# Patient Record
Sex: Female | Born: 1995 | Race: White | Hispanic: No | Marital: Married | Smoking: Never smoker
Health system: Southern US, Community
[De-identification: ages and names within clinical notes are randomized; demographics above are authoritative.]

## PROBLEM LIST (undated history)

## (undated) ENCOUNTER — Inpatient Hospital Stay (HOSPITAL_COMMUNITY): Payer: Self-pay

## (undated) DIAGNOSIS — Z8489 Family history of other specified conditions: Secondary | ICD-10-CM

## (undated) DIAGNOSIS — T8859XA Other complications of anesthesia, initial encounter: Secondary | ICD-10-CM

## (undated) DIAGNOSIS — E782 Mixed hyperlipidemia: Secondary | ICD-10-CM

## (undated) DIAGNOSIS — E669 Obesity, unspecified: Secondary | ICD-10-CM

## (undated) DIAGNOSIS — O24419 Gestational diabetes mellitus in pregnancy, unspecified control: Secondary | ICD-10-CM

## (undated) DIAGNOSIS — R51 Headache: Secondary | ICD-10-CM

## (undated) DIAGNOSIS — Z98891 History of uterine scar from previous surgery: Secondary | ICD-10-CM

## (undated) DIAGNOSIS — N946 Dysmenorrhea, unspecified: Secondary | ICD-10-CM

## (undated) DIAGNOSIS — Z2839 Other underimmunization status: Secondary | ICD-10-CM

## (undated) DIAGNOSIS — G43909 Migraine, unspecified, not intractable, without status migrainosus: Secondary | ICD-10-CM

## (undated) DIAGNOSIS — B350 Tinea barbae and tinea capitis: Secondary | ICD-10-CM

## (undated) DIAGNOSIS — K219 Gastro-esophageal reflux disease without esophagitis: Secondary | ICD-10-CM

## (undated) DIAGNOSIS — J189 Pneumonia, unspecified organism: Secondary | ICD-10-CM

## (undated) DIAGNOSIS — T4145XA Adverse effect of unspecified anesthetic, initial encounter: Secondary | ICD-10-CM

## (undated) DIAGNOSIS — R011 Cardiac murmur, unspecified: Secondary | ICD-10-CM

## (undated) DIAGNOSIS — B019 Varicella without complication: Secondary | ICD-10-CM

## (undated) DIAGNOSIS — K8050 Calculus of bile duct without cholangitis or cholecystitis without obstruction: Secondary | ICD-10-CM

## (undated) DIAGNOSIS — O09899 Supervision of other high risk pregnancies, unspecified trimester: Secondary | ICD-10-CM

## (undated) HISTORY — DX: Gastro-esophageal reflux disease without esophagitis: K21.9

## (undated) HISTORY — PX: ADENOIDECTOMY: SUR15

## (undated) HISTORY — DX: Cardiac murmur, unspecified: R01.1

## (undated) HISTORY — PX: WISDOM TOOTH EXTRACTION: SHX21

## (undated) HISTORY — PX: OTHER SURGICAL HISTORY: SHX169

---
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
## 2015-03-13 ENCOUNTER — Encounter (HOSPITAL_COMMUNITY): Payer: Self-pay | Admitting: *Deleted

## 2015-03-13 ENCOUNTER — Emergency Department (HOSPITAL_COMMUNITY)
Admission: EM | Admit: 2015-03-13 | Discharge: 2015-03-14 | Disposition: A | Discharge status: Home / Self Care | Payer: Medicaid Other | Attending: Emergency Medicine | Admitting: Emergency Medicine

## 2015-03-13 DIAGNOSIS — Z8701 Personal history of pneumonia (recurrent): Secondary | ICD-10-CM | POA: Insufficient documentation

## 2015-03-13 DIAGNOSIS — Z880 Allergy status to penicillin: Secondary | ICD-10-CM | POA: Insufficient documentation

## 2015-03-13 DIAGNOSIS — W228XXA Striking against or struck by other objects, initial encounter: Secondary | ICD-10-CM | POA: Insufficient documentation

## 2015-03-13 DIAGNOSIS — Y9389 Activity, other specified: Secondary | ICD-10-CM | POA: Diagnosis not present

## 2015-03-13 DIAGNOSIS — Z8619 Personal history of other infectious and parasitic diseases: Secondary | ICD-10-CM | POA: Diagnosis not present

## 2015-03-13 DIAGNOSIS — Y9289 Other specified places as the place of occurrence of the external cause: Secondary | ICD-10-CM | POA: Insufficient documentation

## 2015-03-13 DIAGNOSIS — Z9622 Myringotomy tube(s) status: Secondary | ICD-10-CM | POA: Insufficient documentation

## 2015-03-13 DIAGNOSIS — Z79899 Other long term (current) drug therapy: Secondary | ICD-10-CM | POA: Diagnosis not present

## 2015-03-13 DIAGNOSIS — K219 Gastro-esophageal reflux disease without esophagitis: Secondary | ICD-10-CM | POA: Diagnosis not present

## 2015-03-13 DIAGNOSIS — H7291 Unspecified perforation of tympanic membrane, right ear: Secondary | ICD-10-CM | POA: Insufficient documentation

## 2015-03-13 DIAGNOSIS — Y998 Other external cause status: Secondary | ICD-10-CM | POA: Insufficient documentation

## 2015-03-13 DIAGNOSIS — S09301A Unspecified injury of right middle and inner ear, initial encounter: Secondary | ICD-10-CM | POA: Diagnosis present

## 2015-03-13 NOTE — ED Provider Notes (Signed)
CSN: [id number]     Arrival date & time 03/13/15  2253 History  By signing my name below, I, Doreatha Martin, attest that this documentation has been prepared under the direction and in the presence of Azalia Bilis, MD. Electronically Signed: Doreatha Martin, ED Scribe. 03/13/2015. 11:48 PM.    Chief Complaint  Patient presents with  . Ear Injury   The history is provided by the patient. No language interpreter was used.    HPI Comments: Martha Herrera is a 19 y.o. female who presents to the Emergency Department complaining of an injury to the right ear that occurred tonight. Pt states she was using a q-tip when she accidentally hit it with her hand, causing it to hit her ear drum. She states associated HA, right-sided otalgia, decreased hearing on the right. No other injuries.   Past Medical History  Diagnosis Date  . Gastroesophageal reflux   . Varicella   . Headache(784.0)     not as often  . Pneumonia 2013   Past Surgical History  Procedure Laterality Date  . Tubes in ears    . Wisdom tooth extraction    . Esophagogastroduodenoscopy  05/03/2012    Procedure: ESOPHAGOGASTRODUODENOSCOPY (EGD);  Surgeon: Jon Gills, MD;  Location: [facility] OR;  Service: Gastroenterology;  Laterality: N/A;   Family History  Problem Relation Age of Onset  . GER disease Father   . Asthma Brother   . Miscarriages / [place] Brother   . GER disease Maternal Aunt   . Migraines Maternal Aunt   . Anxiety disorder Maternal Aunt   . GER disease Maternal Grandmother   . Hyperlipidemia Maternal Grandmother   . ADD / ADHD Brother   . Miscarriages / [place] Mother   . Headache Mother   . Diabetes Paternal Aunt   . Cancer Maternal Grandfather   . Diabetes Maternal Grandfather   . Hyperlipidemia Maternal Grandfather   . Vision loss Maternal Grandfather   . Ataxia Neg Hx   . Chorea Neg Hx   . Dementia Neg Hx   . Mental retardation Neg Hx   . Multiple sclerosis Neg Hx   . Neurofibromatosis Neg Hx   .  Neuropathy Neg Hx   . Parkinsonism Neg Hx   . Seizures Neg Hx   . Stroke Neg Hx   . Depression Neg Hx   . Bipolar disorder Neg Hx    Social History  Substance Use Topics  . Smoking status: Never Smoker   . Smokeless tobacco: Never Used  . Alcohol Use: No   OB History    No data available     Review of Systems A complete 10 system review of systems was obtained and all systems are negative except as noted in the HPI and PMH.    Allergies  Penicillins  Home Medications   Prior to Admission medications   Medication Sig Start Date End Date Taking? Authorizing Provider  acetaminophen (TYLENOL) 500 MG tablet Take 1,000 mg by mouth every 6 (six) hours as needed.    Historical Provider, MD  Cholecalciferol 5000 UNITS capsule Take 5,000 Units by mouth daily.    Historical Provider, MD  diclofenac (VOLTAREN) 75 MG EC tablet Take 1 tablet (75 mg total) by mouth 2 (two) times daily. Patient not taking: Reported on 06/22/2014 02/02/14   Burgess Amor, PA-C  esomeprazole (NEXIUM) 20 MG capsule Take 20 mg by mouth daily before breakfast.    Historical Provider, MD  ibuprofen (ADVIL,MOTRIN) 200 MG tablet Take  200 mg by mouth every 6 (six) hours as needed for pain.    Historical Provider, MD  Magnesium Oxide 500 MG TABS Take by mouth.    Historical Provider, MD  norelgestromin-ethinyl estradiol (ORTHO EVRA) 150-35 MCG/24HR transdermal patch Place 1 patch onto the skin once a week. Then leave off for 1 week to have period 09/22/14   Cheral MarkerKimberly R Booker, CNM  pantoprazole (PROTONIX) 40 MG tablet Take 40 mg by mouth daily.    Historical Provider, MD  propranolol (INDERAL) 20 MG tablet Take 1 tablet (20 mg total) by mouth 2 (two) times daily. Patient not taking: Reported on 06/22/2014 07/16/13   Keturah Shaverseza Nabizadeh, MD  riboflavin (VITAMIN B-2) 100 MG TABS tablet Take 100 mg by mouth daily.    Historical Provider, MD  solifenacin (VESICARE) 10 MG tablet Take by mouth daily.    Historical Provider, MD  Vitamin D,  Ergocalciferol, (DRISDOL) 50000 UNITS CAPS capsule TAKE ONE CAPSULE BY MOUTH ONCE A WEEK 11/16/14   Historical Provider, MD   BP 134/80 mmHg  Pulse 88  Temp(Src) 98 F (36.7 C) (Oral)  Resp 18  Ht 4\' 10"  (1.473 m)  Wt 150 lb (68.04 kg)  BMI 31.36 kg/m2  SpO2 95%  LMP 03/06/2015 Physical Exam  Constitutional: She is oriented to person, place, and time. She appears well-developed and well-nourished.  HENT:  Head: Normocephalic.  Right Ear: Tympanic membrane is perforated.  Left Ear: Hearing, tympanic membrane, external ear and ear canal normal.  No active bleeding in the right ear. Punctate and perforation in the right TM.    Eyes: EOM are normal.  Neck: Normal range of motion.  Pulmonary/Chest: Effort normal.  Abdominal: She exhibits no distension.  Musculoskeletal: Normal range of motion.  Neurological: She is alert and oriented to person, place, and time.  Psychiatric: She has a normal mood and affect.  Nursing note and vitals reviewed.  ED Course  Procedures (including critical care time) DIAGNOSTIC STUDIES: Oxygen Saturation is 95% on RA, adequate by my interpretation.    COORDINATION OF CARE: 11:46 PM Discussed treatment plan with pt at bedside and pt agreed to plan.   MDM   Final diagnoses:  Tympanic membrane perforation, right    Ruptured right TM. Audiology and ENT follow up. Told to not submerge head and told to wear a waterproof ear plug when showering  I personally performed the services described in this documentation, which was scribed in my presence. The recorded information has been reviewed and is accurate.       Azalia BilisKevin Nikol Lemar, MD 03/14/15 [phone]

## 2015-03-13 NOTE — Discharge Instructions (Signed)
Eardrum Perforation  An eardrum perforation is a puncture or tear in the eardrum. This is also called a ruptured eardrum. The eardrum is a thin, round tissue inside of your ear that separates your ear canal from your middle ear. This is the tissue that detects sound and enables you to hear. An eardrum perforation can cause discomfort and hearing loss. In most cases, the eardrum will heal without treatment and with little or no permanent hearing loss.  CAUSES  An eardrum perforation can result from different causes, including:  · Sudden pressure changes that happen in situations such as scuba diving or flying in an airplane.  · Foreign objects in the ear.  · Inserting a cotton-tipped swab or any blunt object into the ear.  · Loud noise.  · Trauma to the ear.  · Attempting to remove an object from the ear.  SIGNS AND SYMPTOMS  · Hearing loss.  · Ear pain.  · Ringing in the ear.  · Discharge or bleeding from the ear.  · Dizziness.  · Vomiting.  · Facial paralysis.  DIAGNOSIS   Your health care provider will examine your ear using a tool called an otoscope. This tool allows the health care provider to see into your ear to examine your eardrum. Various types of hearing tests may also be done.  TREATMENT   Typically, the eardrum will heal on its own within a few weeks. If your eardrum does not heal, your health care provider may recommend one of the following treatments:  · A procedure to place a patch over your eardrum.  · Surgery to repair your eardrum.  HOME CARE INSTRUCTIONS   · Keep your ear dry. This will improve healing. Do not submerge your head under water until healing is complete. Do not swim or dive until your health care provider approves. While bathing or showering, protect your ear using one of these methods:    Using a waterproof earplug.    Covering a piece of cotton with petroleum jelly and placing it in your outer ear canal.  · Take medicines only as directed by your health care provider.  · Avoid  blowing your nose if possible. If you blow your nose, do it gently. Forceful blowing increases the pressure in your middle ear. This may cause further injury or may delay your healing.  · Resume your normal activities after the perforation has healed. Your health care provider can tell you when this has occurred.  · Talk to your health care provider before you fly on an airplane. Air travel is generally allowed with a perforated eardrum.  · Keep all follow-up visits as directed by your health care provider. This is important.  SEEK MEDICAL CARE IF:  · You have a fever.  SEEK IMMEDIATE MEDICAL CARE IF:  · You have blood or pus coming from your ear.  · You have dizziness or problems with balance.  · You have nausea or vomiting.  · You have increased pain.     This information is not intended to replace advice given to you by your health care provider. Make sure you discuss any questions you have with your health care provider.     Document Released: 04/14/2000 Document Revised: 05/08/2014 Document Reviewed: 11/24/2013  Elsevier Interactive Patient Education ©2016 [employer]

## 2015-03-13 NOTE — ED Notes (Signed)
Pt was cleaning her ear out and poked the q-tip in her ear and now her ear is bleeding.

## 2015-05-13 ENCOUNTER — Ambulatory Visit (INDEPENDENT_AMBULATORY_CARE_PROVIDER_SITE_OTHER): Payer: Medicaid Other | Admitting: Otolaryngology

## 2015-05-13 DIAGNOSIS — H93293 Other abnormal auditory perceptions, bilateral: Secondary | ICD-10-CM | POA: Diagnosis not present

## 2015-08-21 ENCOUNTER — Encounter (HOSPITAL_COMMUNITY): Payer: Self-pay

## 2015-08-21 ENCOUNTER — Emergency Department (HOSPITAL_COMMUNITY)
Admission: EM | Admit: 2015-08-21 | Discharge: 2015-08-21 | Disposition: A | Discharge status: Home / Self Care | Payer: Medicaid Other | Attending: Emergency Medicine | Admitting: Emergency Medicine

## 2015-08-21 DIAGNOSIS — L259 Unspecified contact dermatitis, unspecified cause: Secondary | ICD-10-CM | POA: Diagnosis not present

## 2015-08-21 DIAGNOSIS — R21 Rash and other nonspecific skin eruption: Secondary | ICD-10-CM | POA: Diagnosis present

## 2015-08-21 MED ORDER — DIPHENHYDRAMINE HCL 25 MG PO TABS: 25.0000 mg | ORAL_TABLET | Freq: Four times a day (QID) | ORAL | Status: DC

## 2015-08-21 MED ORDER — PREDNISONE 10 MG PO TABS: ORAL_TABLET | ORAL | Status: DC

## 2015-08-21 MED ORDER — DIPHENHYDRAMINE HCL 25 MG PO CAPS
25.0000 mg | ORAL_CAPSULE | Freq: Once | ORAL | Status: AC
  Administered 2015-08-21: 25 mg via ORAL
  Filled 2015-08-21: qty 1

## 2015-08-21 MED ORDER — TRIAMCINOLONE ACETONIDE 0.1 % EX CREA
1.0000 | TOPICAL_CREAM | Freq: Two times a day (BID) | CUTANEOUS | Status: DC
Start: 2015-08-21 — End: 2016-10-24

## 2015-08-21 MED ORDER — PREDNISONE 50 MG PO TABS
60.0000 mg | ORAL_TABLET | Freq: Once | ORAL | Status: AC
  Administered 2015-08-21: 60 mg via ORAL
  Filled 2015-08-21: qty 1

## 2015-08-21 NOTE — ED Provider Notes (Signed)
CSN: [id number]     Arrival date & time 08/21/15  2300 History   First MD Initiated Contact with Patient 08/21/15 2316     Chief Complaint  Patient presents with  . Rash     (Consider location/radiation/quality/duration/timing/severity/associated sxs/prior Treatment) Patient is a 20 y.o. female presenting with rash. The history is provided by the patient.  Rash Location:  Shoulder/arm and torso Shoulder/arm rash location:  R arm Torso rash location:  R chest Quality: blistering, itchiness and redness   Severity:  Mild Onset quality:  Gradual Duration:  4 days Timing:  Intermittent Progression:  Unchanged Context: plant contact   Relieved by:  Nothing Worsened by:  Nothing tried Associated symptoms: no hoarse voice, no nausea, no shortness of breath, no throat swelling, no tongue swelling and not wheezing     Past Medical History  Diagnosis Date  . Gastroesophageal reflux   . Varicella   . Headache(784.0)     not as often  . Pneumonia 2013   Past Surgical History  Procedure Laterality Date  . Tubes in ears    . Wisdom tooth extraction    . Esophagogastroduodenoscopy  05/03/2012    Procedure: ESOPHAGOGASTRODUODENOSCOPY (EGD);  Surgeon: Jon Gills, MD;  Location: [facility] OR;  Service: Gastroenterology;  Laterality: N/A;   Family History  Problem Relation Age of Onset  . GER disease Father   . Asthma Brother   . Miscarriages / [place] Brother   . GER disease Maternal Aunt   . Migraines Maternal Aunt   . Anxiety disorder Maternal Aunt   . GER disease Maternal Grandmother   . Hyperlipidemia Maternal Grandmother   . ADD / ADHD Brother   . Miscarriages / [place] Mother   . Headache Mother   . Diabetes Paternal Aunt   . Cancer Maternal Grandfather   . Diabetes Maternal Grandfather   . Hyperlipidemia Maternal Grandfather   . Vision loss Maternal Grandfather   . Ataxia Neg Hx   . Chorea Neg Hx   . Dementia Neg Hx   . Mental retardation Neg Hx   . Multiple  sclerosis Neg Hx   . Neurofibromatosis Neg Hx   . Neuropathy Neg Hx   . Parkinsonism Neg Hx   . Seizures Neg Hx   . Stroke Neg Hx   . Depression Neg Hx   . Bipolar disorder Neg Hx    Social History  Substance Use Topics  . Smoking status: Never Smoker   . Smokeless tobacco: Never Used  . Alcohol Use: No   OB History    No data available     Review of Systems  HENT: Negative for hoarse voice.   Respiratory: Negative for shortness of breath and wheezing.   Gastrointestinal: Negative for nausea.  Skin: Positive for rash.  All other systems reviewed and are negative.     Allergies  Penicillins and Amoxil  Home Medications   Prior to Admission medications   Medication Sig Start Date End Date Taking? Authorizing Provider  acetaminophen (TYLENOL) 500 MG tablet Take 1,000 mg by mouth every 6 (six) hours as needed.    Historical Provider, MD  Cholecalciferol 5000 UNITS capsule Take 5,000 Units by mouth daily.    Historical Provider, MD  diclofenac (VOLTAREN) 75 MG EC tablet Take 1 tablet (75 mg total) by mouth 2 (two) times daily. Patient not taking: Reported on 06/22/2014 02/02/14   Burgess Amor, PA-C  diphenhydrAMINE (BENADRYL) 25 MG tablet Take 1 tablet (25 mg total) by mouth  every 6 (six) hours. 08/21/15   Ivery QualeHobson Taiana Temkin, PA-C  esomeprazole (NEXIUM) 20 MG capsule Take 20 mg by mouth daily before breakfast.    Historical Provider, MD  ibuprofen (ADVIL,MOTRIN) 200 MG tablet Take 200 mg by mouth every 6 (six) hours as needed for pain.    Historical Provider, MD  Magnesium Oxide 500 MG TABS Take by mouth.    Historical Provider, MD  norelgestromin-ethinyl estradiol (ORTHO EVRA) 150-35 MCG/24HR transdermal patch Place 1 patch onto the skin once a week. Then leave off for 1 week to have period 09/22/14   Cheral MarkerKimberly R Booker, CNM  pantoprazole (PROTONIX) 40 MG tablet Take 40 mg by mouth daily.    Historical Provider, MD  predniSONE (DELTASONE) 10 MG tablet 5,4,3,2,1 - Take with food. 08/21/15    Ivery QualeHobson Clemmie Marxen, PA-C  propranolol (INDERAL) 20 MG tablet Take 1 tablet (20 mg total) by mouth 2 (two) times daily. Patient not taking: Reported on 06/22/2014 07/16/13   Keturah Shaverseza Nabizadeh, MD  riboflavin (VITAMIN B-2) 100 MG TABS tablet Take 100 mg by mouth daily.    Historical Provider, MD  solifenacin (VESICARE) 10 MG tablet Take by mouth daily.    Historical Provider, MD  triamcinolone cream (KENALOG) 0.1 % Apply 1 application topically 2 (two) times daily. 08/21/15   Ivery QualeHobson Jamarkis Branam, PA-C  Vitamin D, Ergocalciferol, (DRISDOL) 50000 UNITS CAPS capsule TAKE ONE CAPSULE BY MOUTH ONCE A WEEK 11/16/14   Historical Provider, MD   BP 118/80 mmHg  Pulse 91  Temp(Src) 98.1 F (36.7 C) (Oral)  Resp 16  Ht 4\' 10"  (1.473 m)  Wt 68.947 kg  BMI 31.78 kg/m2  SpO2 100%  LMP 08/17/2015 (Exact Date) Physical Exam  Constitutional: She is oriented to person, place, and time. She appears well-developed and well-nourished.  Non-toxic appearance.  HENT:  Head: Normocephalic.  Right Ear: Tympanic membrane and external ear normal.  Left Ear: Tympanic membrane and external ear normal.  Eyes: EOM and lids are normal. Pupils are equal, round, and reactive to light.  Neck: Normal range of motion. Neck supple. Carotid bruit is not present.  Cardiovascular: Normal rate, regular rhythm, normal heart sounds, intact distal pulses and normal pulses.   Pulmonary/Chest: Breath sounds normal. No respiratory distress.    Red rash of the right breast. No cluster of blisters. No red streaks. No drainage. No drainage from the right nipple are. Chaperone present during the exam.  Abdominal: Soft. Bowel sounds are normal. There is no tenderness. There is no guarding.  Musculoskeletal: Normal range of motion.  Lymphadenopathy:       Head (right side): No submandibular adenopathy present.       Head (left side): No submandibular adenopathy present.    She has no cervical adenopathy.  Neurological: She is alert and oriented to  person, place, and time. She has normal strength. No cranial nerve deficit or sensory deficit.  Skin: Skin is warm and dry.  Psychiatric: She has a normal mood and affect. Her speech is normal.  Nursing note and vitals reviewed.   ED Course  Procedures (including critical care time) Labs Review Labs Reviewed - No data to display  Imaging Review No results found. I have personally reviewed and evaluated these images and lab results as part of my medical decision-making.   EKG Interpretation None      MDM  Vital signs stable. Exam favors contact dermatitis. No evidence for anaphylaxis.  Rx for Kenalog cream, prednisone and benadryl given to the patient. Pt to  see PCP or return to the ED if any changes or problem.   Final diagnoses:  Contact dermatitis    *I have reviewed nursing notes, vital signs, and all appropriate lab and imaging results for this patient.[address] PA-C 08/23/15 1126  Shon Baton, MD 08/24/15 2259

## 2015-08-21 NOTE — ED Notes (Signed)
Started breaking out in a rash on Tuesday.  Breaking out on arms and on right breast.  Blisters noted to right arm.  Itches per pt.  I have been using neosporin, hydrocortisone cream, calamine lotion, and taking oral benadryl.

## 2015-08-21 NOTE — Discharge Instructions (Signed)

## 2015-10-27 ENCOUNTER — Other Ambulatory Visit (HOSPITAL_COMMUNITY): Payer: Self-pay | Admitting: Physician Assistant

## 2015-10-27 DIAGNOSIS — R102 Pelvic and perineal pain: Secondary | ICD-10-CM

## 2015-10-29 ENCOUNTER — Other Ambulatory Visit (HOSPITAL_COMMUNITY): Payer: Self-pay | Admitting: Physician Assistant

## 2015-10-29 DIAGNOSIS — R102 Pelvic and perineal pain: Secondary | ICD-10-CM

## 2015-11-01 ENCOUNTER — Other Ambulatory Visit (HOSPITAL_COMMUNITY): Payer: Self-pay | Admitting: Physician Assistant

## 2015-11-01 DIAGNOSIS — R102 Pelvic and perineal pain: Secondary | ICD-10-CM

## 2015-11-09 ENCOUNTER — Ambulatory Visit (HOSPITAL_COMMUNITY): Admission: RE | Admit: 2015-11-09 | Payer: Medicaid Other | Source: Ambulatory Visit

## 2016-06-22 ENCOUNTER — Emergency Department (HOSPITAL_COMMUNITY): Payer: 59

## 2016-06-22 ENCOUNTER — Encounter (HOSPITAL_COMMUNITY): Payer: Self-pay

## 2016-06-22 ENCOUNTER — Emergency Department (HOSPITAL_COMMUNITY)
Admission: EM | Admit: 2016-06-22 | Discharge: 2016-06-22 | Disposition: A | Discharge status: Home / Self Care | Payer: 59 | Attending: Emergency Medicine | Admitting: Emergency Medicine

## 2016-06-22 DIAGNOSIS — Z5181 Encounter for therapeutic drug level monitoring: Secondary | ICD-10-CM | POA: Insufficient documentation

## 2016-06-22 DIAGNOSIS — R519 Headache, unspecified: Secondary | ICD-10-CM

## 2016-06-22 DIAGNOSIS — G43909 Migraine, unspecified, not intractable, without status migrainosus: Secondary | ICD-10-CM | POA: Diagnosis present

## 2016-06-22 DIAGNOSIS — R200 Anesthesia of skin: Secondary | ICD-10-CM

## 2016-06-22 DIAGNOSIS — R51 Headache: Secondary | ICD-10-CM

## 2016-06-22 LAB — COMPREHENSIVE METABOLIC PANEL
ALK PHOS: 95 U/L (ref 38–126)
ALT: 19 U/L (ref 14–54)
ANION GAP: 9 (ref 5–15)
AST: 22 U/L (ref 15–41)
Albumin: 4.8 g/dL (ref 3.5–5.0)
BUN: 17 mg/dL (ref 6–20)
CALCIUM: 9.6 mg/dL (ref 8.9–10.3)
CO2: 27 mmol/L (ref 22–32)
Chloride: 101 mmol/L (ref 101–111)
Creatinine, Ser: 0.65 mg/dL (ref 0.44–1.00)
GFR calc Af Amer: >60 mL/min (ref >60)
GFR calc non Af Amer: >60 mL/min (ref >60)
Glucose, Bld: 114 mg/dL — ABNORMAL HIGH (ref 65–99)
Potassium: 3.5 mmol/L (ref 3.5–5.1)
SODIUM: 137 mmol/L (ref 135–145)
TOTAL PROTEIN: 8 g/dL (ref 6.5–8.1)
Total Bilirubin: 0.5 mg/dL (ref 0.3–1.2)

## 2016-06-22 LAB — CBC WITH DIFFERENTIAL/PLATELET
Basophils Absolute: 0 10*3/uL (ref 0.0–0.1)
Basophils Relative: 0 %
EOS ABS: 0 10*3/uL (ref 0.0–0.7)
EOS PCT: 0 %
HCT: 44.6 % (ref 36.0–46.0)
HEMOGLOBIN: 15.4 g/dL — AB (ref 12.0–15.0)
LYMPHS ABS: 2.3 10*3/uL (ref 0.7–4.0)
Lymphocytes Relative: 15 %
MCH: 28.9 pg (ref 26.0–34.0)
MCHC: 34.5 g/dL (ref 30.0–36.0)
MCV: 83.7 fL (ref 78.0–100.0)
MONO ABS: 0.5 10*3/uL (ref 0.1–1.0)
MONOS PCT: 3 %
NEUTROS PCT: 82 %
Neutro Abs: 13 10*3/uL — ABNORMAL HIGH (ref 1.7–7.7)
Platelets: 319 10*3/uL (ref 150–400)
RBC: 5.33 MIL/uL — ABNORMAL HIGH (ref 3.87–5.11)
RDW: 12.4 % (ref 11.5–15.5)
WBC: 15.8 10*3/uL — ABNORMAL HIGH (ref 4.0–10.5)

## 2016-06-22 LAB — PROTIME-INR
INR: 0.98
Prothrombin Time: 13 seconds (ref 11.4–15.2)

## 2016-06-22 LAB — APTT: aPTT: 29 seconds (ref 24–36)

## 2016-06-22 MED ORDER — DIPHENHYDRAMINE HCL 50 MG/ML IJ SOLN
25.0000 mg | Freq: Once | INTRAMUSCULAR | Status: AC
  Administered 2016-06-22: 25 mg via INTRAVENOUS
  Filled 2016-06-22: qty 1

## 2016-06-22 MED ORDER — SODIUM CHLORIDE 0.9 % IV BOLUS (SEPSIS)
1000.0000 mL | Freq: Once | INTRAVENOUS | Status: AC
  Administered 2016-06-22: 1000 mL via INTRAVENOUS

## 2016-06-22 MED ORDER — METOCLOPRAMIDE HCL 5 MG/ML IJ SOLN
10.0000 mg | Freq: Once | INTRAMUSCULAR | Status: AC
  Administered 2016-06-22: 10 mg via INTRAVENOUS
  Filled 2016-06-22: qty 2

## 2016-06-22 NOTE — ED Notes (Signed)
Pt c/o itching to right eye and states that she sees " a black line" on right outside field of vision, denies any injury, pt and family updated on plan of care,

## 2016-06-22 NOTE — ED Notes (Signed)
Pt and family updated on plan of care,  

## 2016-06-22 NOTE — Discharge Instructions (Signed)
Your numbness tonight was from a complicated migraine episode. Your MR tests were all normal, no strokes, blood clots, abnormal blood vessels or aneurysms.  You should see a neurologist to get on medications to control your migraine headaches. Look at the migraine information sheet to see if they are things in your diet or your environment that you can avoid to help less the migraine attacks.

## 2016-06-22 NOTE — ED Notes (Signed)
SOC notified 

## 2016-06-22 NOTE — ED Notes (Signed)
ED Provider at bedside. 

## 2016-06-22 NOTE — ED Triage Notes (Signed)
Pt states she got off of work approx 2230 and while driving her vision was blurry and has periods of 'darkness" also with pain in right eye.

## 2016-06-22 NOTE — ED Provider Notes (Signed)
[facility]-EMERGENCY DEPT Provider Note   CSN: [id number] Arrival date & time: 06/22/16  0003  Time seen 04:30 AM   History   Chief Complaint Chief Complaint  Patient presents with  . Blurred Vision    HPI Martha Herrera is a 21 y.o. female.  HPI  patient states she worked at [employer] last night and got off work about 10:30 PM. She states she was wearing her glasses. As she was leaving she looked at her phone and she states the letter saw on her phone were blurred. She states she has blurred vision that comes and goes however this seems worse. She also states there is a small black dot that she sees in her right eye that's been there for a month. She states she tried to drive home but she couldn't see to drive her boyfriend drove her home. She states her vision got worse and she can only see colors. She also reports a right-sided headache behind her right eye that started about 11 PM. She states she gets daily headaches. This is like headache she's had before however this time she also reports some numbness in her left face, left arm, left leg, her left tongue that lasted about 10-20 minutes. She denies any difficulty speaking or walking during this episode. She denies been on birth control pills. She reports the numbness is totally gone now. She still has a headache.  She reports family history and a maternal aunt and a sister of headaches, there is no family history of strokes.  PCP none  Past Medical History:  Diagnosis Date  . Gastroesophageal reflux   . Headache(784.0)    not as often  . Pneumonia 2013  . Varicella     Patient Active Problem List   Diagnosis Date Noted  . Tension headache 01/13/2013  . Migraine without aura 01/13/2013  . 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 Right sided abdominal pain 03/12/2012  . Substernal chest pain 03/12/2012  . Gastroesophageal reflux     Past Surgical History:  Procedure Laterality Date  . ESOPHAGOGASTRODUODENOSCOPY  05/03/2012   Procedure: ESOPHAGOGASTRODUODENOSCOPY (EGD);  Surgeon: Jon Gills, MD;  Location: [facility] OR;  Service: Gastroenterology;  Laterality: N/A;  . tubes in ears    . WISDOM TOOTH EXTRACTION      OB History    No data available       Home Medications    Prior to Admission medications   Medication Sig Start Date End Date Taking? Authorizing Provider  acetaminophen (TYLENOL) 500 MG tablet Take 1,000 mg by mouth every 6 (six) hours as needed.   Yes Historical Provider, MD  Cholecalciferol 5000 UNITS capsule Take 5,000 Units by mouth daily.    Historical Provider, MD  diclofenac (VOLTAREN) 75 MG EC tablet Take 1 tablet (75 mg total) by mouth 2 (two) times daily. Patient not taking: Reported on 06/22/2014 02/02/14   Burgess Amor, PA-C  diphenhydrAMINE (BENADRYL) 25 MG tablet Take 1 tablet (25 mg total) by mouth every 6 (six) hours. 08/21/15   Ivery Quale, PA-C  esomeprazole (NEXIUM) 20 MG capsule Take 20 mg by mouth daily before breakfast.    Historical Provider, MD  ibuprofen (ADVIL,MOTRIN) 200 MG tablet Take 200 mg by mouth every 6 (six) hours as needed for pain.    Historical Provider, MD  Magnesium Oxide 500 MG TABS Take by mouth.    Historical Provider, MD  norelgestromin-ethinyl estradiol (ORTHO EVRA) 150-35 MCG/24HR transdermal patch Place 1 patch onto the skin once  a week. Then leave off for 1 week to have period 09/22/14   Cheral MarkerKimberly R Booker, CNM  pantoprazole (PROTONIX) 40 MG tablet Take 40 mg by mouth daily.    Historical Provider, MD  predniSONE (DELTASONE) 10 MG tablet 5,4,3,2,1 - Take with food. 08/21/15   Ivery QualeHobson Bryant, PA-C  propranolol (INDERAL) 20 MG tablet Take 1 tablet (20 mg total) by mouth 2 (two) times daily. Patient not taking: Reported on 06/22/2014 07/16/13   Keturah Shaverseza Nabizadeh, MD  riboflavin (VITAMIN B-2) 100 MG TABS tablet Take 100 mg by mouth daily.    Historical Provider, MD  solifenacin (VESICARE) 10 MG tablet Take by mouth daily.    Historical Provider, MD  triamcinolone cream  (KENALOG) 0.1 % Apply 1 application topically 2 (two) times daily. 08/21/15   Ivery QualeHobson Bryant, PA-C  Vitamin D, Ergocalciferol, (DRISDOL) 50000 UNITS CAPS capsule TAKE ONE CAPSULE BY MOUTH ONCE A WEEK 11/16/14   Historical Provider, MD    Family History Family History  Problem Relation Age of Onset  . GER disease Father   . Asthma Brother   . Miscarriages / [place] Brother   . GER disease Maternal Grandmother   . Hyperlipidemia Maternal Grandmother   . ADD / ADHD Brother   . Miscarriages / [place] Mother   . Headache Mother   . Cancer Maternal Grandfather   . Diabetes Maternal Grandfather   . Hyperlipidemia Maternal Grandfather   . Vision loss Maternal Grandfather   . GER disease Maternal Aunt   . Migraines Maternal Aunt   . Anxiety disorder Maternal Aunt   . Diabetes Paternal Aunt   . Ataxia Neg Hx   . Chorea Neg Hx   . Dementia Neg Hx   . Mental retardation Neg Hx   . Multiple sclerosis Neg Hx   . Neurofibromatosis Neg Hx   . Neuropathy Neg Hx   . Parkinsonism Neg Hx   . Seizures Neg Hx   . Stroke Neg Hx   . Depression Neg Hx   . Bipolar disorder Neg Hx     Social History Social History  Substance Use Topics  . Smoking status: Never Smoker  . Smokeless tobacco: Never Used  . Alcohol use No  employed   Allergies   Penicillins and Amoxil [amoxicillin]   Review of Systems Review of Systems  All other systems reviewed and are negative.    Physical Exam Updated Vital Signs BP 108/76   Pulse 75   Temp 98 F (36.7 C) (Oral)   Resp 20   Ht 4\' 10"  (1.473 m)   Wt 162 lb (73.5 kg)   LMP 06/09/2016   SpO2 98%   BMI 33.86 kg/m   Vital signs normal    Physical Exam  Constitutional: She is oriented to person, place, and time. She appears well-developed and well-nourished.  Non-toxic appearance. She does not appear ill. No distress.  HENT:  Head: Normocephalic and atraumatic.  Right Ear: External ear normal.  Left Ear: External ear normal.  Nose:  Nose normal. No mucosal edema or rhinorrhea.  Mouth/Throat: Oropharynx is clear and moist and mucous membranes are normal. No dental abscesses or uvula swelling.  Eyes: Conjunctivae and EOM are normal. Pupils are equal, round, and reactive to light.  Neck: Normal range of motion and full passive range of motion without pain. Neck supple.  Cardiovascular: Normal rate, regular rhythm and normal heart sounds.  Exam reveals no gallop and no friction rub.   No murmur heard. Pulmonary/Chest: Effort normal  and breath sounds normal. No respiratory distress. She has no wheezes. She has no rhonchi. She has no rales. She exhibits no tenderness and no crepitus.  Abdominal: Soft. Normal appearance and bowel sounds are normal. She exhibits no distension. There is no tenderness. There is no rebound and no guarding.  Musculoskeletal: Normal range of motion. She exhibits no edema or tenderness.  Moves all extremities well.   Neurological: She is alert and oriented to person, place, and time. She has normal strength. No cranial nerve deficit.  Sensation is intact to light touch bilaterally, she does not have pronator drift, grips are equal, there is no lower motor weakness noted.  Skin: Skin is warm, dry and intact. No rash noted. No erythema. No pallor.  Psychiatric: She has a normal mood and affect. Her speech is normal and behavior is normal. Her mood appears not anxious.  Nursing note and vitals reviewed.    ED Treatments / Results  Labs (all labs ordered are listed, but only abnormal results are displayed) Results for orders placed or performed during the hospital encounter of 06/22/16  Protime-INR  Result Value Ref Range   Prothrombin Time 13.0 11.4 - 15.2 seconds   INR 0.98   CBC with Differential  Result Value Ref Range   WBC 15.8 (H) 4.0 - 10.5 K/uL   RBC 5.33 (H) 3.87 - 5.11 MIL/uL   Hemoglobin 15.4 (H) 12.0 - 15.0 g/dL   HCT 16.1 09.6 - 04.5 %   MCV 83.7 78.0 - 100.0 fL   MCH 28.9 26.0 -  34.0 pg   MCHC 34.5 30.0 - 36.0 g/dL   RDW 40.9 81.1 - 91.4 %   Platelets 319 150 - 400 K/uL   Neutrophils Relative % 82 %   Neutro Abs 13.0 (H) 1.7 - 7.7 K/uL   Lymphocytes Relative 15 %   Lymphs Abs 2.3 0.7 - 4.0 K/uL   Monocytes Relative 3 %   Monocytes Absolute 0.5 0.1 - 1.0 K/uL   Eosinophils Relative 0 %   Eosinophils Absolute 0.0 0.0 - 0.7 K/uL   Basophils Relative 0 %   Basophils Absolute 0.0 0.0 - 0.1 K/uL  Comprehensive metabolic panel  Result Value Ref Range   Sodium 137 135 - 145 mmol/L   Potassium 3.5 3.5 - 5.1 mmol/L   Chloride 101 101 - 111 mmol/L   CO2 27 22 - 32 mmol/L   Glucose, Bld 114 (H) 65 - 99 mg/dL   BUN 17 6 - 20 mg/dL   Creatinine, Ser 7.82 0.44 - 1.00 mg/dL   Calcium 9.6 8.9 - 95.6 mg/dL   Total Protein 8.0 6.5 - 8.1 g/dL   Albumin 4.8 3.5 - 5.0 g/dL   AST 22 15 - 41 U/L   ALT 19 14 - 54 U/L   Alkaline Phosphatase 95 38 - 126 U/L   Total Bilirubin 0.5 0.3 - 1.2 mg/dL   GFR calc non Af Amer >60 >60 mL/min   GFR calc Af Amer >60 >60 mL/min   Anion gap 9 5 - 15  APTT  Result Value Ref Range   aPTT 29 24 - 36 seconds    Laboratory interpretation all normal except Leukocytosis and mild elevation hemoglobin suspicious for dehydration   EKG  EKG Interpretation None       Radiology Ct Head Wo Contrast  Result Date: 06/22/2016 CLINICAL DATA:  Blurry vision and right-sided headache. EXAM: CT HEAD WITHOUT CONTRAST TECHNIQUE: Contiguous axial images were obtained from the base  of the skull through the vertex without intravenous contrast. COMPARISON:  None. FINDINGS: Brain: No mass lesion, intraparenchymal hemorrhage or extra-axial collection. No evidence of acute cortical infarct. Brain parenchyma and CSF-containing spaces are normal for age. Vascular: No hyperdense vessel or unexpected calcification. Skull: Normal visualized skull base, calvarium and extracranial soft tissues. Sinuses/Orbits: No sinus fluid levels or advanced mucosal thickening. No  mastoid effusion. Normal orbits. IMPRESSION: Normal head CT. Electronically Signed   By: Deatra Robinson M.D.   On: 06/22/2016 05:21   Mr Angiogram Head Wo Contrast  Result Date: 06/22/2016 CLINICAL DATA:  New onset headache and left-sided numbness. EXAM: MRA HEAD WITHOUT CONTRAST TECHNIQUE: Angiographic images of the Circle of Willis were obtained using MRA technique without intravenous contrast. COMPARISON:  MRI brain from the same day. CT head from the same day. FINDINGS: The internal carotid arteries are within normal limits from the high cervical segments through the ICA termini bilaterally. The A1 and M1 segments are normal. The anterior communicating artery is patent MCA bifurcations are intact. ACA and MCA branch vessels are unremarkable. The right vertebral artery is slightly dominant the left. The left PICA origin below the field of view. The right PICA origin is visualized and normal. The vertebrobasilar junction and basilar artery are normal the both posterior cerebral arteries originate from the basilar tip. The PCA branch vessels are within normal limits. IMPRESSION: 1. Normal MRA circle of Willis without significant proximal stenosis, aneurysm, or branch vessel occlusion. Electronically Signed   By: Marin Roberts M.D.   On: 06/22/2016 07:43   Mr Brain Wo Contrast  Result Date: 06/22/2016 CLINICAL DATA:  New onset headache and left-sided numbness. EXAM: MRI HEAD WITHOUT CONTRAST TECHNIQUE: Multiplanar, multiecho pulse sequences of the brain and surrounding structures were obtained without intravenous contrast. COMPARISON:  MRA and MR venogram of the same day. FINDINGS: Brain: No acute infarct, hemorrhage, or mass lesion is present. The ventricles are of normal size. No significant extraaxial fluid collection is present. No significant white matter disease is present. The internal auditory canals are normal. Brainstem and cerebellum are unremarkable. Vascular: Flow is present in the major  intracranial arteries. Skull and upper cervical spine: The skullbase is within normal limits. 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 There is no obstruction. The craniocervical junction is normal. Midline sagittal structures are within normal limits for age. Sinuses/Orbits: The paranasal sinuses and mastoid air cells are clear. The globes and orbits are within normal limits. IMPRESSION: Normal MRI of the brain. Electronically Signed   By: Marin Roberts M.D.   On: 06/22/2016 07:46   Mr Mrv Head Wo Cm  Result Date: 06/22/2016 CLINICAL DATA:  New onset headache and left-sided numbness. EXAM: MR VENOGRAM the HEAD WITHOUT CONTRAST TECHNIQUE: Angiographic images of the intracranial venous structures were obtained using MRV technique without intravenous contrast. COMPARISON:  MRI brain the same day. FINDINGS: The dural sinuses are patent. The left transverse sinus is dominant. The straight sinus and deep cerebral veins are intact. Cortical veins are within normal limits. IMPRESSION: The negative MR venogram. Electronically Signed   By: Marin Roberts M.D.   On: 06/22/2016 07:41    Procedures Procedures (including critical care time)  Medications Ordered in ED Medications  sodium chloride 0.9 % bolus 1,000 mL (0 mLs Intravenous Stopped 06/22/16 0612)  metoCLOPramide (REGLAN) injection 10 mg (10 mg Intravenous Given 06/22/16 0455)  diphenhydrAMINE (BENADRYL) injection 25 mg (25 mg Intravenous Given 06/22/16 0455)     Initial Impression / Assessment  and Plan / ED Course  I have reviewed the triage vital signs and the nursing notes.  Pertinent labs & imaging results that were available during my care of the patient were reviewed by me and considered in my medical decision making (see chart for details).  When I saw the patient I was suspicious that she was having a migraine or complicated migraine with the numbness. She has a long history of headaches. I asked for a tell a neurology consult since  she did have the numbness. She is not at risk for stroke and that she is not on birth control pills.  05:40  SOC, neurologist Dr Berdie Ogren, recommends MRI/MRA/Venogram of brain without contrast and if normal, discharge with dx of migraine.   At time of discharge patient's headache is gone. She was given her MRI results. We discussed follow-up with neurology for further treatment of her migraines.  Final Clinical Impressions(s) / ED Diagnoses   Final diagnoses:  Numbness on left side  Headache  Migraine without status migrainosus, not intractable, unspecified migraine type    Plan discharge  Devoria Albe, MD, Concha Pyo, MD 06/22/16 [phone]

## 2016-06-22 NOTE — ED Notes (Signed)
Pt in MRI.

## 2016-06-22 NOTE — ED Notes (Signed)
Pt C/O left sided tingling. No deficits noted. MD notified.

## 2016-06-22 NOTE — ED Notes (Signed)
Pt states that she is feeling better, requesting to go home, RN advised pt that the testing that she needs would be able to be performed this am, pt agrees to stay after discussion with family and RN, pt provided warm blankets, light dimmed for comfort,

## 2016-10-24 ENCOUNTER — Encounter: Payer: Self-pay | Admitting: Women's Health

## 2016-10-24 ENCOUNTER — Ambulatory Visit (INDEPENDENT_AMBULATORY_CARE_PROVIDER_SITE_OTHER): Payer: 59 | Admitting: Women's Health

## 2016-10-24 VITALS — BP 130/86 | HR 95 | Wt 170.0 lb

## 2016-10-24 DIAGNOSIS — R102 Pelvic and perineal pain: Secondary | ICD-10-CM | POA: Diagnosis not present

## 2016-10-24 LAB — POCT WET PREP (WET MOUNT)
Clue Cells Wet Prep Whiff POC: NEGATIVE
TRICHOMONAS WET PREP HPF POC: ABSENT

## 2016-10-24 MED ORDER — METRONIDAZOLE 500 MG PO TABS: 500.0000 mg | ORAL_TABLET | Freq: Two times a day (BID) | ORAL | 0 refills | Status: DC

## 2016-10-24 NOTE — Progress Notes (Signed)
   [facility] Visit  Patient name: Martha Herrera MRN [id number]  Date of birth: 01/28/1996  CC & HPI:  Martha Herrera is a 21 y.o. G0P0000 Caucasian female presenting today for report of intermittent pelvic pain x ~4962yr. Has been off of ortho evra patches for about 4562yr or so. Periods have been regular ever since, only bleeds 2-3d, no cramping. Denies abnormal d/c, itching/odor/irritation. Is sexually active, uses condoms for contraception.  Pain is lower pelvic, sharp/shooting/stabbing- will last for a few days then go away.  Patient's last menstrual period was 09/28/2016 (exact date). The current method of family planning is condoms Last pap- needs, hasn't gotten pap yet since turning 21yo 5/2  Pertinent History Reviewed:  Medical & Surgical Hx:   Past medical, surgical, family, and social history reviewed in electronic medical record Medications: Reviewed & Updated - see associated section Allergies: Reviewed in electronic medical record  Objective Findings:  Vitals: BP 130/86 (BP Location: Right Arm, Patient Position: Sitting, Cuff Size: Normal)   Pulse 95   Wt 170 lb (77.1 kg)   LMP 09/28/2016 (Exact Date)   BMI 35.53 kg/m  Body mass index is 35.53 kg/m.  Physical Examination: General appearance - alert, well appearing, and in no distress Pelvic - cx clear w/o lesions, scant amt nonodorous d/c, -CMT, mild tenderness to palpation of uterus/lower pelvic region and bilateral adnexa, no masses noted  Results for orders placed or performed in visit on 10/24/16 (from the past 24 hour(s))  POCT Wet Prep Mellody Drown(Wet [place])   Collection Time: 10/24/16  3:35 PM  Result Value Ref Range   Source Wet Prep POC vaginal    WBC, Wet Prep HPF POC few    Bacteria Wet Prep HPF POC None (A) Few   BACTERIA WET PREP MORPHOLOGY POC     Clue Cells Wet Prep HPF POC Few (A) None   Clue Cells Wet Prep Whiff POC Negative Whiff    Yeast Wet Prep HPF POC None    KOH Wet Prep POC     Trichomonas Wet  Prep HPF POC Absent Absent     Assessment & Plan:  A:   Pelvic pain  Few clues on wet prep, will tx for BV d/t sx  P:  GC/CT today  Rx metronidazole 500mg  BID x 7d for BV, no sex or etoh while taking   Discussed pelvic u/s vs. Getting back on CHC to see if it helps pain, will discuss more when gc/ct results return and at next visit  Return in about 2 weeks (around 11/07/2016) for Pap & physical.  Marge DuncansBooker, Dorsey Charette Randall CNM, [facility] 10/24/2016 3:36 PM

## 2016-10-25 LAB — GC/CHLAMYDIA PROBE AMP
CHLAMYDIA, DNA PROBE: NEGATIVE
NEISSERIA GONORRHOEAE BY PCR: NEGATIVE

## 2016-10-30 DIAGNOSIS — L559 Sunburn, unspecified: Secondary | ICD-10-CM | POA: Diagnosis present

## 2016-10-30 DIAGNOSIS — L550 Sunburn of first degree: Secondary | ICD-10-CM | POA: Insufficient documentation

## 2016-10-31 ENCOUNTER — Emergency Department (HOSPITAL_COMMUNITY)
Admission: EM | Admit: 2016-10-31 | Discharge: 2016-10-31 | Disposition: A | Discharge status: Home / Self Care | Payer: 59 | Attending: Emergency Medicine | Admitting: Emergency Medicine

## 2016-10-31 ENCOUNTER — Encounter (HOSPITAL_COMMUNITY): Payer: Self-pay | Admitting: Emergency Medicine

## 2016-10-31 DIAGNOSIS — L550 Sunburn of first degree: Secondary | ICD-10-CM

## 2016-10-31 MED ORDER — SILVER SULFADIAZINE 1 % EX CREA
TOPICAL_CREAM | Freq: Once | CUTANEOUS | Status: AC
  Administered 2016-10-31: 1 via TOPICAL
  Filled 2016-10-31: qty 50

## 2016-10-31 MED ORDER — IBUPROFEN 800 MG PO TABS
800.0000 mg | ORAL_TABLET | Freq: Once | ORAL | Status: AC
  Administered 2016-10-31: 800 mg via ORAL
  Filled 2016-10-31: qty 1

## 2016-10-31 MED ORDER — IBUPROFEN 600 MG PO TABS: 600.0000 mg | ORAL_TABLET | Freq: Four times a day (QID) | ORAL | 0 refills | Status: DC | PRN

## 2016-10-31 NOTE — ED Provider Notes (Signed)
[facility]EM[facility]Y DEPT Provider Note   CSN: [id number] Arrival date & time: 10/30/16  2344     History   Chief Complaint Chief Complaint  Patient presents with  . Sunburn    HPI Martha Herrera is a 21 y.o. female.  HPI  Martha Herrera is a 21 y.o. female who presents to the Emergency Department complaining of sunburn to her upper back that occurred two days ago.  She states that she applied a suntan lotion with SPF of 30, but only applied it once all day.  Complains of stinging and burning and mild itching of her upper back.  She has tried aloe vera, benadryl and tylenol without relief.  She denies fever, chills, body aches or blisters.    Past Medical History:  Diagnosis Date  . Gastroesophageal reflux   . Headache(784.0)    not as often  . Pneumonia 2013  . Varicella     Patient Active Problem List   Diagnosis Date Noted  . Female pelvic pain 10/24/2016  . Tension headache 01/13/2013  . Migraine without aura 01/13/2013  . 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 Right sided abdominal pain 03/12/2012  . Substernal chest pain 03/12/2012  . Gastroesophageal reflux     Past Surgical History:  Procedure Laterality Date  . ESOPHAGOGASTRODUODENOSCOPY  05/03/2012   Procedure: ESOPHAGOGASTRODUODENOSCOPY (EGD);  Surgeon: Jon Gills, MD;  Location: [facility];  Service: Gastroenterology;  Laterality: N/A;  . tubes in ears    . WISDOM TOOTH EXTRACTION      OB History    Gravida Para Term Preterm AB Living   0 0 0 0 0 0   SAB TAB Ectopic Multiple Live Births   0 0 0 0 0       Home Medications    Prior to Admission medications   Medication Sig Start Date End Date Taking? Authorizing Provider  acetaminophen (TYLENOL) 500 MG tablet Take 1,000 mg by mouth every 6 (six) hours as needed.   Yes [provider]  metroNIDAZOLE (FLAGYL) 500 MG tablet Take 1 tablet (500 mg total) by mouth 2 (two) times daily. X 7 days. No sex or alcohol while taking 10/24/16  Yes Cheral Marker, CNM  Cholecalciferol 5000 UNITS capsule Take 5,000 Units by mouth daily.    [provider]  ibuprofen (ADVIL,MOTRIN) 600 MG tablet Take 1 tablet (600 mg total) by mouth every 6 (six) hours as needed. Take with food 10/31/16   Burnis Halling, PA-C  norelgestromin-ethinyl estradiol (ORTHO EVRA) 150-35 MCG/24HR transdermal patch Place 1 patch onto the skin once a week. Then leave off for 1 week to have period Patient not taking: Reported on 10/24/2016 09/22/14   Cheral Marker, CNM    Family History Family History  Problem Relation Age of Onset  . GER disease Father   . Asthma Brother   . Miscarriages / [place] Brother   . GER disease Maternal Grandmother   . Hyperlipidemia Maternal Grandmother   . ADD / ADHD Brother   . Miscarriages / [place] Mother   . Headache Mother   . Cancer Maternal Grandfather   . Diabetes Maternal Grandfather   . Hyperlipidemia Maternal Grandfather   . Vision loss Maternal Grandfather   . GER disease Maternal Aunt   . Migraines Maternal Aunt   . Anxiety disorder Maternal Aunt   . Diabetes Paternal Aunt   . Ataxia Neg Hx   . Chorea Neg Hx   . Dementia Neg Hx   .  Mental retardation Neg Hx   . Multiple sclerosis Neg Hx   . Neurofibromatosis Neg Hx   . Neuropathy Neg Hx   . Parkinsonism Neg Hx   . Seizures Neg Hx   . Stroke Neg Hx   . Depression Neg Hx   . Bipolar disorder Neg Hx     Social History Social History  Substance Use Topics  . Smoking status: Never Smoker  . Smokeless tobacco: Never Used  . Alcohol use No     Allergies   Penicillins and Amoxil [amoxicillin]   Review of Systems Review of Systems  Constitutional: Negative for activity change, appetite change, chills and fever.  HENT: Negative for facial swelling.   Respiratory: Negative for chest tightness and shortness of breath.   Cardiovascular: Negative for chest pain.  Musculoskeletal: Negative for myalgias, neck pain and neck stiffness.  Skin: Negative  for rash and wound.       Sunburn upper back  Neurological: Negative for dizziness, weakness, numbness and headaches.  All other systems reviewed and are negative.    Physical Exam Updated Vital Signs BP (!) 124/99   Pulse 69   Temp 97.5 F (36.4 C) (Oral)   Resp 18   Ht 4\' 10"  (1.473 m)   Wt 77.1 kg (170 lb)   LMP 10/28/2016   SpO2 100%   BMI 35.53 kg/m   Physical Exam  Constitutional: She is oriented to person, place, and time. She appears well-developed and well-nourished. No distress.  HENT:  Head: Normocephalic and atraumatic.  Mouth/Throat: Oropharynx is clear and moist.  Neck: Normal range of motion. Neck supple.  Cardiovascular: Normal rate, regular rhythm and intact distal pulses.   No murmur heard. Pulmonary/Chest: Effort normal and breath sounds normal. No respiratory distress.  Musculoskeletal: Normal range of motion. She exhibits no edema or tenderness.  Lymphadenopathy:    She has no cervical adenopathy.  Neurological: She is alert and oriented to person, place, and time. She exhibits normal muscle tone. Coordination normal.  Skin: Skin is warm. Capillary refill takes less than 2 seconds. No rash noted. No erythema.  First degree burn of upper back.  No edema or blisters.    Nursing note and vitals reviewed.    ED Treatments / Results  Labs (all labs ordered are listed, but only abnormal results are displayed) Labs Reviewed - No data to display  EKG  EKG Interpretation None       Radiology No results found.  Procedures Procedures (including critical care time)  Medications Ordered in ED Medications  ibuprofen (ADVIL,MOTRIN) tablet 800 mg (not administered)  silver sulfADIAZINE (SILVADENE) 1 % cream (not administered)     Initial Impression / Assessment and Plan / ED Course  I have reviewed the triage vital signs and the nursing notes.  Pertinent labs & imaging results that were available during my care of the patient were reviewed by  me and considered in my medical decision making (see chart for details).     Pt well appearing.  Mild sunburn w/o blisters.  Dispensed silvadene cream, rx for ibuprofen.    Final Clinical Impressions(s) / ED Diagnoses   Final diagnoses:  Sunburn of first degree    New Prescriptions New Prescriptions   IBUPROFEN (ADVIL,MOTRIN) 600 MG TABLET    Take 1 tablet (600 mg total) by mouth every 6 (six) hours as needed. Take with food     Pauline Ausriplett, Donetta Isaza, PA-C 10/31/16 0119    Dione BoozeGlick, David, MD 10/31/16 [phone]

## 2016-10-31 NOTE — Discharge Instructions (Signed)
Wash off and re-apply the burn cream twice a day for 4-5 days.  Cool compresses may also help with discomfort.  Apply a moisturizer daily as the burn begins to heal.

## 2016-10-31 NOTE — ED Triage Notes (Signed)
Pt c/o sunburn to upper back from sun on Saturday.

## 2016-11-08 ENCOUNTER — Encounter: Payer: Self-pay | Admitting: Women's Health

## 2016-11-08 ENCOUNTER — Other Ambulatory Visit (HOSPITAL_COMMUNITY)
Admission: RE | Admit: 2016-11-08 | Discharge: 2016-11-08 | Disposition: A | Discharge status: Home / Self Care | Payer: 59 | Source: Ambulatory Visit | Attending: Obstetrics & Gynecology | Admitting: Obstetrics & Gynecology

## 2016-11-08 ENCOUNTER — Ambulatory Visit (INDEPENDENT_AMBULATORY_CARE_PROVIDER_SITE_OTHER): Payer: 59 | Admitting: Women's Health

## 2016-11-08 VITALS — BP 110/78 | HR 84 | Ht 59.25 in | Wt 169.0 lb

## 2016-11-08 DIAGNOSIS — Z01419 Encounter for gynecological examination (general) (routine) without abnormal findings: Secondary | ICD-10-CM | POA: Insufficient documentation

## 2016-11-08 DIAGNOSIS — Z113 Encounter for screening for infections with a predominantly sexual mode of transmission: Secondary | ICD-10-CM

## 2016-11-08 DIAGNOSIS — Z3009 Encounter for other general counseling and advice on contraception: Secondary | ICD-10-CM

## 2016-11-08 NOTE — Progress Notes (Signed)
Subjective:   Martha Herrera is a 21 y.o. G0P0000 Caucasian female here for a routine well-woman exam.  Patient's last menstrual period was 10/28/2016.    Current complaints: was seen 6/26 for report of intermittent pelvic pain x ~28yr. Has been off of ortho evra patches for about 24yr or so. Periods have been regular ever since, only bleeds 2-3d, no cramping. Denies abnormal d/c, itching/odor/irritation. Is sexually active, uses condoms for contraception.  Pain is lower pelvic, sharp/shooting/stabbing- will last for a few days then go away.  GC/CT neg at that visit. Discussed pelvic u/s vs. going back on CHC to see if it helps pain- pt does not want to do either at this time Reports bug bite Rt anterior forearm yesterday while outside- did not see bug, just felt sting. Took benadryl and ibuprofen w/o much relief. Hurts and itches.  PCP: Marin Olp, [facility]         Social History: Sexual: heterosexual Marital Status: dating Living situation: w/ boyfriend and his family Occupation: Education officer, environmental at [employer] Tobacco/alcohol: none Illicit drugs: no history of illicit drug use  The following portions of the patient's history were reviewed and updated as appropriate: allergies, current medications, past family history, past medical history, past social history, past surgical history and problem list.  Past Medical History Past Medical History:  Diagnosis Date  . Gastroesophageal reflux   . Headache(784.0)    not as often  . Pneumonia 2013  . Varicella     Past Surgical History Past Surgical History:  Procedure Laterality Date  . ESOPHAGOGASTRODUODENOSCOPY  05/03/2012   Procedure: ESOPHAGOGASTRODUODENOSCOPY (EGD);  Surgeon: Jon Gills, MD;  Location: [facility] OR;  Service: Gastroenterology;  Laterality: N/A;  . tubes in ears    . WISDOM TOOTH EXTRACTION      Gynecologic History G0P0000  Patient's last menstrual period was 10/28/2016. Contraception: condoms Last Pap:  never. Results were: n/a Last mammogram: never. Results were: n/a Last TCS: never  Obstetric History OB History  Gravida Para Term Preterm AB Living  0 0 0 0 0 0  SAB TAB Ectopic Multiple Live Births  0 0 0 0 0        Current Medications Current Outpatient Prescriptions on File Prior to Visit  Medication Sig Dispense Refill  . acetaminophen (TYLENOL) 500 MG tablet Take 1,000 mg by mouth every 6 (six) hours as needed.     No current facility-administered medications on file prior to visit.     Review of Systems Patient denies any headaches, blurred vision, shortness of breath, chest pain, abdominal pain, problems with bowel movements, urination, or intercourse.  Objective:  BP 110/78 (BP Location: Left Arm, Patient Position: Sitting, Cuff Size: Normal)   Pulse 84   Ht 4' 11.25" (1.505 m)   Wt 169 lb (76.7 kg)   LMP 10/28/2016   BMI 33.85 kg/m  Physical Exam  General:  Well developed, well nourished, no acute distress. She is alert and oriented x3. Skin:  Warm and dry; Bug bite Rt anterior forearm, co-exam w/ JVF: small ~26mm raised blister in center, then large erythematous/edematous area around bite that encompasses almost entire forearm. Not hot to touch. Slightly tender. Borders marked w/ pen. Per JVF, pt to let us know if extends past borders-would give antibiotics at that time Neck:  Midline trachea, no thyromegaly or nodules Cardiovascular: Regular rate and rhythm, no murmur heard Lungs:  Effort normal, all lung fields clear to auscultation bilaterally Breasts:  No dominant palpable  mass, retraction, or nipple discharge Abdomen:  Soft, non tender, no hepatosplenomegaly or masses Pelvic:  External genitalia is normal in appearance.  The vagina is normal in appearance. The cervix is bulbous, no CMT.  Thin prep pap is done w/ reflex HR HPV cotesting. Uterus is felt to be normal size, shape, and contour.  No adnexal masses or tenderness noted. Extremities:  No swelling or  varicosities noted Psych:  She has a normal mood and affect  Assessment:   [facility] Chronic intermittent pelvic pain Bug bite Rt forearm STD screen  Plan:  GC/CT were neg 6/26 so will not repeat today; HIV, RPR today Pt to let us know if redness from bug bite extends past marked borders- will rx antibiotics if so; for now continue benadryl, can add zantac Let us know if decides for pelvic u/s or CHC for pelvic pain F/U 7837yr for physical, or sooner if needed Mammogram @21yo  or sooner if problems Colonoscopy @21yo  or sooner if problems  Marge DuncansBooker, Alexandrya Chim Randall CNM, WHNP-BC 11/08/2016 9:01 AM

## 2016-11-08 NOTE — Addendum Note (Signed)
Addended by: Colen DarlingYOUNG, JANET S on: 11/08/2016 09:53 AM   Modules accepted: Orders

## 2016-11-08 NOTE — Patient Instructions (Signed)
Benadryl, Zantac for bug bite, let us know if worsening

## 2016-11-09 ENCOUNTER — Telehealth: Payer: Self-pay | Admitting: *Deleted

## 2016-11-09 LAB — RPR: RPR Ser Ql: NONREACTIVE

## 2016-11-09 LAB — CYTOLOGY - PAP: Diagnosis: NEGATIVE

## 2016-11-09 LAB — HIV ANTIBODY (ROUTINE TESTING W REFLEX): HIV Screen 4th Generation wRfx: NONREACTIVE

## 2016-11-09 MED ORDER — DOXYCYCLINE HYCLATE 100 MG PO CAPS: 100.0000 mg | ORAL_CAPSULE | Freq: Two times a day (BID) | ORAL | 0 refills | Status: DC

## 2016-11-09 NOTE — Telephone Encounter (Signed)
Pt called stating redness extended past marked borders. Discussed w/ [facility], rx doxycycline 100mg  BID x 14d, have come in next week for re-eval. Pt can't come Monday, so is scheduled for Tues. Both Dr. Emelda FearFerguson and I will not be here, we are the only 2 who saw the bite yesterday. Pt to take picture of bite/area today and bring w/ her to appt. If worsens at all, to go to ED.  Martha Herrera, CNM, [facility]NP-BC 11/09/2016 3:26 PM

## 2016-11-09 NOTE — Telephone Encounter (Signed)
Patient called stating the bite mark has extended the marked borders (up to her mid forearm now) despite Benadryl and cream. Stated you would send antibiotic. Please advise.

## 2016-11-09 NOTE — Telephone Encounter (Signed)
INformed patient antibiotic was sent to pharmacy but she needed to follow-up next Monday for re-evaluation. However, if it worsened over the weekend, then she needed to go to the ER. Verbalized understanding. Stated she could not come Monday but could on Tuesday. Will schedule.

## 2016-11-10 ENCOUNTER — Telehealth: Payer: Self-pay | Admitting: *Deleted

## 2016-11-10 NOTE — Telephone Encounter (Signed)
LMOVM to take a picture of the bug bite so Drenda FreezeFran would have something to compare to since she did not initially see her.

## 2016-11-14 ENCOUNTER — Ambulatory Visit: Payer: 59 | Admitting: Advanced Practice Midwife

## 2017-11-28 ENCOUNTER — Other Ambulatory Visit: Payer: Self-pay | Admitting: *Deleted

## 2017-11-28 ENCOUNTER — Telehealth: Payer: Self-pay | Admitting: *Deleted

## 2017-11-28 DIAGNOSIS — Z3491 Encounter for supervision of normal pregnancy, unspecified, first trimester: Secondary | ICD-10-CM

## 2017-11-28 DIAGNOSIS — O3680X0 Pregnancy with inconclusive fetal viability, not applicable or unspecified: Secondary | ICD-10-CM

## 2017-11-28 NOTE — Telephone Encounter (Signed)
[facility] called and is referring a new ob to our office . The preg test was confirmed there and she is 3 weeks 6 days.  Pt aware that we will give pt a call in [facility] # [phone], pt has fp mcd but is in the process of getting that changed to preg mcd.  PT needs any am appt in Aug and in Sept only Friday AM appointments.

## 2018-01-02 ENCOUNTER — Ambulatory Visit (HOSPITAL_COMMUNITY): Payer: 59

## 2018-01-04 ENCOUNTER — Other Ambulatory Visit: Payer: Self-pay | Admitting: Adult Health

## 2018-01-04 ENCOUNTER — Ambulatory Visit (HOSPITAL_COMMUNITY)
Admission: RE | Admit: 2018-01-04 | Discharge: 2018-01-04 | Disposition: A | Discharge status: Home / Self Care | Payer: Medicaid Other | Source: Ambulatory Visit | Attending: Adult Health | Admitting: Adult Health

## 2018-01-04 DIAGNOSIS — O3680X0 Pregnancy with inconclusive fetal viability, not applicable or unspecified: Secondary | ICD-10-CM

## 2018-01-04 DIAGNOSIS — Z3687 Encounter for antenatal screening for uncertain dates: Secondary | ICD-10-CM | POA: Diagnosis not present

## 2018-01-04 DIAGNOSIS — Z3A09 9 weeks gestation of pregnancy: Secondary | ICD-10-CM | POA: Diagnosis not present

## 2018-01-11 ENCOUNTER — Telehealth: Payer: Self-pay | Admitting: Obstetrics & Gynecology

## 2018-01-11 MED ORDER — DOXYLAMINE-PYRIDOXINE ER 20-20 MG PO TBCR: 1.0000 | EXTENDED_RELEASE_TABLET | Freq: Every day | ORAL | 8 refills | Status: DC

## 2018-01-14 ENCOUNTER — Telehealth: Payer: Self-pay | Admitting: Obstetrics & Gynecology

## 2018-01-14 MED ORDER — DOXYLAMINE-PYRIDOXINE 10-10 MG PO TBEC: DELAYED_RELEASE_TABLET | ORAL | 6 refills | Status: DC

## 2018-01-14 NOTE — Telephone Encounter (Signed)
Patient says she has pregnancy medicaid so [facility]

## 2018-01-14 NOTE — Telephone Encounter (Signed)
Patient called stating that she called on Friday to see if she could get nausea medication, I seen that we did but it was sent to [facility], pt needs one sent to the [facility] pharmacy in [place]. Please contact pt when placed

## 2018-01-18 ENCOUNTER — Encounter: Payer: Self-pay | Admitting: Women's Health

## 2018-01-18 ENCOUNTER — Ambulatory Visit (INDEPENDENT_AMBULATORY_CARE_PROVIDER_SITE_OTHER): Payer: Medicaid Other | Admitting: Women's Health

## 2018-01-18 ENCOUNTER — Ambulatory Visit: Payer: Medicaid Other | Admitting: *Deleted

## 2018-01-18 VITALS — BP 125/80 | HR 85 | Wt 178.0 lb

## 2018-01-18 DIAGNOSIS — Z23 Encounter for immunization: Secondary | ICD-10-CM | POA: Diagnosis not present

## 2018-01-18 DIAGNOSIS — Z3682 Encounter for antenatal screening for nuchal translucency: Secondary | ICD-10-CM

## 2018-01-18 DIAGNOSIS — Z3401 Encounter for supervision of normal first pregnancy, first trimester: Secondary | ICD-10-CM | POA: Diagnosis not present

## 2018-01-18 DIAGNOSIS — Z3A11 11 weeks gestation of pregnancy: Secondary | ICD-10-CM | POA: Diagnosis not present

## 2018-01-18 DIAGNOSIS — O0990 Supervision of high risk pregnancy, unspecified, unspecified trimester: Secondary | ICD-10-CM | POA: Insufficient documentation

## 2018-01-18 DIAGNOSIS — Z331 Pregnant state, incidental: Secondary | ICD-10-CM

## 2018-01-18 DIAGNOSIS — Z1389 Encounter for screening for other disorder: Secondary | ICD-10-CM

## 2018-01-18 LAB — POCT URINALYSIS DIPSTICK OB
Blood, UA: NEGATIVE
GLUCOSE, UA: NEGATIVE
Ketones, UA: NEGATIVE
LEUKOCYTES UA: NEGATIVE
Nitrite, UA: NEGATIVE
POC,PROTEIN,UA: NEGATIVE

## 2018-01-18 NOTE — Patient Instructions (Signed)
Martha Herrera, I greatly value your feedback.  If you receive a survey following your visit with us today, we appreciate you taking the time to fill it out.  Thanks, Joellyn HaffKim Booker, CNM, WHNP-BC   Nausea & Vomiting  Have saltine crackers or pretzels by your bed and eat a few bites before you raise your head out of bed in the morning  Eat small frequent meals throughout the day instead of large meals  Drink plenty of fluids throughout the day to stay hydrated, just don't drink a lot of fluids with your meals.  This can make your stomach fill up faster making you feel sick  Do not brush your teeth right after you eat  Products with real ginger are good for nausea, like ginger ale and ginger hard candy Make sure it says made with real ginger!  Sucking on sour candy like lemon heads is also good for nausea  If your prenatal vitamins make you nauseated, take them at night so you will sleep through the nausea  Sea Bands  If you feel like you need medicine for the nausea & vomiting please let us know  If you are unable to keep any fluids or food down please let us know   Constipation  Drink plenty of fluid, preferably water, throughout the day  Eat foods high in fiber such as fruits, vegetables, and grains  Exercise, such as walking, is a good way to keep your bowels regular  Drink warm fluids, especially warm prune juice, or decaf coffee  Eat a 1/2 cup of real oatmeal (not instant), 1/2 cup applesauce, and 1/2-1 cup warm prune juice every day  If needed, you may take Colace (docusate sodium) stool softener once or twice a day to help keep the stool soft. If you are pregnant, wait until you are out of your first trimester (12-14 weeks of pregnancy)  If you still are having problems with constipation, you may take Miralax once daily as needed to help keep your bowels regular.  If you are pregnant, wait until you are out of your first trimester (12-14 weeks of pregnancy)   First  Trimester of Pregnancy The first trimester of pregnancy is from week 1 until the end of week 12 (months 1 through 3). A week after a sperm fertilizes an egg, the egg will implant on the wall of the uterus. This embryo will begin to develop into a baby. Genes from you and your partner are forming the baby. The female genes determine whether the baby is a boy or a girl. At 6-8 weeks, the eyes and face are formed, and the heartbeat can be seen on ultrasound. At the end of 12 weeks, all the baby's organs are formed.  Now that you are pregnant, you will want to do everything you can to have a healthy baby. Two of the most important things are to get good prenatal care and to follow your health care provider's instructions. Prenatal care is all the medical care you receive before the baby's birth. This care will help prevent, find, and treat any problems during the pregnancy and childbirth. BODY CHANGES Your body goes through many changes during pregnancy. The changes vary from woman to woman.   You may gain or lose a couple of pounds at first.  You may feel sick to your stomach (nauseous) and throw up (vomit). If the vomiting is uncontrollable, call your health care provider.  You may tire easily.  You may develop headaches  that can be relieved by medicines approved by your health care provider.  You may urinate more often. Painful urination may mean you have a bladder infection.  You may develop heartburn as a result of your pregnancy.  You may develop constipation because certain hormones are causing the muscles that push waste through your intestines to slow down.  You may develop hemorrhoids or swollen, bulging veins (varicose veins).  Your breasts may begin to grow larger and become tender. Your nipples may stick out more, and the tissue that surrounds them (areola) may become darker.  Your gums may bleed and may be sensitive to brushing and flossing.  Dark spots or blotches (chloasma, mask  of pregnancy) may develop on your face. This will likely fade after the baby is born.  Your menstrual periods will stop.  You may have a loss of appetite.  You may develop cravings for certain kinds of food.  You may have changes in your emotions from day to day, such as being excited to be pregnant or being concerned that something may go wrong with the pregnancy and baby.  You may have more vivid and strange dreams.  You may have changes in your hair. These can include thickening of your hair, rapid growth, and changes in texture. Some women also have hair loss during or after pregnancy, or hair that feels dry or thin. Your hair will most likely return to normal after your baby is born. WHAT TO EXPECT AT YOUR PRENATAL VISITS During a routine prenatal visit:  You will be weighed to make sure you and the baby are growing normally.  Your blood pressure will be taken.  Your abdomen will be measured to track your baby's growth.  The fetal heartbeat will be listened to starting around week 10 or 12 of your pregnancy.  Test results from any previous visits will be discussed. Your health care provider may ask you:  How you are feeling.  If you are feeling the baby move.  If you have had any abnormal symptoms, such as leaking fluid, bleeding, severe headaches, or abdominal cramping.  If you have any questions. Other tests that may be performed during your first trimester include:  Blood tests to find your blood type and to check for the presence of any previous infections. They will also be used to check for low iron levels (anemia) and Rh antibodies. Later in the pregnancy, blood tests for diabetes will be done along with other tests if problems develop.  Urine tests to check for infections, diabetes, or protein in the urine.  An ultrasound to confirm the proper growth and development of the baby.  An amniocentesis to check for possible genetic problems.  Fetal screens for spina  bifida and Down syndrome.  You may need other tests to make sure you and the baby are doing well. HOME CARE INSTRUCTIONS  Medicines  Follow your health care provider's instructions regarding medicine use. Specific medicines may be either safe or unsafe to take during pregnancy.  Take your prenatal vitamins as directed.  If you develop constipation, try taking a stool softener if your health care provider approves. Diet  Eat regular, well-balanced meals. Choose a variety of foods, such as meat or vegetable-based protein, fish, milk and low-fat dairy products, vegetables, fruits, and whole grain breads and cereals. Your health care provider will help you determine the amount of weight gain that is right for you.  Avoid raw meat and uncooked cheese. These carry germs that can  cause birth defects in the baby.  Eating four or five small meals rather than three large meals a day may help relieve nausea and vomiting. If you start to feel nauseous, eating a few soda crackers can be helpful. Drinking liquids between meals instead of during meals also seems to help nausea and vomiting.  If you develop constipation, eat more high-fiber foods, such as fresh vegetables or fruit and whole grains. Drink enough fluids to keep your urine clear or pale yellow. Activity and Exercise  Exercise only as directed by your health care provider. Exercising will help you:  Control your weight.  Stay in shape.  Be prepared for labor and delivery.  Experiencing pain or cramping in the lower abdomen or low back is a good sign that you should stop exercising. Check with your health care provider before continuing normal exercises.  Try to avoid standing for long periods of time. Move your legs often if you must stand in one place for a long time.  Avoid heavy lifting.  Wear low-heeled shoes, and practice good posture.  You may continue to have sex unless your health care provider directs you  otherwise. Relief of Pain or Discomfort  Wear a good support bra for breast tenderness.   Take warm sitz baths to soothe any pain or discomfort caused by hemorrhoids. Use hemorrhoid cream if your health care provider approves.   Rest with your legs elevated if you have leg cramps or low back pain.  If you develop varicose veins in your legs, wear support hose. Elevate your feet for 15 minutes, 3-4 times a day. Limit salt in your diet. Prenatal Care  Schedule your prenatal visits by the twelfth week of pregnancy. They are usually scheduled monthly at first, then more often in the last 2 months before delivery.  Write down your questions. Take them to your prenatal visits.  Keep all your prenatal visits as directed by your health care provider. Safety  Wear your seat belt at all times when driving.  Make a list of emergency phone numbers, including numbers for family, friends, the hospital, and police and fire departments. General Tips  Ask your health care provider for a referral to a local prenatal education class. Begin classes no later than at the beginning of month 6 of your pregnancy.  Ask for help if you have counseling or nutritional needs during pregnancy. Your health care provider can offer advice or refer you to specialists for help with various needs.  Do not use hot tubs, steam rooms, or saunas.  Do not douche or use tampons or scented sanitary pads.  Do not cross your legs for long periods of time.  Avoid cat litter boxes and soil used by cats. These carry germs that can cause birth defects in the baby and possibly loss of the fetus by miscarriage or stillbirth.  Avoid all smoking, herbs, alcohol, and medicines not prescribed by your health care provider. Chemicals in these affect the formation and growth of the baby.  Schedule a dentist appointment. At home, brush your teeth with a soft toothbrush and be gentle when you floss. SEEK MEDICAL CARE IF:   You have  dizziness.  You have mild pelvic cramps, pelvic pressure, or nagging pain in the abdominal area.  You have persistent nausea, vomiting, or diarrhea.  You have a bad smelling vaginal discharge.  You have pain with urination.  You notice increased swelling in your face, hands, legs, or ankles. SEEK IMMEDIATE MEDICAL CARE IF:  You have a fever.  You are leaking fluid from your vagina.  You have spotting or bleeding from your vagina.  You have severe abdominal cramping or pain.  You have rapid weight gain or loss.  You vomit blood or material that looks like coffee grounds.  You are exposed to [place] measles and have never had them.  You are exposed to fifth disease or chickenpox.  You develop a severe headache.  You have shortness of breath.  You have any kind of trauma, such as from a fall or a car accident. Document Released: 04/11/2001 Document Revised: 09/01/2013 Document Reviewed: 02/25/2013 [facility] Patient Information 2015 [place], [place]. This information is not intended to replace advice given to you by your health care provider. Make sure you discuss any questions you have with your health care provider.

## 2018-01-18 NOTE — Progress Notes (Signed)
INITIAL OBSTETRICAL VISIT Patient name: JERMEKA SCHLOTTERBECK MRN [id number]  Date of birth: Feb 12, 1996 Chief Complaint:   Initial Prenatal Visit  History of Present Illness:   IZUMI MIXON is a 22 y.o. G91P0000 Caucasian female at [redacted]w[redacted]d by 9wk u/s, with an Estimated Date of Delivery: 08/08/18 being seen today for her initial obstetrical visit.   Her obstetrical history is significant for primigravida.   Today she reports no complaints.  No LMP recorded. Patient is pregnant. Last pap 11/08/16. Results were: normal Review of Systems:   Pertinent items are noted in HPI Denies cramping/contractions, leakage of fluid, vaginal bleeding, abnormal vaginal discharge w/ itching/odor/irritation, headaches, visual changes, shortness of breath, chest pain, abdominal pain, severe nausea/vomiting, or problems with urination or bowel movements unless otherwise stated above.  Pertinent History Reviewed:  Reviewed past medical,surgical, social, obstetrical and family history.  Reviewed problem list, medications and allergies. OB History  Gravida Para Term Preterm AB Living  1 0 0 0 0 0  SAB TAB Ectopic Multiple Live Births  0 0 0 0 0    # Outcome Date GA Lbr Len/2nd Weight Sex Delivery Anes PTL Lv  1 Current            Physical Assessment:   Vitals:   01/18/18 0911  BP: 125/80  Pulse: 85  Weight: 178 lb (80.7 kg)  Body mass index is 35.65 kg/m.       Physical Examination:  General appearance - well appearing, and in no distress  Mental status - alert, oriented to person, place, and time  Psych:  She has a normal mood and affect  Skin - warm and dry, normal color, no suspicious lesions noted  Chest - effort normal, all lung fields clear to auscultation bilaterally  Heart - normal rate and regular rhythm  Abdomen - soft, nontender  Extremities:  No swelling or varicosities noted  Thin prep pap is not done  Fetal Heart Rate (bpm): +u/s via informal transabdominal u/s, +active fetus  Results for  orders placed or performed in visit on 01/18/18 (from the past 24 hour(s))  POC Urinalysis Dipstick OB   Collection Time: 01/18/18  9:40 AM  Result Value Ref Range   Color, UA     Clarity, UA     Glucose, UA Negative Negative   Bilirubin, UA     Ketones, UA neg    Spec Grav, UA     Blood, UA neg    pH, UA     POC Protein UA Negative Negative, Trace   Urobilinogen, UA     Nitrite, UA neg    Leukocytes, UA Negative Negative   Appearance     Odor      Assessment & Plan:  1) Low-Risk Pregnancy G1P0000 at [redacted]w[redacted]d with an Estimated Date of Delivery: 08/08/18   2) Initial OB visit  Meds: No orders of the defined types were placed in this encounter.  Initial labs obtained Continue prenatal vitamins Reviewed n/v relief measures and warning s/s to report Reviewed recommended weight gain based on pre-gravid BMI Encouraged well-balanced diet Genetic Screening discussed Integrated Screen: requested Cystic fibrosis screening discussed declined Ultrasound discussed; fetal survey: requested CCNC completed>PCM not here, faxed Flu shot today  Follow-up: Return in about 2 weeks (around 02/01/2018) for LROB, US:NT+1stIT.   Orders Placed This Encounter  Procedures  . GC/Chlamydia Probe Amp  . Urine Culture  . US Fetal Nuchal Translucency Measurement  . Flu Vaccine QUAD 36+ mos IM  .  Urinalysis, Routine w reflex microscopic  . Obstetric Panel, Including HIV  . Pain Management Screening Profile (10S)  . POC Urinalysis Dipstick OB    Cheral MarkerKimberly R Amiyah Shryock CNM, [facility]NP[facility]BC 01/18/2018 9:56 AM

## 2018-01-19 LAB — URINALYSIS, ROUTINE W REFLEX MICROSCOPIC
BILIRUBIN UA: NEGATIVE
GLUCOSE, UA: NEGATIVE
Ketones, UA: NEGATIVE
Leukocytes, UA: NEGATIVE
NITRITE UA: NEGATIVE
PH UA: 8 — AB (ref 5.0–7.5)
PROTEIN UA: NEGATIVE
RBC UA: NEGATIVE
Specific Gravity, UA: 1.012 (ref 1.005–1.030)
UUROB: 0.2 mg/dL (ref 0.2–1.0)

## 2018-01-19 LAB — OBSTETRIC PANEL, INCLUDING HIV
ANTIBODY SCREEN: NEGATIVE
Basophils Absolute: 0 10*3/uL (ref 0.0–0.2)
Basos: 0 %
EOS (ABSOLUTE): 0.1 10*3/uL (ref 0.0–0.4)
Eos: 1 %
HEP B S AG: NEGATIVE
HIV SCREEN 4TH GENERATION: NONREACTIVE
Hematocrit: 40.8 % (ref 34.0–46.6)
Hemoglobin: 13.4 g/dL (ref 11.1–15.9)
IMMATURE GRANS (ABS): 0.1 10*3/uL (ref 0.0–0.1)
Immature Granulocytes: 1 %
Lymphocytes Absolute: 2.1 10*3/uL (ref 0.7–3.1)
Lymphs: 21 %
MCH: 27.3 pg (ref 26.6–33.0)
MCHC: 32.8 g/dL (ref 31.5–35.7)
MCV: 83 fL (ref 79–97)
MONOCYTES: 6 %
Monocytes Absolute: 0.6 10*3/uL (ref 0.1–0.9)
NEUTROS PCT: 71 %
Neutrophils Absolute: 7 10*3/uL (ref 1.4–7.0)
Platelets: 301 10*3/uL (ref 150–450)
RBC: 4.91 x10E6/uL (ref 3.77–5.28)
RDW: 12.5 % (ref 12.3–15.4)
RPR Ser Ql: NONREACTIVE
Rh Factor: POSITIVE
WBC: 9.8 10*3/uL (ref 3.4–10.8)

## 2018-01-21 ENCOUNTER — Encounter: Payer: Self-pay | Admitting: Women's Health

## 2018-01-21 DIAGNOSIS — O09899 Supervision of other high risk pregnancies, unspecified trimester: Secondary | ICD-10-CM | POA: Insufficient documentation

## 2018-01-21 DIAGNOSIS — O9989 Other specified diseases and conditions complicating pregnancy, childbirth and the puerperium: Secondary | ICD-10-CM

## 2018-01-21 DIAGNOSIS — Z283 Underimmunization status: Secondary | ICD-10-CM | POA: Insufficient documentation

## 2018-01-21 LAB — PMP SCREEN PROFILE (10S), URINE
AMPHETAMINE SCREEN URINE: NEGATIVE ng/mL
BARBITURATE SCREEN URINE: NEGATIVE ng/mL
BENZODIAZEPINE SCREEN, URINE: NEGATIVE ng/mL
CANNABINOIDS UR QL SCN: NEGATIVE ng/mL
COCAINE(METAB.)SCREEN, URINE: NEGATIVE ng/mL
Creatinine(Crt), U: 58.9 mg/dL (ref 20.0–300.0)
METHADONE SCREEN, URINE: NEGATIVE ng/mL
OXYCODONE+OXYMORPHONE UR QL SCN: NEGATIVE ng/mL
Opiate Scrn, Ur: NEGATIVE ng/mL
PH UR, DRUG SCRN: 7.7 (ref 4.5–8.9)
PHENCYCLIDINE QUANTITATIVE URINE: NEGATIVE ng/mL
Propoxyphene Scrn, Ur: NEGATIVE ng/mL

## 2018-01-21 LAB — URINE CULTURE

## 2018-01-23 LAB — GC/CHLAMYDIA PROBE AMP
Chlamydia trachomatis, NAA: NEGATIVE
Neisseria gonorrhoeae by PCR: NEGATIVE

## 2018-01-31 ENCOUNTER — Telehealth: Payer: Self-pay | Admitting: *Deleted

## 2018-01-31 NOTE — Telephone Encounter (Signed)
Patient states she has been having lower abdominal cramping since 6am.  No bleeding or abnormal discharge.  Encouraged patient to push extra fluids and rest.  Scheduled for visit tomorrow.  Verbalized understanding.

## 2018-02-01 ENCOUNTER — Encounter: Payer: Self-pay | Admitting: Obstetrics & Gynecology

## 2018-02-01 ENCOUNTER — Ambulatory Visit (INDEPENDENT_AMBULATORY_CARE_PROVIDER_SITE_OTHER): Payer: Medicaid Other

## 2018-02-01 ENCOUNTER — Ambulatory Visit (INDEPENDENT_AMBULATORY_CARE_PROVIDER_SITE_OTHER): Payer: Medicaid Other | Admitting: Obstetrics & Gynecology

## 2018-02-01 VITALS — BP 121/79 | HR 88 | Wt 177.0 lb

## 2018-02-01 DIAGNOSIS — Z1389 Encounter for screening for other disorder: Secondary | ICD-10-CM

## 2018-02-01 DIAGNOSIS — Z3401 Encounter for supervision of normal first pregnancy, first trimester: Secondary | ICD-10-CM

## 2018-02-01 DIAGNOSIS — Z3682 Encounter for antenatal screening for nuchal translucency: Secondary | ICD-10-CM | POA: Diagnosis not present

## 2018-02-01 DIAGNOSIS — Z331 Pregnant state, incidental: Secondary | ICD-10-CM

## 2018-02-01 DIAGNOSIS — Z3A13 13 weeks gestation of pregnancy: Secondary | ICD-10-CM

## 2018-02-01 LAB — POCT URINALYSIS DIPSTICK OB
Glucose, UA: NEGATIVE
Leukocytes, UA: NEGATIVE
NITRITE UA: NEGATIVE
PROTEIN: NEGATIVE
RBC UA: NEGATIVE

## 2018-02-01 NOTE — Progress Notes (Signed)
13+1 wks,measurements c/w dates,NB present,NT 1.4 mm,normal right ovary,left ovary not visualized,fhr 158 bpm,posterior pl gr 0,crl 71.21 mm

## 2018-02-01 NOTE — Progress Notes (Signed)
   LOW-RISK PREGNANCY VISIT Patient name: Martha Herrera MRN [id number]  Date of birth: 01-22-1996 Chief Complaint:   Routine Prenatal Visit (1st IT)  History of Present Illness:   Martha Herrera is a 22 y.o. G1P0000 female at [redacted]w[redacted]d with an Estimated Date of Delivery: 08/08/18 being seen today for ongoing management of a low-risk pregnancy.  Today she reports no complaints.  . Vag. Bleeding: None.   . denies leaking of fluid. Review of Systems:   Pertinent items are noted in HPI Denies abnormal vaginal discharge w/ itching/odor/irritation, headaches, visual changes, shortness of breath, chest pain, abdominal pain, severe nausea/vomiting, or problems with urination or bowel movements unless otherwise stated above. Pertinent History Reviewed:  Reviewed past medical,surgical, social, obstetrical and family history.  Reviewed problem list, medications and allergies. Physical Assessment:   Vitals:   02/01/18 1310  BP: 121/79  Pulse: 88  Weight: 177 lb (80.3 kg)  Body mass index is 35.45 kg/m.        Physical Examination:   General appearance: Well appearing, and in no distress  Mental status: Alert, oriented to person, place, and time  Skin: Warm & dry  Cardiovascular: Normal heart rate noted  Respiratory: Normal respiratory effort, no distress  Abdomen: Soft, gravid, nontender  Pelvic: Cervical exam deferred         Extremities: Edema: Trace  Fetal Status: Fetal Heart Rate (bpm): 158        Results for orders placed or performed in visit on 02/01/18 (from the past 24 hour(s))  POC Urinalysis Dipstick OB   Collection Time: 02/01/18  1:12 PM  Result Value Ref Range   Color, UA     Clarity, UA     Glucose, UA Negative Negative   Bilirubin, UA     Ketones, UA 1+    Spec Grav, UA     Blood, UA neg    pH, UA     POC Protein UA Negative Negative, Trace   Urobilinogen, UA     Nitrite, UA neg    Leukocytes, UA Negative Negative   Appearance     Odor      Assessment & Plan:  1)  Low-risk pregnancy G1P0000 at [redacted]w[redacted]d with an Estimated Date of Delivery: 08/08/18      Meds: No orders of the defined types were placed in this encounter.  Labs/procedures today: IT/NT today  Plan:  Continue routine obstetrical care   Reviewed: Preterm labor symptoms and general obstetric precautions including but not limited to vaginal bleeding, contractions, leaking of fluid and fetal movement were reviewed in detail with the patient.  All questions were answered  Follow-up: Return in about 4 weeks (around 03/01/2018) for LROB.  Orders Placed This Encounter  Procedures  . Integrated 1  . POC Urinalysis Dipstick OB   Amaryllis Dyke Jossalin Chervenak  02/01/2018 1:23 PM

## 2018-02-05 LAB — INTEGRATED 1
Crown Rump Length: 71.2 mm
Gest. Age on Collection Date: 13.1 weeks
MATERNAL AGE AT EDD: 22.9 a
NUCHAL TRANSLUCENCY (NT): 1.4 mm
NUMBER OF FETUSES: 1
PAPP-A VALUE: 1064.7 ng/mL
WEIGHT: 177 [lb_av]

## 2018-02-11 ENCOUNTER — Telehealth: Payer: Self-pay | Admitting: *Deleted

## 2018-02-11 NOTE — Telephone Encounter (Signed)
Patient states she woke up with severe itching and a few small red bumps on her body this morning at 2am.  She has not changed detergents, soaps, started any new medications or did anything out of the ordinary.  Advised patient to try hydrocortisone cream, Benadryl (can make drowsy) or Benadryl cream.  Also encouraged use of Aveeno products or oatmeal baths. Advised if not better in a couple of days, to let us know.  Verbalized understanding.

## 2018-02-12 ENCOUNTER — Telehealth: Payer: Self-pay | Admitting: *Deleted

## 2018-02-12 NOTE — Telephone Encounter (Signed)
Pt states that she is still experiencing itching despite taking benadryl and oatmeal bath. Informed pt that we would add her to schedule tomorrow to be evaluated by a provider. Pt verbalized understanding.

## 2018-02-13 ENCOUNTER — Encounter: Payer: Medicaid Other | Admitting: Advanced Practice Midwife

## 2018-03-01 ENCOUNTER — Ambulatory Visit (INDEPENDENT_AMBULATORY_CARE_PROVIDER_SITE_OTHER): Payer: Medicaid Other | Admitting: Adult Health

## 2018-03-01 ENCOUNTER — Encounter: Payer: Self-pay | Admitting: Adult Health

## 2018-03-01 VITALS — BP 130/82 | HR 100 | Wt 179.6 lb

## 2018-03-01 DIAGNOSIS — Z3A17 17 weeks gestation of pregnancy: Secondary | ICD-10-CM

## 2018-03-01 DIAGNOSIS — Z363 Encounter for antenatal screening for malformations: Secondary | ICD-10-CM

## 2018-03-01 DIAGNOSIS — O26892 Other specified pregnancy related conditions, second trimester: Secondary | ICD-10-CM

## 2018-03-01 DIAGNOSIS — Z1379 Encounter for other screening for genetic and chromosomal anomalies: Secondary | ICD-10-CM

## 2018-03-01 DIAGNOSIS — Z3402 Encounter for supervision of normal first pregnancy, second trimester: Secondary | ICD-10-CM

## 2018-03-01 DIAGNOSIS — Z1389 Encounter for screening for other disorder: Secondary | ICD-10-CM | POA: Insufficient documentation

## 2018-03-01 DIAGNOSIS — R12 Heartburn: Secondary | ICD-10-CM

## 2018-03-01 DIAGNOSIS — Z331 Pregnant state, incidental: Secondary | ICD-10-CM

## 2018-03-01 LAB — POCT URINALYSIS DIPSTICK OB
Blood, UA: NEGATIVE
GLUCOSE, UA: NEGATIVE
KETONES UA: NEGATIVE
Leukocytes, UA: NEGATIVE
Nitrite, UA: NEGATIVE
POC,PROTEIN,UA: NEGATIVE

## 2018-03-01 MED ORDER — OMEPRAZOLE 40 MG PO CPDR: 40.0000 mg | DELAYED_RELEASE_CAPSULE | Freq: Every day | ORAL | 6 refills | Status: DC

## 2018-03-01 NOTE — Addendum Note (Signed)
Addended by: Federico Flake A on: 03/01/2018 12:49 PM   Modules accepted: Orders

## 2018-03-01 NOTE — Progress Notes (Signed)
   LOW-RISK PREGNANCY VISIT Patient name: Martha Herrera MRN [id number]  Date of birth: Nov 14, 1995 Chief Complaint:   Routine Prenatal Visit (heart burns)  History of Present Illness:   Martha Herrera is a 22 y.o. G1P0000 female at [redacted]w[redacted]d with an Estimated Date of Delivery: 08/08/18 being seen today for ongoing management of a low-risk pregnancy.  Today she reports heart burn. Contractions: Not present.  .  Movement: Absent. {Actions; No leaking of fluid. Review of Systems:   Pertinent items are noted in HPI Denies abnormal vaginal discharge w/ itching/odor/irritation, headaches, visual changes, shortness of breath, chest pain, abdominal pain, severe nausea/vomiting, or problems with urination or bowel movements unless otherwise stated above. Pertinent History Reviewed:  Reviewed past medical,surgical, social, obstetrical and family history.  Reviewed problem list, medications and allergies. Physical Assessment:   Vitals:   03/01/18 1149  BP: 130/82  Pulse: 100  Weight: 179 lb 9.6 oz (81.5 kg)  Body mass index is 35.97 kg/m.        Physical Examination:   General appearance: Well appearing, and in no distress  Mental status: Alert, oriented to person, place, and time  Skin: Warm & dry  Cardiovascular: Normal heart rate noted  Respiratory: Normal respiratory effort, no distress  Abdomen: Soft, gravid, nontender  Pelvic: deferred        Extremities: Edema: Trace  Fetal Status:     Movement: Absent    Results for orders placed or performed in visit on 03/01/18 (from the past 24 hour(s))  POC Urinalysis Dipstick OB   Collection Time: 03/01/18 11:57 AM  Result Value Ref Range   Color, UA     Clarity, UA     Glucose, UA Negative Negative   Bilirubin, UA     Ketones, UA neg    Spec Grav, UA     Blood, UA neg    pH, UA     POC Protein UA Negative Negative, Trace   Urobilinogen, UA     Nitrite, UA neg    Leukocytes, UA Negative Negative   Appearance     Odor      Assessment &  Plan:  1) Low-risk pregnancy G1P0000 at [redacted]w[redacted]d with an Estimated Date of Delivery: 08/08/18   2) heartburn   Meds:  Meds ordered this encounter  Medications  . omeprazole (PRILOSEC) 40 MG capsule    Sig: Take 1 capsule (40 mg total) by mouth daily.    Dispense:  30 capsule    Refill:  6    Order Specific Question:   Supervising Provider    Answer:   Lazaro Arms [[id number]]   Labs/procedures today: Second IT today   Plan:  Continue routine obstetrical care   Reviewed:  labor symptoms and general obstetric precautions including but not limited to vaginal bleeding, contractions, leaking of fluid and fetal movement were reviewed in detail with the patient.  All questions were answered  Follow-up: Follow in 2 weeks for anatomy scan  Orders Placed This Encounter  Procedures  . POC Urinalysis Dipstick OB   Cyril Mourning ANP-BC 03/01/2018 12:38 PM

## 2018-03-01 NOTE — Patient Instructions (Signed)
Second Trimester of Pregnancy The second trimester is from week 13 through week 28, month 4 through 6. This is often the time in pregnancy that you feel your best. Often times, morning sickness has lessened or quit. You may have more energy, and you may get hungry more often. Your unborn baby (fetus) is growing rapidly. At the end of the sixth month, he or she is about 9 inches long and weighs about 1 pounds. You will likely feel the baby move (quickening) between 18 and 20 weeks of pregnancy. Follow these instructions at home:  Avoid all smoking, herbs, and alcohol. Avoid drugs not approved by your doctor.  Do not use any tobacco products, including cigarettes, chewing tobacco, and electronic cigarettes. If you need help quitting, ask your doctor. You may get counseling or other support to help you quit.  Only take medicine as told by your doctor. Some medicines are safe and some are not during pregnancy.  Exercise only as told by your doctor. Stop exercising if you start having cramps.  Eat regular, healthy meals.  Wear a good support bra if your breasts are tender.  Do not use hot tubs, steam rooms, or saunas.  Wear your seat belt when driving.  Avoid raw meat, uncooked cheese, and liter boxes and soil used by cats.  Take your prenatal vitamins.  Take 1500-2000 milligrams of calcium daily starting at the 20th week of pregnancy until you deliver your baby.  Try taking medicine that helps you poop (stool softener) as needed, and if your doctor approves. Eat more fiber by eating fresh fruit, vegetables, and whole grains. Drink enough fluids to keep your pee (urine) clear or pale yellow.  Take warm water baths (sitz baths) to soothe pain or discomfort caused by hemorrhoids. Use hemorrhoid cream if your doctor approves.  If you have puffy, bulging veins (varicose veins), wear support hose. Raise (elevate) your feet for 15 minutes, 3-4 times a day. Limit salt in your diet.  Avoid heavy  lifting, wear low heals, and sit up straight.  Rest with your legs raised if you have leg cramps or low back pain.  Visit your dentist if you have not gone during your pregnancy. Use a soft toothbrush to brush your teeth. Be gentle when you floss.  You can have sex (intercourse) unless your doctor tells you not to.  Go to your doctor visits. Get help if:  You feel dizzy.  You have mild cramps or pressure in your lower belly (abdomen).  You have a nagging pain in your belly area.  You continue to feel sick to your stomach (nauseous), throw up (vomit), or have watery poop (diarrhea).  You have bad smelling fluid coming from your vagina.  You have pain with peeing (urination). Get help right away if:  You have a fever.  You are leaking fluid from your vagina.  You have spotting or bleeding from your vagina.  You have severe belly cramping or pain.  You lose or gain weight rapidly.  You have trouble catching your breath and have chest pain.  You notice sudden or extreme puffiness (swelling) of your face, hands, ankles, feet, or legs.  You have not felt the baby move in over an hour.  You have severe headaches that do not go away with medicine.  You have vision changes. This information is not intended to replace advice given to you by your health care provider. Make sure you discuss any questions you have with your health care   provider. Document Released: 07/12/2009 Document Revised: 09/23/2015 Document Reviewed: 06/18/2012 Elsevier Interactive Patient Education  2017 [employer]  

## 2018-03-04 LAB — INTEGRATED 2
AFP MARKER: 45.2 ng/mL
AFP MoM: 1.39
CROWN RUMP LENGTH: 71.2 mm
DIA MOM: 0.68
DIA Value: 100.5 pg/mL
Estriol, Unconjugated: 1.07 ng/mL
GESTATIONAL AGE: 17.1 wk
Gest. Age on Collection Date: 13.1 weeks
HCG MOM: 0.83
Maternal Age at EDD: 22.9 yr
Nuchal Translucency (NT): 1.4 mm
Nuchal Translucency MoM: 0.86
Number of Fetuses: 1
PAPP-A MOM: 1.12
PAPP-A VALUE: 1064.7 ng/mL
TEST RESULTS: NEGATIVE
Weight: 177 [lb_av]
Weight: 177 [lb_av]
hCG Value: 21.3 IU/mL
uE3 MoM: 1.01

## 2018-03-15 ENCOUNTER — Ambulatory Visit (INDEPENDENT_AMBULATORY_CARE_PROVIDER_SITE_OTHER): Payer: Medicaid Other

## 2018-03-15 ENCOUNTER — Encounter: Payer: Self-pay | Admitting: Women's Health

## 2018-03-15 ENCOUNTER — Ambulatory Visit (INDEPENDENT_AMBULATORY_CARE_PROVIDER_SITE_OTHER): Payer: Medicaid Other | Admitting: Women's Health

## 2018-03-15 VITALS — BP 124/86 | HR 90 | Temp 98.2°F | Wt 183.5 lb

## 2018-03-15 DIAGNOSIS — Z3402 Encounter for supervision of normal first pregnancy, second trimester: Secondary | ICD-10-CM

## 2018-03-15 DIAGNOSIS — Z1389 Encounter for screening for other disorder: Secondary | ICD-10-CM

## 2018-03-15 DIAGNOSIS — Z363 Encounter for antenatal screening for malformations: Secondary | ICD-10-CM | POA: Diagnosis not present

## 2018-03-15 DIAGNOSIS — Z331 Pregnant state, incidental: Secondary | ICD-10-CM

## 2018-03-15 LAB — POCT URINALYSIS DIPSTICK OB
Glucose, UA: NEGATIVE
KETONES UA: NEGATIVE
Leukocytes, UA: NEGATIVE
Nitrite, UA: NEGATIVE
POC,PROTEIN,UA: NEGATIVE
RBC UA: NEGATIVE

## 2018-03-15 NOTE — Progress Notes (Signed)
US 19+1 wks,breech,posterior placenta gr 0,normal ovaries bilat,cx 3.9 cm,fhr 144 bpm,svp of fluid 4.9 cm,efw 314 g 82%,anatomy complete,no obvious abnormalities

## 2018-03-15 NOTE — Patient Instructions (Addendum)
Martha Herrera, I greatly value your feedback.  If you receive a survey following your visit with Martha Herrera today, we appreciate you taking the time to fill it out.  Thanks, Martha HaffKim Booker, CNM, WHNP-BC  For your lower back pain you may:  Purchase a pregnancy belt from [employer], [employer], [employer], etc and wear it while you are up and about  Take warm baths  Use a heating pad to your lower back for no longer than 20 minutes at a time, and do not place near abdomen  Take tylenol as needed. Please follow directions on the bottle  Kinesiology tape (can get from sporting goods store), google how to tape belly for pregnancy   Congestion: Humidifier Saline nasal spray Sudafed   Second Trimester of Pregnancy The second trimester is from week 14 through week 27 (months 4 through 6). The second trimester is often a time when you feel your best. Your body has adjusted to being pregnant, and you begin to feel better physically. Usually, morning sickness has lessened or quit completely, you may have more energy, and you may have an increase in appetite. The second trimester is also a time when the fetus is growing rapidly. At the end of the sixth month, the fetus is about 9 inches long and weighs about 1 pounds. You will likely begin to feel the baby move (quickening) between 16 and 20 weeks of pregnancy. Body changes during your second trimester Your body continues to go through many changes during your second trimester. The changes vary from woman to woman.  Your weight will continue to increase. You will notice your lower abdomen bulging out.  You may begin to get stretch marks on your hips, abdomen, and breasts.  You may develop headaches that can be relieved by medicines. The medicines should be approved by your health care provider.  You may urinate more often because the fetus is pressing on your bladder.  You may develop or continue to have heartburn as a result of your pregnancy.  You  may develop constipation because certain hormones are causing the muscles that push waste through your intestines to slow down.  You may develop hemorrhoids or swollen, bulging veins (varicose veins).  You may have back pain. This is caused by: ? Weight gain. ? Pregnancy hormones that are relaxing the joints in your pelvis. ? A shift in weight and the muscles that support your balance.  Your breasts will continue to grow and they will continue to become tender.  Your gums may bleed and may be sensitive to brushing and flossing.  Dark spots or blotches (chloasma, mask of pregnancy) may develop on your face. This will likely fade after the baby is born.  A dark line from your belly button to the pubic area (linea nigra) may appear. This will likely fade after the baby is born.  You may have changes in your hair. These can include thickening of your hair, rapid growth, and changes in texture. Some women also have hair loss during or after pregnancy, or hair that feels dry or thin. Your hair will most likely return to normal after your baby is born.  What to expect at prenatal visits During a routine prenatal visit:  You will be weighed to make sure you and the fetus are growing normally.  Your blood pressure will be taken.  Your abdomen will be measured to track your baby's growth.  The fetal heartbeat will be listened to.  Any test results from  the previous visit will be discussed.  Your health care provider may ask you:  How you are feeling.  If you are feeling the baby move.  If you have had any abnormal symptoms, such as leaking fluid, bleeding, severe headaches, or abdominal cramping.  If you are using any tobacco products, including cigarettes, chewing tobacco, and electronic cigarettes.  If you have any questions.  Other tests that may be performed during your second trimester include:  Blood tests that check for: ? Low iron levels (anemia). ? High blood sugar that  affects pregnant women (gestational diabetes) between 9 and 28 weeks. ? Rh antibodies. This is to check for a protein on red blood cells (Rh factor).  Urine tests to check for infections, diabetes, or protein in the urine.  An ultrasound to confirm the proper growth and development of the baby.  An amniocentesis to check for possible genetic problems.  Fetal screens for spina bifida and Down syndrome.  HIV (human immunodeficiency virus) testing. Routine prenatal testing includes screening for HIV, unless you choose not to have this test.  Follow these instructions at home: Medicines  Follow your health care provider's instructions regarding medicine use. Specific medicines may be either safe or unsafe to take during pregnancy.  Take a prenatal vitamin that contains at least 600 micrograms (mcg) of folic acid.  If you develop constipation, try taking a stool softener if your health care provider approves. Eating and drinking  Eat a balanced diet that includes fresh fruits and vegetables, whole grains, good sources of protein such as meat, eggs, or tofu, and low-fat dairy. Your health care provider will help you determine the amount of weight gain that is right for you.  Avoid raw meat and uncooked cheese. These carry germs that can cause birth defects in the baby.  If you have low calcium intake from food, talk to your health care provider about whether you should take a daily calcium supplement.  Limit foods that are high in fat and processed sugars, such as fried and sweet foods.  To prevent constipation: ? Drink enough fluid to keep your urine clear or pale yellow. ? Eat foods that are high in fiber, such as fresh fruits and vegetables, whole grains, and beans. Activity  Exercise only as directed by your health care provider. Most women can continue their usual exercise routine during pregnancy. Try to exercise for 30 minutes at least 5 days a week. Stop exercising if you  experience uterine contractions.  Avoid heavy lifting, wear low heel shoes, and practice good posture.  A sexual relationship may be continued unless your health care provider directs you otherwise. Relieving pain and discomfort  Wear a good support bra to prevent discomfort from breast tenderness.  Take warm sitz baths to soothe any pain or discomfort caused by hemorrhoids. Use hemorrhoid cream if your health care provider approves.  Rest with your legs elevated if you have leg cramps or low back pain.  If you develop varicose veins, wear support hose. Elevate your feet for 15 minutes, 3-4 times a day. Limit salt in your diet. Prenatal Care  Write down your questions. Take them to your prenatal visits.  Keep all your prenatal visits as told by your health care provider. This is important. Safety  Wear your seat belt at all times when driving.  Make a list of emergency phone numbers, including numbers for family, friends, the hospital, and police and fire departments. General instructions  Ask your health care provider  for a referral to a local prenatal education class. Begin classes no later than the beginning of month 6 of your pregnancy.  Ask for help if you have counseling or nutritional needs during pregnancy. Your health care provider can offer advice or refer you to specialists for help with various needs.  Do not use hot tubs, steam rooms, or saunas.  Do not douche or use tampons or scented sanitary pads.  Do not cross your legs for long periods of time.  Avoid cat litter boxes and soil used by cats. These carry germs that can cause birth defects in the baby and possibly loss of the fetus by miscarriage or stillbirth.  Avoid all smoking, herbs, alcohol, and unprescribed drugs. Chemicals in these products can affect the formation and growth of the baby.  Do not use any products that contain nicotine or tobacco, such as cigarettes and e-cigarettes. If you need help  quitting, ask your health care provider.  Visit your dentist if you have not gone yet during your pregnancy. Use a soft toothbrush to brush your teeth and be gentle when you floss. Contact a health care provider if:  You have dizziness.  You have mild pelvic cramps, pelvic pressure, or nagging pain in the abdominal area.  You have persistent nausea, vomiting, or diarrhea.  You have a bad smelling vaginal discharge.  You have pain when you urinate. Get help right away if:  You have a fever.  You are leaking fluid from your vagina.  You have spotting or bleeding from your vagina.  You have severe abdominal cramping or pain.  You have rapid weight gain or weight loss.  You have shortness of breath with chest pain.  You notice sudden or extreme swelling of your face, hands, ankles, feet, or legs.  You have not felt your baby move in over an hour.  You have severe headaches that do not go away when you take medicine.  You have vision changes. Summary  The second trimester is from week 14 through week 27 (months 4 through 6). It is also a time when the fetus is growing rapidly.  Your body goes through many changes during pregnancy. The changes vary from woman to woman.  Avoid all smoking, herbs, alcohol, and unprescribed drugs. These chemicals affect the formation and growth your baby.  Do not use any tobacco products, such as cigarettes, chewing tobacco, and e-cigarettes. If you need help quitting, ask your health care provider.  Contact your health care provider if you have any questions. Keep all prenatal visits as told by your health care provider. This is important. This information is not intended to replace advice given to you by your health care provider. Make sure you discuss any questions you have with your health care provider. Document Released: 04/11/2001 Document Revised: 09/23/2015 Document Reviewed: 06/18/2012 Elsevier Interactive Patient Education  2017  [employer].

## 2018-03-15 NOTE — Progress Notes (Signed)
   LOW-RISK PREGNANCY VISIT Patient name: Martha Herrera Mcdiarmid MRN [id number]  Date of birth: 07/10/1995 Chief Complaint:   Routine Prenatal Visit (US today; back pain; red patches on hands; congestion)  History of Present Illness:   Martha Herrera Trembley is a 22 y.o. G1P0000 female at 4862w1d with an Estimated Date of Delivery: 08/08/18 being seen today for ongoing management of a low-risk pregnancy.  Today she reports back pain, congestions, itchy red patches on hands. Contractions: Not present. Vag. Bleeding: None.  Movement: Absent. denies leaking of fluid. Review of Systems:   Pertinent items are noted in HPI Denies abnormal vaginal discharge w/ itching/odor/irritation, headaches, visual changes, shortness of breath, chest pain, abdominal pain, severe nausea/vomiting, or problems with urination or bowel movements unless otherwise stated above. Pertinent History Reviewed:  Reviewed past medical,surgical, social, obstetrical and family history.  Reviewed problem list, medications and allergies. Physical Assessment:   Vitals:   03/15/18 1047  BP: 124/86  Pulse: 90  Temp: 98.2 F (36.8 C)  Weight: 183 lb 8 oz (83.2 kg)  Body mass index is 36.75 kg/m.        Physical Examination:   General appearance: Well appearing, and in no distress  Mental status: Alert, oriented to person, place, and time  Skin: Warm & dry, dry skin of hands  Cardiovascular: Normal heart rate noted  Respiratory: Normal respiratory effort, no distress  Abdomen: Soft, gravid, nontender  Pelvic: Cervical exam deferred         Extremities: Edema: Trace  Fetal Status: Fetal Heart Rate (bpm): 144 u/s   Movement: Absent    US 19+1 wks,breech,posterior placenta gr 0,normal ovaries bilat,cx 3.9 cm,fhr 144 bpm,svp of fluid 4.9 cm,efw 314 g 82%,anatomy complete,no obvious abnormalities   Results for orders placed or performed in visit on 03/15/18 (from the past 24 hour(s))  POC Urinalysis Dipstick OB   Collection Time: 03/15/18 10:40  AM  Result Value Ref Range   Color, UA     Clarity, UA     Glucose, UA Negative Negative   Bilirubin, UA     Ketones, UA neg    Spec Grav, UA     Blood, UA neg    pH, UA     POC,PROTEIN,UA Negative Negative, Trace, Small (1+), Moderate (2+), Large (3+), 4+   Urobilinogen, UA     Nitrite, UA neg    Leukocytes, UA Negative Negative   Appearance     Odor      Assessment & Plan:  1) Low-risk pregnancy G1P0000 at 5262w1d with an Estimated Date of Delivery: 08/08/18   2) Congestion, humidifier, saline nasal spray, sudafed  3) Low back pain> gave printed prevention/relief measures   4) Dry hands> Aquaphor or Eucerin   Meds: No orders of the defined types were placed in this encounter.  Labs/procedures today: anatomy u/s  Plan:  Continue routine obstetrical care   Reviewed: Preterm labor symptoms and general obstetric precautions including but not limited to vaginal bleeding, contractions, leaking of fluid and fetal movement were reviewed in detail with the patient.  All questions were answered  Follow-up: Return in about 4 weeks (around 04/12/2018) for LROB.  Orders Placed This Encounter  Procedures  . POC Urinalysis Dipstick OB   Cheral MarkerKimberly R  CNM, [facility]NP[facility]BC 03/15/2018 11:08 AM

## 2018-04-05 ENCOUNTER — Telehealth: Payer: Self-pay | Admitting: Obstetrics & Gynecology

## 2018-04-05 MED ORDER — AZITHROMYCIN 250 MG PO TABS: ORAL_TABLET | ORAL | 0 refills | Status: DC

## 2018-04-05 NOTE — Telephone Encounter (Signed)
Patient states she is still experiencing head congestion. She has tried all of the recommendations from her last visit with no relieve.  Had a low grade temp 99.8 one time.  No other symptoms.  Has been using Afrin nasal spray BID for at least 3 weeks.  Spoke with K[facility] and she will send in antibiotic. Informed patient and advised to check back with pharmacy later on today. Verbalized understanding.

## 2018-04-05 NOTE — Addendum Note (Signed)
Addended by: Shawna ClampBOOKER, KIMBERLY R on: 04/05/2018 01:29 PM   Modules accepted: Orders

## 2018-04-05 NOTE — Telephone Encounter (Signed)
Patient called, stated that she as been using Afrin, but it's not working.  She stated every time she blows hers nose blood comes out.  She would like to use Mucinex Sinus Max.  [facility] [place]  [phone]

## 2018-04-12 ENCOUNTER — Ambulatory Visit (INDEPENDENT_AMBULATORY_CARE_PROVIDER_SITE_OTHER): Payer: Medicaid Other | Admitting: Obstetrics & Gynecology

## 2018-04-12 ENCOUNTER — Encounter: Payer: Self-pay | Admitting: Obstetrics & Gynecology

## 2018-04-12 VITALS — BP 116/73 | HR 94 | Wt 188.5 lb

## 2018-04-12 DIAGNOSIS — Z1389 Encounter for screening for other disorder: Secondary | ICD-10-CM

## 2018-04-12 DIAGNOSIS — Z3402 Encounter for supervision of normal first pregnancy, second trimester: Secondary | ICD-10-CM

## 2018-04-12 DIAGNOSIS — Z331 Pregnant state, incidental: Secondary | ICD-10-CM

## 2018-04-12 DIAGNOSIS — Z3A23 23 weeks gestation of pregnancy: Secondary | ICD-10-CM

## 2018-04-12 LAB — POCT URINALYSIS DIPSTICK OB
Blood, UA: NEGATIVE
GLUCOSE, UA: NEGATIVE
Ketones, UA: NEGATIVE
Leukocytes, UA: NEGATIVE
Nitrite, UA: NEGATIVE
POC,PROTEIN,UA: NEGATIVE

## 2018-04-12 NOTE — Progress Notes (Signed)
   LOW-RISK PREGNANCY VISIT Patient name: Martha Herrera Doren MRN [id number]  Date of birth: 09/18/1995 Chief Complaint:   Routine Prenatal Visit (feels bruised under armpits; congestion)  History of Present Illness:   Martha Herrera Barge is a 22 y.o. G1P0000 female at 6169w1d with an Estimated Date of Delivery: 08/08/18 being seen today for ongoing management of a low-risk pregnancy.  Today she reports no complaints. Contractions: Not present. Vag. Bleeding: None.  Movement: Present. denies leaking of fluid. Review of Systems:   Pertinent items are noted in HPI Denies abnormal vaginal discharge w/ itching/odor/irritation, headaches, visual changes, shortness of breath, chest pain, abdominal pain, severe nausea/vomiting, or problems with urination or bowel movements unless otherwise stated above. Pertinent History Reviewed:  Reviewed past medical,surgical, social, obstetrical and family history.  Reviewed problem list, medications and allergies. Physical Assessment:   Vitals:   04/12/18 0946  BP: 116/73  Pulse: 94  Weight: 188 lb 8 oz (85.5 kg)  Body mass index is 37.75 kg/m.        Physical Examination:   General appearance: Well appearing, and in no distress  Mental status: Alert, oriented to person, place, and time  Skin: Warm & dry  Cardiovascular: Normal heart rate noted  Respiratory: Normal respiratory effort, no distress  Abdomen: Soft, gravid, nontender  Pelvic: Cervical exam deferred         Extremities: Edema: Trace  Fetal Status: Fetal Heart Rate (bpm): 154 Fundal Height: 23 cm Movement: Present    Results for orders placed or performed in visit on 04/12/18 (from the past 24 hour(s))  POC Urinalysis Dipstick OB   Collection Time: 04/12/18  9:46 AM  Result Value Ref Range   Color, UA     Clarity, UA     Glucose, UA Negative Negative   Bilirubin, UA     Ketones, UA neg    Spec Grav, UA     Blood, UA neg    pH, UA     POC,PROTEIN,UA Negative Negative, Trace, Small (1+), Moderate  (2+), Large (3+), 4+   Urobilinogen, UA     Nitrite, UA neg    Leukocytes, UA Negative Negative   Appearance     Odor      Assessment & Plan:  1) Low-risk pregnancy G1P0000 at 8269w1d with an Estimated Date of Delivery: 08/08/18   2) Viral URI, take OTC decongestant such as alka seltzer plus   Meds: No orders of the defined types were placed in this encounter.  Labs/procedures today:   Plan:  Continue routine obstetrical care   Reviewed: Preterm labor symptoms and general obstetric precautions including but not limited to vaginal bleeding, contractions, leaking of fluid and fetal movement were reviewed in detail with the patient.  All questions were answered  Follow-up: Return in about 4 weeks (around 05/10/2018) for PN2.  Orders Placed This Encounter  Procedures  . POC Urinalysis Dipstick OB   Amaryllis DykeLuther H   04/12/2018 10:03 AM

## 2018-04-15 ENCOUNTER — Telehealth: Payer: Self-pay | Admitting: Obstetrics & Gynecology

## 2018-04-15 NOTE — Telephone Encounter (Signed)
Would like a nurse to call her has a question

## 2018-04-15 NOTE — Telephone Encounter (Signed)
Patient states she was advised to take Alka-Seltzer Plus for decongestant.  States all contain Ibuprofen and wanted to verify if it was ok.  Informed it was ok for her to take. Pt verbalized understanding.

## 2018-04-29 ENCOUNTER — Inpatient Hospital Stay (HOSPITAL_COMMUNITY)
Admission: AD | Admit: 2018-04-29 | Discharge: 2018-04-29 | Disposition: A | Discharge status: Home / Self Care | Payer: Medicaid Other | Source: Ambulatory Visit | Attending: Obstetrics & Gynecology | Admitting: Obstetrics & Gynecology

## 2018-04-29 ENCOUNTER — Telehealth: Payer: Self-pay | Admitting: Women's Health

## 2018-04-29 ENCOUNTER — Other Ambulatory Visit: Payer: Self-pay

## 2018-04-29 ENCOUNTER — Encounter (HOSPITAL_COMMUNITY): Payer: Self-pay

## 2018-04-29 DIAGNOSIS — Z79899 Other long term (current) drug therapy: Secondary | ICD-10-CM | POA: Insufficient documentation

## 2018-04-29 DIAGNOSIS — O26892 Other specified pregnancy related conditions, second trimester: Secondary | ICD-10-CM | POA: Diagnosis not present

## 2018-04-29 DIAGNOSIS — R112 Nausea with vomiting, unspecified: Secondary | ICD-10-CM

## 2018-04-29 DIAGNOSIS — Z3A25 25 weeks gestation of pregnancy: Secondary | ICD-10-CM | POA: Insufficient documentation

## 2018-04-29 DIAGNOSIS — O212 Late vomiting of pregnancy: Secondary | ICD-10-CM | POA: Insufficient documentation

## 2018-04-29 DIAGNOSIS — K219 Gastro-esophageal reflux disease without esophagitis: Secondary | ICD-10-CM | POA: Insufficient documentation

## 2018-04-29 DIAGNOSIS — Z880 Allergy status to penicillin: Secondary | ICD-10-CM | POA: Insufficient documentation

## 2018-04-29 DIAGNOSIS — R197 Diarrhea, unspecified: Secondary | ICD-10-CM | POA: Diagnosis not present

## 2018-04-29 LAB — URINALYSIS, ROUTINE W REFLEX MICROSCOPIC
Bilirubin Urine: NEGATIVE
Glucose, UA: NEGATIVE mg/dL
Hgb urine dipstick: NEGATIVE
Ketones, ur: 80 mg/dL — AB
Leukocytes, UA: NEGATIVE
Nitrite: NEGATIVE
PH: 5 (ref 5.0–8.0)
Protein, ur: 30 mg/dL — AB
SPECIFIC GRAVITY, URINE: 1.029 (ref 1.005–1.030)

## 2018-04-29 MED ORDER — ONDANSETRON 4 MG PO TBDP: 4.0000 mg | ORAL_TABLET | Freq: Three times a day (TID) | ORAL | 0 refills | Status: DC | PRN

## 2018-04-29 MED ORDER — ACETAMINOPHEN 500 MG PO TABS
1000.0000 mg | ORAL_TABLET | Freq: Once | ORAL | Status: AC
  Administered 2018-04-29: 1000 mg via ORAL
  Filled 2018-04-29: qty 2

## 2018-04-29 MED ORDER — SODIUM CHLORIDE 0.9 % IV SOLN
8.0000 mg | Freq: Once | INTRAVENOUS | Status: AC
  Administered 2018-04-29: 8 mg via INTRAVENOUS
  Filled 2018-04-29: qty 4

## 2018-04-29 MED ORDER — LACTATED RINGERS IV BOLUS
1000.0000 mL | Freq: Once | INTRAVENOUS | Status: AC
  Administered 2018-04-29: 1000 mL via INTRAVENOUS

## 2018-04-29 MED ORDER — M.V.I. ADULT IV INJ
Freq: Once | INTRAVENOUS | Status: AC
  Administered 2018-04-29: 20:00:00 via INTRAVENOUS
  Filled 2018-04-29: qty 10

## 2018-04-29 NOTE — MAU Note (Signed)
Pt reports  Vomiting since this am, diarrhea x 4 today

## 2018-04-29 NOTE — MAU Provider Note (Addendum)
History     CSN: [id number]  Arrival date and time: 04/29/18 1646  Provider's first contact with patient at 2000   Chief Complaint  Patient presents with  . Nausea  . Emesis   HPI  Ms.  Martha Herrera is a 22 y.o. year old 251P0000 female at 636w4d weeks gestation who presents to MAU reporting N/V since this AM and diarrhea x 4 times today. She takes Diclegis for N/V, but it has not helped today. [facility]  *She now has complaint of "migraine H/A" after receiving 2 liters of IVFs. Requesting Tylenol for her H/A.   Past Medical History:  Diagnosis Date  . Gastroesophageal reflux    takes prescribed meds daily  . Headache(784.0)    not as often  . Heart murmur   . Pneumonia 2013  . Varicella     Past Surgical History:  Procedure Laterality Date  . ESOPHAGOGASTRODUODENOSCOPY  05/03/2012   Procedure: ESOPHAGOGASTRODUODENOSCOPY (EGD);  Surgeon: Jon GillsJoseph H Clark, MD;  [facility];  Service: Gastroenterology;  Laterality: N/A;  . tubes in ears    . WISDOM TOOTH EXTRACTION      Family History  Problem Relation Age of Onset  . GER disease Father   . Cancer Father        kidney  . Asthma Brother   . Miscarriages / [place] Brother   . GER disease Maternal Grandmother   . Hyperlipidemia Maternal Grandmother   . ADD / ADHD Brother   . Miscarriages / [place] Mother   . Headache Mother   . Cancer Maternal Grandfather   . Diabetes Maternal Grandfather   . Hyperlipidemia Maternal Grandfather   . Vision loss Maternal Grandfather   . GER disease Maternal Aunt   . Migraines Maternal Aunt   . Anxiety disorder Maternal Aunt   . Diabetes Paternal Aunt   . Cancer Paternal Uncle        spinal cancer  . Ataxia Neg Hx   . Chorea Neg Hx   . Dementia Neg Hx   . Mental retardation Neg Hx   . Multiple sclerosis Neg Hx   . Neurofibromatosis Neg Hx   . Neuropathy Neg Hx   . Parkinsonism Neg Hx   . Seizures Neg Hx   . Stroke Neg Hx   . Depression  Neg Hx   . Bipolar disorder Neg Hx     Social History   Tobacco Use  . Smoking status: Never Smoker  . Smokeless tobacco: Never Used  Substance Use Topics  . Alcohol use: No  . Drug use: No    Allergies:  Allergies  Allergen Reactions  . Penicillins Hives, Itching and Rash  . Amoxil [Amoxicillin]     Medications Prior to Admission  Medication Sig Dispense Refill Last Dose  . acetaminophen (TYLENOL) 500 MG tablet Take 1,000 mg by mouth every 6 (six) hours as needed.   Taking  . Doxylamine-Pyridoxine (DICLEGIS) 10-10 MG TBEC 2 tabs q hs, if sx persist add 1 tab q am on day 3, if sx persist add 1 tab q afternoon on day 4 100 tablet 6 Taking  . omeprazole (PRILOSEC) 40 MG capsule Take 1 capsule (40 mg total) by mouth daily. 30 capsule 6 Taking  . Prenatal MV-Min-FA-Omega-3 (PRENATAL GUMMIES/DHA & FA PO) Take by mouth.   Taking    Review of Systems  Constitutional: Negative.   HENT: Negative.   Eyes: Negative.   Respiratory: Negative.   Cardiovascular:  Negative.   Gastrointestinal: Positive for diarrhea (4 times today), nausea and vomiting.  Endocrine: Negative.   Genitourinary: Negative.   Musculoskeletal: Negative.   Skin: Negative.   Allergic/Immunologic: Negative.   Neurological: Positive for dizziness and weakness.  Hematological: Negative.   Psychiatric/Behavioral: Negative.    Physical Exam   Blood pressure 128/67, pulse (!) 110, temperature 98.7 F (37.1 C), temperature source Oral, resp. rate 15, height 4\' 10"  (1.473 m), weight 83 kg, SpO2 100 %.  Physical Exam  Nursing note and vitals reviewed. Constitutional: She is oriented to person, place, and time. She appears well-developed and well-nourished.  HENT:  Head: Normocephalic and atraumatic.  Eyes: Pupils are equal, round, and reactive to light.  Neck: Normal range of motion.  Cardiovascular: Normal rate.  Respiratory: Effort normal.  GI: Soft.  Musculoskeletal: Normal range of motion.  Neurological:  She is alert and oriented to person, place, and time.  Skin: Skin is warm and dry.  Psychiatric: She has a normal mood and affect. Her behavior is normal. Judgment and thought content normal.    MAU Course  Procedures  MDM CCUA  IVFs: LR 1000 ml @ 999 ml/hr, Zofran 8 mg IVPB; followed by MVI in LR 1000 ml @ 500 ml/hr -- resoved nausea/vomiting PO Challenge -- patient tolerated well NST - FHR: 150 bpm / moderate variability / accels present / decels absent / TOCO: none   Results for orders placed or performed during the hospital encounter of 04/29/18 (from the past 24 hour(s))  Urinalysis, Routine w reflex microscopic     Status: Abnormal   Collection Time: 04/29/18  6:46 PM  Result Value Ref Range   Color, Urine YELLOW YELLOW   APPearance HAZY (A) CLEAR   Specific Gravity, Urine 1.029 1.005 - 1.030   pH 5.0 5.0 - 8.0   Glucose, UA NEGATIVE NEGATIVE mg/dL   Hgb urine dipstick NEGATIVE NEGATIVE   Bilirubin Urine NEGATIVE NEGATIVE   Ketones, ur 80 (A) NEGATIVE mg/dL   Protein, ur 30 (A) NEGATIVE mg/dL   Nitrite NEGATIVE NEGATIVE   Leukocytes, UA NEGATIVE NEGATIVE   RBC / HPF 0-5 0 - 5 RBC/hpf   WBC, UA 0-5 0 - 5 WBC/hpf   Bacteria, UA RARE (A) NONE SEEN   Squamous Epithelial / LPF 11-20 0 - 5   Mucus PRESENT     Assessment and Plan  Nausea vomiting and diarrhea - Plan: Discharge patient - Rx for Zofran 4 mg ODT sent to pharmacy - Information provided on N/V/D, rotavirus - Keep scheduled appt on 05/10/18 with [facility]  - Patient verbalized an understanding of the plan of care and agrees.   Raelyn Moraolitta Siegfried Vieth, MSN, CNM 04/29/2018, 8:37 PM

## 2018-04-29 NOTE — Telephone Encounter (Signed)
Patient states she is still having nausea and vomiting despite taking nausea medication.  Feels dehydrated so [facility].

## 2018-04-29 NOTE — Telephone Encounter (Signed)
Please call pt she was told to call back if she still had nausea[place] [facility] [place] Please call pt and advise

## 2018-04-29 NOTE — Telephone Encounter (Signed)
Pt is 25 weeks has been vomiting since last night 3 times/ please call pt and advise

## 2018-04-29 NOTE — Telephone Encounter (Signed)
Patient states she has been vomiting this morning. Took her Prilosec but it came back up.  She has not tried taking the Diclegis. Advised to try taking Diclegis to see if that would help along with just sips of fluids to avoid dehydration. If Diclegis does not help, to call us back and we can send something different to pharmacy.  Verbalized understanding.

## 2018-05-10 ENCOUNTER — Ambulatory Visit (INDEPENDENT_AMBULATORY_CARE_PROVIDER_SITE_OTHER): Payer: Medicaid Other | Admitting: Women's Health

## 2018-05-10 ENCOUNTER — Encounter: Payer: Self-pay | Admitting: Women's Health

## 2018-05-10 ENCOUNTER — Other Ambulatory Visit: Payer: Self-pay

## 2018-05-10 ENCOUNTER — Other Ambulatory Visit: Payer: Medicaid Other

## 2018-05-10 VITALS — BP 131/74 | HR 99 | Wt 191.0 lb

## 2018-05-10 DIAGNOSIS — Z331 Pregnant state, incidental: Secondary | ICD-10-CM

## 2018-05-10 DIAGNOSIS — Z23 Encounter for immunization: Secondary | ICD-10-CM | POA: Diagnosis not present

## 2018-05-10 DIAGNOSIS — Z1389 Encounter for screening for other disorder: Secondary | ICD-10-CM

## 2018-05-10 DIAGNOSIS — Z3A27 27 weeks gestation of pregnancy: Secondary | ICD-10-CM

## 2018-05-10 DIAGNOSIS — Z3402 Encounter for supervision of normal first pregnancy, second trimester: Secondary | ICD-10-CM | POA: Diagnosis not present

## 2018-05-10 LAB — POCT URINALYSIS DIPSTICK OB
Blood, UA: NEGATIVE
Glucose, UA: NEGATIVE
Ketones, UA: NEGATIVE
Leukocytes, UA: NEGATIVE
Nitrite, UA: NEGATIVE
POC,PROTEIN,UA: NEGATIVE

## 2018-05-10 NOTE — Progress Notes (Signed)
   LOW-RISK PREGNANCY VISIT Patient name: Martha Herrera MRN [id number]  Date of birth: [birth year] Chief Complaint:   Routine Prenatal Visit (PN2 today)  History of Present Illness:   Martha Herrera is a 23 y.o. G1P0000 female at [redacted]w[redacted]d with an Estimated Date of Delivery: 08/08/18 being seen today for ongoing management of a low-risk pregnancy.  Today she reports some constipation. Contractions: Not present. Vag. Bleeding: None.  Movement: Present. denies leaking of fluid. Review of Systems:   Pertinent items are noted in HPI Denies abnormal vaginal discharge w/ itching/odor/irritation, headaches, visual changes, shortness of breath, chest pain, abdominal pain, severe nausea/vomiting, or problems with urination or bowel movements unless otherwise stated above. Pertinent History Reviewed:  Reviewed past medical,surgical, social, obstetrical and family history.  Reviewed problem list, medications and allergies. Physical Assessment:   Vitals:   05/10/18 0907  BP: 131/74  Pulse: 99  Weight: 191 lb (86.6 kg)  Body mass index is 39.92 kg/m.        Physical Examination:   General appearance: Well appearing, and in no distress  Mental status: Alert, oriented to person, place, and time  Skin: Warm & dry  Cardiovascular: Normal heart rate noted  Respiratory: Normal respiratory effort, no distress  Abdomen: Soft, gravid, nontender  Pelvic: Cervical exam deferred         Extremities: Edema: Trace  Fetal Status: Fetal Heart Rate (bpm): 155 Fundal Height: 29 cm Movement: Present    Results for orders placed or performed in visit on 05/10/18 (from the past 24 hour(s))  POC Urinalysis Dipstick OB   Collection Time: 05/10/18  9:07 AM  Result Value Ref Range   Color, UA     Clarity, UA     Glucose, UA Negative Negative   Bilirubin, UA     Ketones, UA neg    Spec Grav, UA     Blood, UA neg    pH, UA     POC,PROTEIN,UA Negative Negative, Trace, Small (1+), Moderate (2+), Large (3+), 4+   Urobilinogen, UA     Nitrite, UA neg    Leukocytes, UA Negative Negative   Appearance     Odor      Assessment & Plan:  1) Low-risk pregnancy G1P0000 at [redacted]w[redacted]d with an Estimated Date of Delivery: 08/08/18   2) Constipation, gave printed prevention/relief measures    Meds: No orders of the defined types were placed in this encounter.  Labs/procedures today: pn2, tdap  Plan:  Continue routine obstetrical care   Reviewed: Preterm labor symptoms and general obstetric precautions including but not limited to vaginal bleeding, contractions, leaking of fluid and fetal movement were reviewed in detail with the patient.  All questions were answered  Follow-up: Return in about 4 weeks (around 06/07/2018) for LROB.  Orders Placed This Encounter  Procedures  . Tdap vaccine greater than or equal to 7yo IM  . POC Urinalysis Dipstick OB   Cheral Marker CNM, [facility] 05/10/2018 9:30 AM

## 2018-05-10 NOTE — Patient Instructions (Addendum)
Martha ElksKelly T Lumpkin, I greatly value your feedback.  If you receive a survey following your visit with us today, we appreciate you taking the time to fill it out.  Thanks, Joellyn HaffKim Osmin Welz, CNM, WHNP-BC   Call the office [phone]) or go to [facility] if:  You begin to have strong, frequent contractions  Your water breaks.  Sometimes it is a big gush of fluid, sometimes it is just a trickle that keeps getting your panties wet or running down your legs  You have vaginal bleeding.  It is normal to have a small amount of spotting if your cervix was checked.   You don't feel your baby moving like normal.  If you don't, get you something to eat and drink and lay down and focus on feeling your baby move.  You should feel at least 10 movements in 2 hours.  If you don't, you should call the office or go to [facility].    Tdap Vaccine  It is recommended that you get the Tdap vaccine during the third trimester of EACH pregnancy to help protect your baby from getting pertussis (whooping cough)  27-36 weeks is the BEST time to do this so that you can pass the protection on to your baby. During pregnancy is better than after pregnancy, but if you are unable to get it during pregnancy it will be offered at the hospital.   You can get this vaccine with us, at the health department, your family doctor, or some local pharmacies  Everyone who will be around your baby should also be up-to-date on their vaccines before the baby comes. Adults (who are not pregnant) only need 1 dose of Tdap during adulthood.   Constipation  Drink plenty of fluid, preferably water, throughout the day  Eat foods high in fiber such as fruits, vegetables, and grains  Exercise, such as walking, is a good way to keep your bowels regular  Drink warm fluids, especially warm prune juice, or decaf coffee  Eat a 1/2 cup of real oatmeal (not instant), 1/2 cup applesauce, and 1/2-1 cup warm prune juice every day  If needed, you may  take Colace (docusate sodium) stool softener once or twice a day to help keep the stool soft. If you are pregnant, wait until you are out of your first trimester (12-14 weeks of pregnancy)  If you still are having problems with constipation, you may take Miralax once daily as needed to help keep your bowels regular.  If you are pregnant, wait until you are out of your first trimester (12-14 weeks of pregnancy)     Third Trimester of Pregnancy The third trimester is from week 29 through week 42, months 7 through 9. The third trimester is a time when the fetus is growing rapidly. At the end of the ninth month, the fetus is about 20 inches in length and weighs 6-10 pounds.  BODY CHANGES Your body goes through many changes during pregnancy. The changes vary from woman to woman.   Your weight will continue to increase. You can expect to gain 25-35 pounds (11-16 kg) by the end of the pregnancy.  You may begin to get stretch marks on your hips, abdomen, and breasts.  You may urinate more often because the fetus is moving lower into your pelvis and pressing on your bladder.  You may develop or continue to have heartburn as a result of your pregnancy.  You may develop constipation because certain hormones are causing the muscles that  push waste through your intestines to slow down.  You may develop hemorrhoids or swollen, bulging veins (varicose veins).  You may have pelvic pain because of the weight gain and pregnancy hormones relaxing your joints between the bones in your pelvis. Backaches may result from overexertion of the muscles supporting your posture.  You may have changes in your hair. These can include thickening of your hair, rapid growth, and changes in texture. Some women also have hair loss during or after pregnancy, or hair that feels dry or thin. Your hair will most likely return to normal after your baby is born.  Your breasts will continue to grow and be tender. A yellow discharge  may leak from your breasts called colostrum.  Your belly button may stick out.  You may feel short of breath because of your expanding uterus.  You may notice the fetus "dropping," or moving lower in your abdomen.  You may have a bloody mucus discharge. This usually occurs a few days to a week before labor begins.  Your cervix becomes thin and soft (effaced) near your due date. WHAT TO EXPECT AT YOUR PRENATAL EXAMS  You will have prenatal exams every 2 weeks until week 36. Then, you will have weekly prenatal exams. During a routine prenatal visit:  You will be weighed to make sure you and the fetus are growing normally.  Your blood pressure is taken.  Your abdomen will be measured to track your baby's growth.  The fetal heartbeat will be listened to.  Any test results from the previous visit will be discussed.  You may have a cervical check near your due date to see if you have effaced. At around 36 weeks, your caregiver will check your cervix. At the same time, your caregiver will also perform a test on the secretions of the vaginal tissue. This test is to determine if a type of bacteria, Group B streptococcus, is present. Your caregiver will explain this further. Your caregiver may ask you:  What your birth plan is.  How you are feeling.  If you are feeling the baby move.  If you have had any abnormal symptoms, such as leaking fluid, bleeding, severe headaches, or abdominal cramping.  If you have any questions. Other tests or screenings that may be performed during your third trimester include:  Blood tests that check for low iron levels (anemia).  Fetal testing to check the health, activity level, and growth of the fetus. Testing is done if you have certain medical conditions or if there are problems during the pregnancy. FALSE LABOR You may feel small, irregular contractions that eventually go away. These are called Braxton Hicks contractions, or false labor.  Contractions may last for hours, days, or even weeks before true labor sets in. If contractions come at regular intervals, intensify, or become painful, it is best to be seen by your caregiver.  SIGNS OF LABOR   Menstrual-like cramps.  Contractions that are 5 minutes apart or less.  Contractions that start on the top of the uterus and spread down to the lower abdomen and back.  A sense of increased pelvic pressure or back pain.  A watery or bloody mucus discharge that comes from the vagina. If you have any of these signs before the 37th week of pregnancy, call your caregiver right away. You need to go to the hospital to get checked immediately. HOME CARE INSTRUCTIONS   Avoid all smoking, herbs, alcohol, and unprescribed drugs. These chemicals affect the formation and  growth of the baby.  Follow your caregiver's instructions regarding medicine use. There are medicines that are either safe or unsafe to take during pregnancy.  Exercise only as directed by your caregiver. Experiencing uterine cramps is a good sign to stop exercising.  Continue to eat regular, healthy meals.  Wear a good support bra for breast tenderness.  Do not use hot tubs, steam rooms, or saunas.  Wear your seat belt at all times when driving.  Avoid raw meat, uncooked cheese, cat litter boxes, and soil used by cats. These carry germs that can cause birth defects in the baby.  Take your prenatal vitamins.  Try taking a stool softener (if your caregiver approves) if you develop constipation. Eat more high-fiber foods, such as fresh vegetables or fruit and whole grains. Drink plenty of fluids to keep your urine clear or pale yellow.  Take warm sitz baths to soothe any pain or discomfort caused by hemorrhoids. Use hemorrhoid cream if your caregiver approves.  If you develop varicose veins, wear support hose. Elevate your feet for 15 minutes, 3-4 times a day. Limit salt in your diet.  Avoid heavy lifting, wear low  heal shoes, and practice good posture.  Rest a lot with your legs elevated if you have leg cramps or low back pain.  Visit your dentist if you have not gone during your pregnancy. Use a soft toothbrush to brush your teeth and be gentle when you floss.  A sexual relationship may be continued unless your caregiver directs you otherwise.  Do not travel far distances unless it is absolutely necessary and only with the approval of your caregiver.  Take prenatal classes to understand, practice, and ask questions about the labor and delivery.  Make a trial run to the hospital.  Pack your hospital bag.  Prepare the baby's nursery.  Continue to go to all your prenatal visits as directed by your caregiver. SEEK MEDICAL CARE IF:  You are unsure if you are in labor or if your water has broken.  You have dizziness.  You have mild pelvic cramps, pelvic pressure, or nagging pain in your abdominal area.  You have persistent nausea, vomiting, or diarrhea.  You have a bad smelling vaginal discharge.  You have pain with urination. SEEK IMMEDIATE MEDICAL CARE IF:   You have a fever.  You are leaking fluid from your vagina.  You have spotting or bleeding from your vagina.  You have severe abdominal cramping or pain.  You have rapid weight loss or gain.  You have shortness of breath with chest pain.  You notice sudden or extreme swelling of your face, hands, ankles, feet, or legs.  You have not felt your baby move in over an hour.  You have severe headaches that do not go away with medicine.  You have vision changes. Document Released: 04/11/2001 Document Revised: 04/22/2013 Document Reviewed: 06/18/2012 [facility] Patient Information 2015 [place], [place]. This information is not intended to replace advice given to you by your health care provider. Make sure you discuss any questions you have with your health care provider.

## 2018-05-11 LAB — CBC
Hematocrit: 34.2 % (ref 34.0–46.6)
Hemoglobin: 11.5 g/dL (ref 11.1–15.9)
MCH: 27.6 pg (ref 26.6–33.0)
MCHC: 33.6 g/dL (ref 31.5–35.7)
MCV: 82 fL (ref 79–97)
PLATELETS: 323 10*3/uL (ref 150–450)
RBC: 4.17 x10E6/uL (ref 3.77–5.28)
RDW: 12.8 % (ref 11.7–15.4)
WBC: 12.7 10*3/uL — ABNORMAL HIGH (ref 3.4–10.8)

## 2018-05-11 LAB — RPR: RPR Ser Ql: NONREACTIVE

## 2018-05-11 LAB — HIV ANTIBODY (ROUTINE TESTING W REFLEX): HIV Screen 4th Generation wRfx: NONREACTIVE

## 2018-05-11 LAB — GLUCOSE TOLERANCE, 2 HOURS W/ 1HR
Glucose, 1 hour: 139 mg/dL (ref 65–179)
Glucose, 2 hour: 163 mg/dL — ABNORMAL HIGH (ref 65–152)
Glucose, Fasting: 73 mg/dL (ref 65–91)

## 2018-05-11 LAB — ANTIBODY SCREEN: Antibody Screen: NEGATIVE

## 2018-05-13 ENCOUNTER — Telehealth: Payer: Self-pay | Admitting: *Deleted

## 2018-05-13 ENCOUNTER — Other Ambulatory Visit: Payer: Self-pay | Admitting: Women's Health

## 2018-05-13 DIAGNOSIS — O24410 Gestational diabetes mellitus in pregnancy, diet controlled: Secondary | ICD-10-CM

## 2018-05-13 DIAGNOSIS — Z8632 Personal history of gestational diabetes: Secondary | ICD-10-CM | POA: Insufficient documentation

## 2018-05-13 MED ORDER — ACCU-CHEK GUIDE W/DEVICE KIT: 1.0000 | PACK | Freq: Four times a day (QID) | 0 refills | Status: DC

## 2018-05-13 MED ORDER — GLUCOSE BLOOD VI STRP: ORAL_STRIP | 12 refills | Status: DC

## 2018-05-13 MED ORDER — ACCU-CHEK FASTCLIX LANCETS MISC: 1.0000 | Freq: Four times a day (QID) | 12 refills | Status: DC

## 2018-05-13 NOTE — Telephone Encounter (Signed)
Patient informed she did not pass her sugar test. Referral sent to dietician along with prescription for meter, lancets and strips.  Will need to check blood sugar 4 times daily and log into babyscrips. Advised if she did not hear from the dietician within a week, to let us know. Pt verbalized understanding.

## 2018-05-22 ENCOUNTER — Encounter: Payer: Medicaid Other | Attending: Adult Health Nurse Practitioner | Admitting: Registered"

## 2018-05-22 DIAGNOSIS — O99810 Abnormal glucose complicating pregnancy: Secondary | ICD-10-CM | POA: Diagnosis not present

## 2018-05-23 ENCOUNTER — Encounter: Payer: Self-pay | Admitting: Registered"

## 2018-05-23 DIAGNOSIS — O99810 Abnormal glucose complicating pregnancy: Secondary | ICD-10-CM | POA: Insufficient documentation

## 2018-05-23 NOTE — Progress Notes (Signed)

## 2018-06-04 ENCOUNTER — Telehealth: Payer: Self-pay | Admitting: Women's Health

## 2018-06-04 NOTE — Telephone Encounter (Signed)
Patient called, stated she is 30 weeks and would like to know what she can take for stuffy nose, coughing, itchy and burning throat.  She stated that the last time she had these symptoms a zpack was prescribed an that never helps.  [facility] [place]  [phone]

## 2018-06-04 NOTE — Telephone Encounter (Signed)
Patient states she has a stuffy nose, cough, itchy and burning throat and wants to know what she can take over the counter.  Advised patient to refer to Babyscripts app for full list of medications but could take Robitussin, saline spray and use humidifier.  Advised it could be viral and would need to run its course 7-10 days.  Verbalized understanding.

## 2018-06-07 ENCOUNTER — Ambulatory Visit (INDEPENDENT_AMBULATORY_CARE_PROVIDER_SITE_OTHER): Payer: Medicaid Other | Admitting: Obstetrics & Gynecology

## 2018-06-07 VITALS — BP 116/77 | HR 102 | Wt 191.0 lb

## 2018-06-07 DIAGNOSIS — Z331 Pregnant state, incidental: Secondary | ICD-10-CM

## 2018-06-07 DIAGNOSIS — Z1389 Encounter for screening for other disorder: Secondary | ICD-10-CM

## 2018-06-07 DIAGNOSIS — Z3A31 31 weeks gestation of pregnancy: Secondary | ICD-10-CM

## 2018-06-07 DIAGNOSIS — O24410 Gestational diabetes mellitus in pregnancy, diet controlled: Secondary | ICD-10-CM

## 2018-06-07 DIAGNOSIS — O0990 Supervision of high risk pregnancy, unspecified, unspecified trimester: Secondary | ICD-10-CM

## 2018-06-07 DIAGNOSIS — O0993 Supervision of high risk pregnancy, unspecified, third trimester: Secondary | ICD-10-CM

## 2018-06-07 LAB — POCT URINALYSIS DIPSTICK OB
Blood, UA: NEGATIVE
Glucose, UA: NEGATIVE
Ketones, UA: NEGATIVE
Leukocytes, UA: NEGATIVE
Nitrite, UA: NEGATIVE
POC,PROTEIN,UA: NEGATIVE

## 2018-06-07 NOTE — Progress Notes (Signed)
   HIGH-RISK PREGNANCY VISIT Patient name: Martha Herrera MRN [id number]  Date of birth: March 09, 1996 Chief Complaint:   Routine Prenatal Visit  History of Present Illness:   Martha Herrera is a 23 y.o. G1P0000 female at [redacted]w[redacted]d with an Estimated Date of Delivery: 08/08/18 being seen today for ongoing management of a high-risk pregnancy complicated by gestational DM.  Today she reports no complaints. Contractions: Not present. Vag. Bleeding: None.  Movement: Present. denies leaking of fluid.  Review of Systems:   Pertinent items are noted in HPI Denies abnormal vaginal discharge w/ itching/odor/irritation, headaches, visual changes, shortness of breath, chest pain, abdominal pain, severe nausea/vomiting, or problems with urination or bowel movements unless otherwise stated above. Pertinent History Reviewed:  Reviewed past medical,surgical, social, obstetrical and family history.  Reviewed problem list, medications and allergies. Physical Assessment:   Vitals:   06/07/18 1034  BP: 116/77  Pulse: (!) 102  Weight: 191 lb (86.6 kg)  Body mass index is 39.92 kg/m.           Physical Examination:   General appearance: alert, well appearing, and in no distress  Mental status: alert, oriented to person, place, and time  Skin: warm & dry   Extremities: Edema: None    Cardiovascular: normal heart rate noted  Respiratory: normal respiratory effort, no distress  Abdomen: gravid, soft, non-tender  Pelvic: Cervical exam deferred         Fetal Status: Fetal Heart Rate (bpm): 145 Fundal Height: 33 cm Movement: Present    Fetal Surveillance Testing today: FHR   Results for orders placed or performed in visit on 06/07/18 (from the past 24 hour(s))  POC Urinalysis Dipstick OB   Collection Time: 06/07/18 10:37 AM  Result Value Ref Range   Color, UA     Clarity, UA     Glucose, UA Negative Negative   Bilirubin, UA     Ketones, UA neg    Spec Grav, UA     Blood, UA neg    pH, UA     POC,PROTEIN,UA  Negative Negative, Trace, Small (1+), Moderate (2+), Large (3+), 4+   Urobilinogen, UA     Nitrite, UA neg    Leukocytes, UA Negative Negative   Appearance     Odor      Assessment & Plan:  1) High-risk pregnancy G1P0000 at [redacted]w[redacted]d with an Estimated Date of Delivery: 08/08/18   2) Class A1 DM, stable, fastings are a touch higher than we would like, will follow closely, add pm med if needed    Meds: No orders of the defined types were placed in this encounter.   Labs/procedures today:   Treatment Plan:  EFW 36 weeks, IOL 40 weeks or as indicated   Reviewed: Preterm labor symptoms and general obstetric precautions including but not limited to vaginal bleeding, contractions, leaking of fluid and fetal movement were reviewed in detail with the patient.  All questions were answered.  Follow-up: Return in about 2 weeks (around 06/21/2018) for HROB.  Orders Placed This Encounter  Procedures  . POC Urinalysis Dipstick OB   Amaryllis Dyke Eure  06/07/2018 10:59 AM

## 2018-06-12 ENCOUNTER — Telehealth: Payer: Self-pay | Admitting: Obstetrics & Gynecology

## 2018-06-12 NOTE — Telephone Encounter (Signed)
Patient called, stated that she will [redacted] weeks pregnant tomorrow.  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  She wants to know if this will affect her or the baby.  [phone]

## 2018-06-12 NOTE — Telephone Encounter (Signed)
Patient informed that she does not need treatment for exposure to impetigo.  Verbalized understanding.

## 2018-06-21 ENCOUNTER — Encounter: Payer: Medicaid Other | Admitting: Women's Health

## 2018-06-21 ENCOUNTER — Ambulatory Visit (INDEPENDENT_AMBULATORY_CARE_PROVIDER_SITE_OTHER): Payer: Medicaid Other | Admitting: Obstetrics & Gynecology

## 2018-06-21 ENCOUNTER — Encounter: Payer: Self-pay | Admitting: Obstetrics & Gynecology

## 2018-06-21 VITALS — BP 107/74 | HR 96 | Wt 192.0 lb

## 2018-06-21 DIAGNOSIS — O0993 Supervision of high risk pregnancy, unspecified, third trimester: Secondary | ICD-10-CM

## 2018-06-21 DIAGNOSIS — Z1389 Encounter for screening for other disorder: Secondary | ICD-10-CM

## 2018-06-21 DIAGNOSIS — Z331 Pregnant state, incidental: Secondary | ICD-10-CM

## 2018-06-21 DIAGNOSIS — O0990 Supervision of high risk pregnancy, unspecified, unspecified trimester: Secondary | ICD-10-CM

## 2018-06-21 DIAGNOSIS — O24410 Gestational diabetes mellitus in pregnancy, diet controlled: Secondary | ICD-10-CM

## 2018-06-21 DIAGNOSIS — Z3A33 33 weeks gestation of pregnancy: Secondary | ICD-10-CM

## 2018-06-21 LAB — POCT URINALYSIS DIPSTICK OB
Blood, UA: NEGATIVE
Glucose, UA: NEGATIVE
Ketones, UA: NEGATIVE
Leukocytes, UA: NEGATIVE
NITRITE UA: NEGATIVE
PROTEIN: NEGATIVE

## 2018-06-21 NOTE — Progress Notes (Signed)
   HIGH-RISK PREGNANCY VISIT Patient name: Martha Herrera PLACE MRN [id number]  Date of birth: 1995/07/18 Chief Complaint:   High Risk Gestation (feet swelling)  History of Present Illness:   Martha Herrera is a 23 y.o. G1P0000 female at [redacted]w[redacted]d with an Estimated Date of Delivery: 08/08/18 being seen today for ongoing management of a high-risk pregnancy complicated by class  A1 DM.  Today she reports no complaints. Contractions: Irregular. Vag. Bleeding: None.  Movement: Present. denies leaking of fluid.  Review of Systems:   Pertinent items are noted in HPI Denies abnormal vaginal discharge w/ itching/odor/irritation, headaches, visual changes, shortness of breath, chest pain, abdominal pain, severe nausea/vomiting, or problems with urination or bowel movements unless otherwise stated above. Pertinent History Reviewed:  Reviewed past medical,surgical, social, obstetrical and family history.  Reviewed problem list, medications and allergies. Physical Assessment:   Vitals:   06/21/18 1006  BP: 107/74  Pulse: 96  Weight: 192 lb (87.1 kg)  Body mass index is 40.13 kg/m.           Physical Examination:   General appearance: alert, well appearing, and in no distress  Mental status: alert, oriented to person, place, and time  Skin: warm & dry   Extremities: Edema: Trace    Cardiovascular: normal heart rate noted  Respiratory: normal respiratory effort, no distress  Abdomen: gravid, soft, non-tender  Pelvic: Cervical exam deferred         Fetal Status:     Movement: Present    Fetal Surveillance Testing today: FHR 142   Results for orders placed or performed in visit on 06/21/18 (from the past 24 hour(s))  POC Urinalysis Dipstick OB   Collection Time: 06/21/18 10:07 AM  Result Value Ref Range   Color, UA     Clarity, UA     Glucose, UA Negative Negative   Bilirubin, UA     Ketones, UA neg    Spec Grav, UA     Blood, UA neg    pH, UA     POC,PROTEIN,UA Negative Negative, Trace, Small  (1+), Moderate (2+), Large (3+), 4+   Urobilinogen, UA     Nitrite, UA neg    Leukocytes, UA Negative Negative   Appearance     Odor      Assessment & Plan:  1) High-risk pregnancy G1P0000 at [redacted]w[redacted]d with an Estimated Date of Delivery: 08/08/18   2) Class A1 DM, stable, if pt doesn't get up in middle of night CBG are good if she does they are elevated    Meds: No orders of the defined types were placed in this encounter.   Labs/procedures today:   Treatment Plan:  EFW 36 weeks, IOL 40 wks or as clinically indicated  Reviewed: Preterm labor symptoms and general obstetric precautions including but not limited to vaginal bleeding, contractions, leaking of fluid and fetal movement were reviewed in detail with the patient.  All questions were answered.  Follow-up: No follow-ups on file.  Orders Placed This Encounter  Procedures  . POC Urinalysis Dipstick OB   Lazaro Arms  06/21/2018 10:31 AM

## 2018-07-01 ENCOUNTER — Encounter: Payer: Self-pay | Admitting: Women's Health

## 2018-07-01 ENCOUNTER — Ambulatory Visit (INDEPENDENT_AMBULATORY_CARE_PROVIDER_SITE_OTHER): Payer: Medicaid Other | Admitting: Women's Health

## 2018-07-01 ENCOUNTER — Telehealth: Payer: Self-pay | Admitting: Women's Health

## 2018-07-01 ENCOUNTER — Encounter: Payer: Self-pay | Admitting: *Deleted

## 2018-07-01 VITALS — BP 130/81 | HR 94 | Wt 197.0 lb

## 2018-07-01 DIAGNOSIS — Z1389 Encounter for screening for other disorder: Secondary | ICD-10-CM

## 2018-07-01 DIAGNOSIS — Z3A34 34 weeks gestation of pregnancy: Secondary | ICD-10-CM

## 2018-07-01 DIAGNOSIS — O0993 Supervision of high risk pregnancy, unspecified, third trimester: Secondary | ICD-10-CM

## 2018-07-01 DIAGNOSIS — Z331 Pregnant state, incidental: Secondary | ICD-10-CM

## 2018-07-01 DIAGNOSIS — O24410 Gestational diabetes mellitus in pregnancy, diet controlled: Secondary | ICD-10-CM

## 2018-07-01 LAB — POCT URINALYSIS DIPSTICK OB
Blood, UA: NEGATIVE
Glucose, UA: NEGATIVE
Ketones, UA: NEGATIVE
LEUKOCYTES UA: NEGATIVE
Nitrite, UA: NEGATIVE

## 2018-07-01 NOTE — Patient Instructions (Signed)
Martha Herrera, I greatly value your feedback.  If you receive a survey following your visit with [place] today, we appreciate you taking the time to fill it out.  Thanks, [name], CNM, WHNP-BC   Call the office [phone]) or go to [facility] if:  You begin to have strong, frequent contractions  Your water breaks.  Sometimes it is a big gush of fluid, sometimes it is just a trickle that keeps getting your panties wet or running down your legs  You have vaginal bleeding.  It is normal to have a small amount of spotting if your cervix was checked.   You don't feel your baby moving like normal.  If you don't, get you something to eat and drink and lay down and focus on feeling your baby move.  You should feel at least 10 movements in 2 hours.  If you don't, you should call the office or go to [facility].     Preterm Labor and Birth Information  The normal length of a pregnancy is 39-41 weeks. Preterm labor is when labor starts before 37 completed weeks of pregnancy. What are the risk factors for preterm labor? Preterm labor is more likely to occur in women who:  Have certain infections during pregnancy such as a bladder infection, sexually transmitted infection, or infection inside the uterus (chorioamnionitis).  Have a shorter-than-normal cervix.  Have gone into preterm labor before.  Have had surgery on their cervix.  Are younger than age 73 or older than age 46.  Are African American.  Are pregnant with twins or multiple babies (multiple gestation).  Take street drugs or smoke while pregnant.  Do not gain enough weight while pregnant.  Became pregnant shortly after having been pregnant. What are the symptoms of preterm labor? Symptoms of preterm labor include:  Cramps similar to those that can happen during a menstrual period. The cramps may happen with diarrhea.  Pain in the abdomen or lower back.  Regular uterine contractions that may feel like tightening of  the abdomen.  A feeling of increased pressure in the pelvis.  Increased watery or bloody mucus discharge from the vagina.  Water breaking (ruptured amniotic sac). Why is it important to recognize signs of preterm labor? It is important to recognize signs of preterm labor because babies who are born prematurely may not be fully developed. This can put them at an increased risk for:  Long-term (chronic) heart and lung problems.  Difficulty immediately after birth with regulating body systems, including blood sugar, body temperature, heart rate, and breathing rate.  Bleeding in the brain.  Cerebral palsy.  Learning difficulties.  Death. These risks are highest for babies who are born before 34 weeks of pregnancy. How is preterm labor treated? Treatment depends on the length of your pregnancy, your condition, and the health of your baby. It may involve:  Having a stitch (suture) placed in your cervix to prevent your cervix from opening too early (cerclage).  Taking or being given medicines, such as: ? Hormone medicines. These may be given early in pregnancy to help support the pregnancy. ? Medicine to stop contractions. ? Medicines to help mature the baby's lungs. These may be prescribed if the risk of delivery is high. ? Medicines to prevent your baby from developing cerebral palsy. If the labor happens before 34 weeks of pregnancy, you may need to stay in the hospital. What should I do if I think I am in preterm labor? If you think that  you are going into preterm labor, call your health care provider right away. How can I prevent preterm labor in future pregnancies? To increase your chance of having a full-term pregnancy:  Do not use any tobacco products, such as cigarettes, chewing tobacco, and e-cigarettes. If you need help quitting, ask your health care provider.  Do not use street drugs or medicines that have not been prescribed to you during your pregnancy.  Talk with  your health care provider before taking any herbal supplements, even if you have been taking them regularly.  Make sure you gain a healthy amount of weight during your pregnancy.  Watch for infection. If you think that you might have an infection, get it checked right away.  Make sure to tell your health care provider if you have gone into preterm labor before. This information is not intended to replace advice given to you by your health care provider. Make sure you discuss any questions you have with your health care provider. Document Released: 07/08/2003 Document Revised: 09/28/2015 Document Reviewed: 09/08/2015 Elsevier Interactive Patient Education  2019 [employer].

## 2018-07-01 NOTE — Telephone Encounter (Signed)
Patient states yesterday she had menstrual like cramps all day and pressure. She is still having cramping all along her lower abdomen despite taking Tylenol and resting.  Says she is drinking plenty of water and has not done anything abnormal this weekend like lifting.  Advised patient to come in this afternoon for eval and we will move her appt from Friday. Pt agreeable to plan.

## 2018-07-01 NOTE — Progress Notes (Signed)
   Work-in HIGH-RISK PREGNANCY VISIT Patient name: Martha Herrera MRN [id number]  Date of birth: Oct 15, 1995 Chief Complaint:   Routine Prenatal Visit  History of Present Illness:   Martha Herrera is a 23 y.o. G1P0000 female at [redacted]w[redacted]d with an Estimated Date of Delivery: 08/08/18 being seen today for ongoing management of a high-risk pregnancy complicated by A1DM.  Today she is being seen as a work-in for cramping since yesterday. No vb or lof. Denies abnormal discharge, itching/odor/irritation.  No uti s/s. Reports all fbs <95, only two 2hr pp > 120 (129, 145). Contractions: Irregular. Vag. Bleeding: None.  Movement: Present. denies leaking of fluid.  Review of Systems:   Pertinent items are noted in HPI Denies abnormal vaginal discharge w/ itching/odor/irritation, headaches, visual changes, shortness of breath, chest pain, abdominal pain, severe nausea/vomiting, or problems with urination or bowel movements unless otherwise stated above. Pertinent History Reviewed:  Reviewed past medical,surgical, social, obstetrical and family history.  Reviewed problem list, medications and allergies. Physical Assessment:   Vitals:   07/01/18 1549  BP: 130/81  Pulse: 94  Weight: 197 lb (89.4 kg)  Body mass index is 41.17 kg/m.           Physical Examination:   General appearance: alert, well appearing, and in no distress  Mental status: alert, oriented to person, place, and time  Skin: warm & dry   Extremities: Edema: Trace    Cardiovascular: normal heart rate noted  Respiratory: normal respiratory effort, no distress  Abdomen: gravid, soft, non-tender  Pelvic: spec exam: cx visually closed, normal d/c  Dilation: Closed Effacement (%): 50 Station: -3  Fetal Status: Fetal Heart Rate (bpm): 152 Fundal Height: 37 cm Movement: Present Presentation: Vertex  Fetal Surveillance Testing today: doppler   Results for orders placed or performed in visit on 07/01/18 (from the past 24 hour(s))  POC Urinalysis  Dipstick OB   Collection Time: 07/01/18  3:53 PM  Result Value Ref Range   Color, UA     Clarity, UA     Glucose, UA Negative Negative   Bilirubin, UA     Ketones, UA neg    Spec Grav, UA     Blood, UA neg    pH, UA     POC,PROTEIN,UA Trace Negative, Trace, Small (1+), Moderate (2+), Large (3+), 4+   Urobilinogen, UA     Nitrite, UA neg    Leukocytes, UA Negative Negative   Appearance     Odor      Assessment & Plan:  1) High-risk pregnancy G1P0000 at [redacted]w[redacted]d with an Estimated Date of Delivery: 08/08/18   2) A1DM, stable  3) Cramping, cx closed, increase water intake, reviewed ptl s/s, reasons to seek care  Meds: No orders of the defined types were placed in this encounter.   Labs/procedures today: spec exam, sve  Treatment Plan:  EFW @ 36wks, deliver @ 39-40wks  Reviewed: Preterm labor symptoms and general obstetric precautions including but not limited to vaginal bleeding, contractions, leaking of fluid and fetal movement were reviewed in detail with the patient.  All questions were answered.  Follow-up: Return in about 2 weeks (around 07/15/2018) for LROB, US:EFW.  Orders Placed This Encounter  Procedures  . US OB Follow Up  . POC Urinalysis Dipstick OB   Cheral Marker CNM, [facility] 07/01/2018 4:34 PM

## 2018-07-01 NOTE — Telephone Encounter (Signed)
35 weeks having some issues she would like to discuss

## 2018-07-05 ENCOUNTER — Encounter: Payer: Self-pay | Admitting: Women's Health

## 2018-07-10 ENCOUNTER — Ambulatory Visit: Payer: Self-pay | Admitting: Physician Assistant

## 2018-07-12 ENCOUNTER — Other Ambulatory Visit: Payer: Self-pay

## 2018-07-12 ENCOUNTER — Ambulatory Visit (INDEPENDENT_AMBULATORY_CARE_PROVIDER_SITE_OTHER): Payer: Medicaid Other | Admitting: Physician Assistant

## 2018-07-12 ENCOUNTER — Encounter: Payer: Self-pay | Admitting: Physician Assistant

## 2018-07-12 VITALS — BP 113/79 | HR 118 | Temp 98.0°F | Resp 16 | Ht <= 58 in | Wt 202.4 lb

## 2018-07-12 DIAGNOSIS — O24410 Gestational diabetes mellitus in pregnancy, diet controlled: Secondary | ICD-10-CM

## 2018-07-12 NOTE — Patient Instructions (Signed)
Diabetes Mellitus and Exercise Exercising regularly is important for your overall health, especially when you have diabetes (diabetes mellitus). Exercising is not only about losing weight. It has many other health benefits, such as increasing muscle strength and bone density and reducing body fat and stress. This leads to improved fitness, flexibility, and endurance, all of which result in better overall health. Exercise has additional benefits for people with diabetes, including:  Reducing appetite.  Helping to lower and control blood glucose.  Lowering blood pressure.  Helping to control amounts of fatty substances (lipids) in the blood, such as cholesterol and triglycerides.  Helping the body to respond better to insulin (improving insulin sensitivity).  Reducing how much insulin the body needs.  Decreasing the risk for heart disease by: ? Lowering cholesterol and triglyceride levels. ? Increasing the levels of good cholesterol. ? Lowering blood glucose levels. What is my activity plan? Your health care provider or certified diabetes educator can help you make a plan for the type and frequency of exercise (activity plan) that works for you. Make sure that you:  Do at least 150 minutes of moderate-intensity or vigorous-intensity exercise each week. This could be brisk walking, biking, or water aerobics. ? Do stretching and strength exercises, such as yoga or weightlifting, at least 2 times a week. ? Spread out your activity over at least 3 days of the week.  Get some form of physical activity every day. ? Do not go more than 2 days in a row without some kind of physical activity. ? Avoid being inactive for more than 30 minutes at a time. Take frequent breaks to walk or stretch.  Choose a type of exercise or activity that you enjoy, and set realistic goals.  Start slowly, and gradually increase the intensity of your exercise over time. What do I need to know about managing my  diabetes?   Check your blood glucose before and after exercising. ? If your blood glucose is 240 mg/dL (13.3 mmol/L) or higher before you exercise, check your urine for ketones. If you have ketones in your urine, do not exercise until your blood glucose returns to normal. ? If your blood glucose is 100 mg/dL (5.6 mmol/L) or lower, eat a snack containing 15-20 grams of carbohydrate. Check your blood glucose 15 minutes after the snack to make sure that your level is above 100 mg/dL (5.6 mmol/L) before you start your exercise.  Know the symptoms of low blood glucose (hypoglycemia) and how to treat it. Your risk for hypoglycemia increases during and after exercise. Common symptoms of hypoglycemia can include: ? Hunger. ? Anxiety. ? Sweating and feeling clammy. ? Confusion. ? Dizziness or feeling light-headed. ? Increased heart rate or palpitations. ? Blurry vision. ? Tingling or numbness around the mouth, lips, or tongue. ? Tremors or shakes. ? Irritability.  Keep a rapid-acting carbohydrate snack available before, during, and after exercise to help prevent or treat hypoglycemia.  Avoid injecting insulin into areas of the body that are going to be exercised. For example, avoid injecting insulin into: ? The arms, when playing tennis. ? The legs, when jogging.  Keep records of your exercise habits. Doing this can help you and your health care provider adjust your diabetes management plan as needed. Write down: ? Food that you eat before and after you exercise. ? Blood glucose levels before and after you exercise. ? The type and amount of exercise you have done. ? When your insulin is expected to peak, if you use   insulin. Avoid exercising at times when your insulin is peaking.  When you start a new exercise or activity, work with your health care provider to make sure the activity is safe for you, and to adjust your insulin, medicines, or food intake as needed.  Drink plenty of water while  you exercise to prevent dehydration or heat stroke. Drink enough fluid to keep your urine clear or pale yellow. Summary  Exercising regularly is important for your overall health, especially when you have diabetes (diabetes mellitus).  Exercising has many health benefits, such as increasing muscle strength and bone density and reducing body fat and stress.  Your health care provider or certified diabetes educator can help you make a plan for the type and frequency of exercise (activity plan) that works for you.  When you start a new exercise or activity, work with your health care provider to make sure the activity is safe for you, and to adjust your insulin, medicines, or food intake as needed. This information is not intended to replace advice given to you by your health care provider. Make sure you discuss any questions you have with your health care provider. Document Released: 07/08/2003 Document Revised: 10/26/2016 Document Reviewed: 09/27/2015 Elsevier Interactive Patient Education  2019 [employer] Inc[employer]  

## 2018-07-12 NOTE — Progress Notes (Signed)
Patient: Martha Herrera, Female    DOB: 17-Oct-1995, 23 y.o.   MRN: [id number] Visit Date: 07/14/2018  Today's Provider: Trinna Post, PA-C   Chief Complaint  Patient presents with  . Establish Care   Subjective:     Annual physical exam Martha Herrera is a 23 y.o. female who presents today to establish care.  Currently pregnant with first child, [redacted] weeks along. Has gestational diabetes. Living with husband, mother in law, and two brothers in law. 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  Used to be seen in [place]   Presenting today to establish care.   PAP 11/09/2016: normal.  -----------------------------------------------------------------   Review of Systems  Constitutional: Positive for fatigue.  Psychiatric/Behavioral: Positive for sleep disturbance.  All other systems reviewed and are negative.   Social History      She  reports that she has never smoked. She has never used smokeless tobacco. She reports that she does not drink alcohol or use drugs.       Social History   Socioeconomic History  . Marital status: Married    Spouse name: Shylyn Younce  . Number of children: Not on file  . Years of education: Not on file  . Highest education level: Associate degree: academic program  Occupational History  . Not on file  Social Needs  . Financial resource strain: Not very hard  . Food insecurity:    Worry: Never true    Inability: Never true  . Transportation needs:    Medical: No    Non-medical: No  Tobacco Use  . Smoking status: Never Smoker  . Smokeless tobacco: Never Used  Substance and Sexual Activity  . Alcohol use: No  . Drug use: No  . Sexual activity: Yes    Birth control/protection: None  Lifestyle  . Physical activity:    Days per week: 7 days    Minutes per session: 30 min  . Stress: Not at all  Relationships  . Social connections:    Talks on phone: More than three times a week    Gets together: More than three times a week    Attends  religious service: 1 to 4 times per year    Active member of club or organization: No    Attends meetings of clubs or organizations: Never    Relationship status: Married  Other Topics Concern  . Not on file  Social History Narrative   10th grade    Past Medical History:  Diagnosis Date  . Gastroesophageal reflux    takes prescribed meds daily  . Headache(784.0)    not as often  . Heart murmur   . Pneumonia 2013  . Varicella      Patient Active Problem List   Diagnosis Date Noted  . Abnormal glucose tolerance test (GTT) during pregnancy, antepartum 05/23/2018  . Gestational diabetes mellitus, class A1 05/13/2018  . Nausea vomiting and diarrhea 04/29/2018  . Rubella non-immune status, antepartum 01/21/2018  . Supervision of high risk pregnancy, antepartum 01/18/2018  . Tension headache 01/13/2013  . Migraine without aura 01/13/2013  . Cardiac murmur 07/25/2012  . 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 Right sided abdominal pain 03/12/2012  . Substernal chest pain 03/12/2012  . Gastroesophageal reflux     Past Surgical History:  Procedure Laterality Date  . ESOPHAGOGASTRODUODENOSCOPY  05/03/2012   Procedure: ESOPHAGOGASTRODUODENOSCOPY (EGD);  Surgeon: Oletha Blend, MD;  Location: [place];  Service: Gastroenterology;  Laterality: N/A;  .  tubes in ears    . WISDOM TOOTH EXTRACTION      Family History        Family Status  Relation Name Status  . Father  Alive  . Brother 47 yo Comptroller  . MGM  Alive  . Brother 7 yo Comptroller  . Mother  Alive  . MGF  Deceased at age 93       Lung Cancer  . Sister 65 Alive  . PGM  Alive  . PGF  Alive  . Mat [employer]  . [employer]  . Mat [employer]  . Pat Uncle x3 Alive  . Neg Hx  (Not Specified)        Her family history includes ADD / ADHD in her brother; Anxiety disorder in her maternal aunt; Asthma in her brother; Cancer in her father, maternal grandfather, and paternal uncle; Diabetes in her maternal grandfather and  paternal aunt; GER disease in her father, maternal aunt, and maternal grandmother; Headache in her mother; Hyperlipidemia in her maternal grandfather and maternal grandmother; Migraines in her maternal aunt; Miscarriages / Stillbirths in her brother and mother; Vision loss in her maternal grandfather. There is no history of Ataxia, Chorea, Dementia, Mental retardation, Multiple sclerosis, Neurofibromatosis, Neuropathy, Parkinsonism, Seizures, Stroke, Depression, or Bipolar disorder.      Allergies  Allergen Reactions  . Penicillins Hives, Itching and Rash  . Amoxil [Amoxicillin]      Current Outpatient Medications:  .  ACCU-CHEK FASTCLIX LANCETS MISC, 1 each by Percutaneous route 4 (four) times daily., Disp: 100 each, Rfl: 12 .  acetaminophen (TYLENOL) 500 MG tablet, Take 1,000 mg by mouth every 6 (six) hours as needed., Disp: , Rfl:  .  Blood Glucose Monitoring Suppl (ACCU-CHEK GUIDE) w/Device KIT, 1 each by Does not apply route 4 (four) times daily., Disp: 1 kit, Rfl: 0 .  glucose blood (ACCU-CHEK GUIDE) test strip, Use as instructed to check blood sugar 4 times daily, Disp: 100 each, Rfl: 12 .  omeprazole (PRILOSEC) 40 MG capsule, Take 1 capsule (40 mg total) by mouth daily., Disp: 30 capsule, Rfl: 6 .  Prenatal MV-Min-FA-Omega-3 (PRENATAL GUMMIES/DHA & FA PO), Take by mouth., Disp: , Rfl:    Patient Care Team: Paulene Floor as PCP - General (Physician Assistant)    Objective:    Vitals: BP 113/79 (BP Location: Left Arm, Patient Position: Sitting, Cuff Size: Large)   Pulse (!) 118   Temp 98 F (36.7 C) (Oral)   Resp 16   Ht 4' 10"  (1.473 m)   Wt 202 lb 6.4 oz (91.8 kg)   BMI 42.30 kg/m    Vitals:   07/12/18 1503  BP: 113/79  Pulse: (!) 118  Resp: 16  Temp: 98 F (36.7 C)  TempSrc: Oral  Weight: 202 lb 6.4 oz (91.8 kg)  Height: 4' 10"  (1.473 m)     Physical Exam Constitutional:      Appearance: Normal appearance. She is normal weight.  Cardiovascular:      Rate and Rhythm: Normal rate and regular rhythm.     Pulses: Normal pulses.     Heart sounds: Normal heart sounds.  Pulmonary:     Effort: Pulmonary effort is normal.     Breath sounds: Normal breath sounds.  Skin:    General: Skin is warm and dry.  Neurological:     Mental Status: She is oriented to person, place, and time. Mental status is at baseline.  Psychiatric:        Mood and Affect: Mood normal.        Behavior: Behavior normal.      Depression Screen PHQ 2/9 Scores 07/12/2018 05/23/2018 01/18/2018 11/08/2016  PHQ - 2 Score 1 0 0 1  PHQ- 9 Score - - 4 -       Assessment & Plan:     Routine Health Maintenance and Physical Exam  Exercise Activities and Dietary recommendations Goals   None     Immunization History  Administered Date(s) Administered  . Influenza,inj,Quad PF,6+ Mos 01/18/2018  . Tdap 05/10/2018    Health Maintenance  Topic Date Due  . CHLAMYDIA SCREENING  08/31/2010  . PAP-Cervical Cytology Screening  11/09/2019  . PAP SMEAR-Modifier  11/09/2019  . TETANUS/TDAP  05/10/2028  . INFLUENZA VACCINE  Completed  . HIV Screening  Completed     Discussed health benefits of physical activity, and encouraged her to engage in regular exercise appropriate for her age and condition.    1. Gestational diabetes mellitus, class A1  Discussed gestational diabetes putting her at higher risk for diabetes after delivery. Discussed dietary modifications and exercise modifications.  The entirety of the information documented in the History of Present Illness, Review of Systems and Physical Exam were personally obtained by me. Portions of this information were initially documented by Lyndel Pleasure, CMA and reviewed by me for thoroughness and accuracy.   Return if symptoms worsen or fail to improve.     --------------------------------------------------------------------    Trinna Post, PA-C  [place]

## 2018-07-15 ENCOUNTER — Ambulatory Visit (INDEPENDENT_AMBULATORY_CARE_PROVIDER_SITE_OTHER): Payer: Medicaid Other

## 2018-07-15 ENCOUNTER — Ambulatory Visit (INDEPENDENT_AMBULATORY_CARE_PROVIDER_SITE_OTHER): Payer: Medicaid Other | Admitting: Family Medicine

## 2018-07-15 ENCOUNTER — Encounter: Payer: Self-pay | Admitting: Family Medicine

## 2018-07-15 ENCOUNTER — Other Ambulatory Visit: Payer: Self-pay

## 2018-07-15 VITALS — BP 136/84 | HR 72 | Wt 200.4 lb

## 2018-07-15 DIAGNOSIS — R197 Diarrhea, unspecified: Secondary | ICD-10-CM

## 2018-07-15 DIAGNOSIS — Z3A36 36 weeks gestation of pregnancy: Secondary | ICD-10-CM

## 2018-07-15 DIAGNOSIS — O0993 Supervision of high risk pregnancy, unspecified, third trimester: Secondary | ICD-10-CM

## 2018-07-15 DIAGNOSIS — Z283 Underimmunization status: Secondary | ICD-10-CM

## 2018-07-15 DIAGNOSIS — R112 Nausea with vomiting, unspecified: Secondary | ICD-10-CM

## 2018-07-15 DIAGNOSIS — O24410 Gestational diabetes mellitus in pregnancy, diet controlled: Secondary | ICD-10-CM

## 2018-07-15 DIAGNOSIS — Z1389 Encounter for screening for other disorder: Secondary | ICD-10-CM

## 2018-07-15 DIAGNOSIS — O0990 Supervision of high risk pregnancy, unspecified, unspecified trimester: Secondary | ICD-10-CM

## 2018-07-15 DIAGNOSIS — Z362 Encounter for other antenatal screening follow-up: Secondary | ICD-10-CM

## 2018-07-15 DIAGNOSIS — Z331 Pregnant state, incidental: Secondary | ICD-10-CM

## 2018-07-15 DIAGNOSIS — O09899 Supervision of other high risk pregnancies, unspecified trimester: Secondary | ICD-10-CM

## 2018-07-15 DIAGNOSIS — O9989 Other specified diseases and conditions complicating pregnancy, childbirth and the puerperium: Secondary | ICD-10-CM

## 2018-07-15 LAB — POCT URINALYSIS DIPSTICK OB
Blood, UA: NEGATIVE
Glucose, UA: NEGATIVE
Ketones, UA: NEGATIVE
Leukocytes, UA: NEGATIVE
NITRITE UA: NEGATIVE
POC,PROTEIN,UA: NEGATIVE

## 2018-07-15 NOTE — Progress Notes (Signed)
[place] 36+4 wks,cephalic,posterior placenta gr 3,afi 16 cm,fhr 123 bpm,bilat adnexa's wnl,EFW 3724 g 98%

## 2018-07-15 NOTE — Progress Notes (Signed)
   HIGH-RISK PREGNANCY VISIT Patient name: Martha Herrera MRN [id number]  Date of birth: 25-Feb-1996 Chief Complaint:   Routine Prenatal Visit ([place] today)  History of Present Illness:   Martha Herrera is a 23 y.o. G1P0000 female at [redacted]w[redacted]d with an Estimated Date of Delivery: 08/08/18 being seen today for ongoing management of a high-risk pregnancy complicated by class  A1 DM.  Today she reports no complaints. Contractions: Irritability. Vag. Bleeding: None.  Movement: Present. denies leaking of fluid.  Review of Systems:   Pertinent items are noted in HPI Denies abnormal vaginal discharge w/ itching/odor/irritation, headaches, visual changes, shortness of breath, chest pain, abdominal pain, severe nausea/vomiting, or problems with urination or bowel movements unless otherwise stated above. Pertinent History Reviewed:  Reviewed past medical,surgical, social, obstetrical and family history.  Reviewed problem list, medications and allergies. Physical Assessment:   Vitals:   07/15/18 1020  BP: 136/84  Pulse: 72  Weight: 200 lb 6.4 oz (90.9 kg)  Body mass index is 41.88 kg/m.           Physical Examination:   General appearance: alert, well appearing, and in no distress  Mental status: alert, oriented to person, place, and time  Skin: warm & dry   Extremities: Edema: Trace    Cardiovascular: normal heart rate noted  Respiratory: normal respiratory effort, no distress  Abdomen: gravid, soft, non-tender  Pelvic: Cervical exam deferred         Fetal Status: Fetal Heart Rate (bpm): 123us   Movement: Present Presentation: Vertex  Fetal Surveillance Testing today: NA   Results for orders placed or performed in visit on 07/15/18 (from the past 24 hour(s))  POC Urinalysis Dipstick OB   Collection Time: 07/15/18 10:25 AM  Result Value Ref Range   Color, UA     Clarity, UA     Glucose, UA Negative Negative   Bilirubin, UA     Ketones, UA neg    Spec Grav, UA     Blood, UA neg    pH, UA     POC,PROTEIN,UA Negative Negative, Trace, Small (1+), Moderate (2+), Large (3+), 4+   Urobilinogen, UA     Nitrite, UA neg    Leukocytes, UA Negative Negative   Appearance     Odor      Assessment & Plan:  1) High-risk pregnancy G1P0000 at [redacted]w[redacted]d with an Estimated Date of Delivery: 08/08/18   2) A1GDM, stable. Fastings all < 95, only 3 post prandials above 120. Has babyscripts app.    Meds: No orders of the defined types were placed in this encounter.   Labs/procedures today: Growth [place] today that showed  EFW= 3724  grams, 98 % with AC 99th%  Treatment Plan:  A1GDM Encouraged glycemic control. Consider IOL at 40wk g  Reviewed: Preterm labor symptoms and general obstetric precautions including but not limited to vaginal bleeding, contractions, leaking of fluid and fetal movement were reviewed in detail with the patient.  All questions were answered.  Follow-up: Return in about 1 week (around 07/22/2018) for Routine prenatal care.  Orders Placed This Encounter  Procedures  . GC/Chlamydia Probe Amp  . Culture, beta strep (group b only)  . POC Urinalysis Dipstick OB   [address] Alvester Morin CNM, [facility] 07/15/2018 11:02 AM

## 2018-07-17 LAB — GC/CHLAMYDIA PROBE AMP
Chlamydia trachomatis, NAA: NEGATIVE
Neisseria gonorrhoeae by PCR: NEGATIVE

## 2018-07-19 LAB — CULTURE, BETA STREP (GROUP B ONLY): Strep Gp B Culture: NEGATIVE

## 2018-07-22 ENCOUNTER — Other Ambulatory Visit: Payer: Self-pay

## 2018-07-22 ENCOUNTER — Ambulatory Visit (INDEPENDENT_AMBULATORY_CARE_PROVIDER_SITE_OTHER): Payer: Medicaid Other | Admitting: Women's Health

## 2018-07-22 ENCOUNTER — Encounter: Payer: Self-pay | Admitting: Women's Health

## 2018-07-22 VITALS — BP 131/83 | HR 103 | Wt 200.5 lb

## 2018-07-22 DIAGNOSIS — O0990 Supervision of high risk pregnancy, unspecified, unspecified trimester: Secondary | ICD-10-CM

## 2018-07-22 DIAGNOSIS — O24410 Gestational diabetes mellitus in pregnancy, diet controlled: Secondary | ICD-10-CM

## 2018-07-22 DIAGNOSIS — O0993 Supervision of high risk pregnancy, unspecified, third trimester: Secondary | ICD-10-CM

## 2018-07-22 DIAGNOSIS — Z331 Pregnant state, incidental: Secondary | ICD-10-CM

## 2018-07-22 DIAGNOSIS — Z1389 Encounter for screening for other disorder: Secondary | ICD-10-CM

## 2018-07-22 DIAGNOSIS — Z3A37 37 weeks gestation of pregnancy: Secondary | ICD-10-CM

## 2018-07-22 LAB — POCT URINALYSIS DIPSTICK OB
Blood, UA: NEGATIVE
Glucose, UA: NEGATIVE
Ketones, UA: NEGATIVE
Nitrite, UA: NEGATIVE
POC,PROTEIN,UA: NEGATIVE

## 2018-07-22 NOTE — Progress Notes (Signed)
   HIGH-RISK PREGNANCY VISIT Patient name: Martha Herrera MRN [id number]  Date of birth: February 19, 1996 Chief Complaint:   Routine Prenatal Visit  History of Present Illness:   Martha Herrera is a 23 y.o. G1P0000 female at [redacted]w[redacted]d with an Estimated Date of Delivery: 08/08/18 being seen today for ongoing management of a high-risk pregnancy complicated by A1DM, suspected LGA.  Today she reports no complaints. Contractions: Not present. Vag. Bleeding: None.  Movement: Present. denies leaking of fluid.  Review of Systems:   Pertinent items are noted in HPI Denies abnormal vaginal discharge w/ itching/odor/irritation, headaches, visual changes, shortness of breath, chest pain, abdominal pain, severe nausea/vomiting, or problems with urination or bowel movements unless otherwise stated above. Pertinent History Reviewed:  Reviewed past medical,surgical, social, obstetrical and family history.  Reviewed problem list, medications and allergies. Physical Assessment:   Vitals:   07/22/18 1024  BP: 131/83  Pulse: (!) 103  Weight: 200 lb 8 oz (90.9 kg)  Body mass index is 41.9 kg/m.           Physical Examination:   General appearance: alert, well appearing, and in no distress  Mental status: alert, oriented to person, place, and time  Skin: warm & dry   Extremities: Edema: Trace    Cardiovascular: normal heart rate noted  Respiratory: normal respiratory effort, no distress  Abdomen: gravid, soft, non-tender  Pelvic: Cervical exam performed  Dilation: 1.5 Effacement (%): 80 Station: -2  Fetal Status: Fetal Heart Rate (bpm): 140 Fundal Height: 40 cm Movement: Present Presentation: Vertex  Fetal Surveillance Testing today: doppler   Results for orders placed or performed in visit on 07/22/18 (from the past 24 hour(s))  POC Urinalysis Dipstick OB   Collection Time: 07/22/18 10:27 AM  Result Value Ref Range   Color, UA     Clarity, UA     Glucose, UA Negative Negative   Bilirubin, UA     Ketones, UA  neg    Spec Grav, UA     Blood, UA neg    pH, UA     POC,PROTEIN,UA Negative Negative, Trace, Small (1+), Moderate (2+), Large (3+), 4+   Urobilinogen, UA     Nitrite, UA neg    Leukocytes, UA Small (1+) (A) Negative   Appearance     Odor      Assessment & Plan:  1) High-risk pregnancy G1P0000 at [redacted]w[redacted]d with an Estimated Date of Delivery: 08/08/18   2) A1DM, stable  3) Suspected LGA, EFW 98%/3724g @ 36.4wks, extrapolates to 4550g @ 40wks/confirmed w/ LHE. To make appt w/ MD next week to discuss mode of delivery.   Meds: No orders of the defined types were placed in this encounter.   Labs/procedures today: sve  Treatment Plan:  IOL @ 40wks  Reviewed: Term labor symptoms and general obstetric precautions including but not limited to vaginal bleeding, contractions, leaking of fluid and fetal movement were reviewed in detail with the patient.  All questions were answered.  Follow-up: Return in about 1 week (around 07/29/2018) for LROB.  Orders Placed This Encounter  Procedures  . POC Urinalysis Dipstick OB   Cheral Marker CNM, [facility] 07/22/2018 10:52 AM

## 2018-07-22 NOTE — Patient Instructions (Signed)
Martha Herrera, I greatly value your feedback.  If you receive a survey following your visit with [place] today, we appreciate you taking the time to fill it out.  Thanks, [name], CNM, [facility]  The [facility] HAS MOVED!!! It is now [facility] at [facility] ([address] [place], [place] [address]) Entrance located off of [address] Free 24/7 valet parking    Call the office [phone]) or go to [facility] if:  You begin to have strong, frequent contractions  Your water breaks.  Sometimes it is a big gush of fluid, sometimes it is just a trickle that keeps getting your panties wet or running down your legs  You have vaginal bleeding.  It is normal to have a small amount of spotting if your cervix was checked.   You don't feel your baby moving like normal.  If you don't, get you something to eat and drink and lay down and focus on feeling your baby move.  You should feel at least 10 movements in 2 hours.  If you don't, you should call the office or go to [facility].     [facility] Contractions Contractions of the uterus can occur throughout pregnancy, but they are not always a sign that you are in labor. You may have practice contractions called Braxton Hicks contractions. These false labor contractions are sometimes confused with true labor. What are Deberah Pelton contractions? Braxton Hicks contractions are tightening movements that occur in the muscles of the uterus before labor. Unlike true labor contractions, these contractions do not result in opening (dilation) and thinning of the cervix. Toward the end of pregnancy (32-34 weeks), Braxton Hicks contractions can happen more often and may become stronger. These contractions are sometimes difficult to tell apart from true labor because they can be very uncomfortable. You should not feel embarrassed if you go to the hospital with false labor. Sometimes, the only way to tell if you are in true labor is for  your health care provider to look for changes in the cervix. The health care provider will do a physical exam and may monitor your contractions. If you are not in true labor, the exam should show that your cervix is not dilating and your water has not broken. If there are no other health problems associated with your pregnancy, it is completely safe for you to be sent home with false labor. You may continue to have Braxton Hicks contractions until you go into true labor. How to tell the difference between true labor and false labor True labor  Contractions last 30-70 seconds.  Contractions become very regular.  Discomfort is usually felt in the top of the uterus, and it spreads to the lower abdomen and low back.  Contractions do not go away with walking.  Contractions usually become more intense and increase in frequency.  The cervix dilates and gets thinner. False labor  Contractions are usually shorter and not as strong as true labor contractions.  Contractions are usually irregular.  Contractions are often felt in the front of the lower abdomen and in the groin.  Contractions may go away when you walk around or change positions while lying down.  Contractions get weaker and are shorter-lasting as time goes on.  The cervix usually does not dilate or become thin. Follow these instructions at home:   Take over-the-counter and prescription medicines only as told by your health care provider.  Keep up with your usual exercises and  follow other instructions from your health care provider.  Eat and drink lightly if you think you are going into labor.  If Braxton Hicks contractions are making you uncomfortable: ? Change your position from lying down or resting to walking, or change from walking to resting. ? Sit and rest in a tub of warm water. ? Drink enough fluid to keep your urine pale yellow. Dehydration may cause these contractions. ? Do slow and deep breathing several times  an hour.  Keep all follow-up prenatal visits as told by your health care provider. This is important. Contact a health care provider if:  You have a fever.  You have continuous pain in your abdomen. Get help right away if:  Your contractions become stronger, more regular, and closer together.  You have fluid leaking or gushing from your vagina.  You pass blood-tinged mucus (bloody show).  You have bleeding from your vagina.  You have low back pain that you never had before.  You feel your baby's head pushing down and causing pelvic pressure.  Your baby is not moving inside you as much as it used to. Summary  Contractions that occur before labor are called Braxton Hicks contractions, false labor, or practice contractions.  Braxton Hicks contractions are usually shorter, weaker, farther apart, and less regular than true labor contractions. True labor contractions usually become progressively stronger and regular, and they become more frequent.  Manage discomfort from [facility] contractions by changing position, resting in a warm bath, drinking plenty of water, or practicing deep breathing. This information is not intended to replace advice given to you by your health care provider. Make sure you discuss any questions you have with your health care provider. Document Released: 08/31/2016 Document Revised: 01/30/2017 Document Reviewed: 08/31/2016 Elsevier Interactive Patient Education  2019 [place] (COVID-19) Are you at risk?  Are you at risk for the Coronavirus (COVID-19)?  To be considered HIGH RISK for Coronavirus (COVID-19), you have to meet the following criteria:  . Traveled to [place], [place], [place], [place] or [place]; or in the [place] to [place], [place], [place], or [place]; and have fever, cough, and shortness of breath within the last 2 weeks of travel OR . Been in close contact with a person diagnosed with COVID-19 within the  last 2 weeks and have fever, cough, and shortness of breath . IF YOU DO NOT MEET THESE CRITERIA, YOU ARE CONSIDERED LOW RISK FOR COVID-19.  What to do if you are HIGH RISK for COVID-19?  Marland Kitchen If you are having a medical emergency, call 911. . Seek medical care right away. Before you go to a doctor's office, urgent care or emergency department, call ahead and tell them about your recent travel, contact with someone diagnosed with COVID-19, and your symptoms. You should receive instructions from your physician's office regarding next steps of care.  . When you arrive at healthcare provider, tell the healthcare staff immediately you have returned from visiting [place], [place], [place], [place] or [place]; or traveled in the [place] to [place], [place], [place], or [place]; in the last two weeks or you have been in close contact with a person diagnosed with COVID-19 in the last 2 weeks.   . Tell the health care staff about your symptoms: fever, cough and shortness of breath. . After you have been seen by a medical provider, you will be either: o Tested for (COVID-19) and discharged home on quarantine except to  seek medical care if symptoms worsen, and asked to  - Stay home and avoid contact with others until you get your results (4-5 days)  - Avoid travel on public transportation if possible (such as bus, train, or airplane) or o Sent to the Emergency Department by EMS for evaluation, COVID-19 testing, and possible admission depending on your condition and test results.  What to do if you are LOW RISK for COVID-19?  Reduce your risk of any infection by using the same precautions used for avoiding the common cold or flu:  Marland Kitchen Wash your hands often with soap and warm water for at least 20 seconds.  If soap and water are not readily available, use an alcohol-based hand sanitizer with at least 60% alcohol.  . If coughing or sneezing, cover your mouth and nose by coughing or sneezing into the elbow  areas of your shirt or coat, into a tissue or into your sleeve (not your hands). . Avoid shaking hands with others and consider head nods or verbal greetings only. . Avoid touching your eyes, nose, or mouth with unwashed hands.  . Avoid close contact with people who are sick. . Avoid places or events with large numbers of people in one location, like concerts or sporting events. . Carefully consider travel plans you have or are making. . If you are planning any travel outside or inside the [place], visit the CDC's Travelers' Health webpage for the latest health notices. . If you have some symptoms but not all symptoms, continue to monitor at home and seek medical attention if your symptoms worsen. . If you are having a medical emergency, call 911.   ADDITIONAL HEALTHCARE OPTIONS FOR PATIENTS  [facility] / e-Visit: [email or web address]         [facility]: [phone]  [facility]: [phone]                   [facility]: [phone]

## 2018-07-26 ENCOUNTER — Telehealth: Payer: Self-pay | Admitting: Obstetrics & Gynecology

## 2018-07-26 NOTE — Telephone Encounter (Signed)
Pt aware of Restrictions and screening done/ has a cough not a new cough/

## 2018-07-28 ENCOUNTER — Telehealth: Payer: Self-pay | Admitting: Obstetrics and Gynecology

## 2018-07-28 ENCOUNTER — Encounter (HOSPITAL_COMMUNITY): Payer: Self-pay

## 2018-07-28 ENCOUNTER — Other Ambulatory Visit: Payer: Self-pay

## 2018-07-28 ENCOUNTER — Inpatient Hospital Stay (HOSPITAL_COMMUNITY)
Admission: AD | Admit: 2018-07-28 | Discharge: 2018-07-28 | Disposition: A | Discharge status: Home / Self Care | Payer: Medicaid Other | Source: Ambulatory Visit | Attending: Obstetrics and Gynecology | Admitting: Obstetrics and Gynecology

## 2018-07-28 DIAGNOSIS — K219 Gastro-esophageal reflux disease without esophagitis: Secondary | ICD-10-CM | POA: Insufficient documentation

## 2018-07-28 DIAGNOSIS — Z8051 Family history of malignant neoplasm of kidney: Secondary | ICD-10-CM | POA: Insufficient documentation

## 2018-07-28 DIAGNOSIS — R109 Unspecified abdominal pain: Secondary | ICD-10-CM | POA: Diagnosis not present

## 2018-07-28 DIAGNOSIS — Z3A38 38 weeks gestation of pregnancy: Secondary | ICD-10-CM | POA: Diagnosis not present

## 2018-07-28 DIAGNOSIS — O9989 Other specified diseases and conditions complicating pregnancy, childbirth and the puerperium: Secondary | ICD-10-CM | POA: Insufficient documentation

## 2018-07-28 DIAGNOSIS — Z880 Allergy status to penicillin: Secondary | ICD-10-CM | POA: Insufficient documentation

## 2018-07-28 DIAGNOSIS — Z808 Family history of malignant neoplasm of other organs or systems: Secondary | ICD-10-CM | POA: Insufficient documentation

## 2018-07-28 DIAGNOSIS — O169 Unspecified maternal hypertension, unspecified trimester: Secondary | ICD-10-CM

## 2018-07-28 DIAGNOSIS — Z833 Family history of diabetes mellitus: Secondary | ICD-10-CM | POA: Insufficient documentation

## 2018-07-28 DIAGNOSIS — O163 Unspecified maternal hypertension, third trimester: Secondary | ICD-10-CM

## 2018-07-28 DIAGNOSIS — Z8379 Family history of other diseases of the digestive system: Secondary | ICD-10-CM | POA: Insufficient documentation

## 2018-07-28 DIAGNOSIS — Z809 Family history of malignant neoplasm, unspecified: Secondary | ICD-10-CM | POA: Diagnosis not present

## 2018-07-28 DIAGNOSIS — O26893 Other specified pregnancy related conditions, third trimester: Secondary | ICD-10-CM

## 2018-07-28 DIAGNOSIS — R030 Elevated blood-pressure reading, without diagnosis of hypertension: Secondary | ICD-10-CM | POA: Diagnosis not present

## 2018-07-28 DIAGNOSIS — O99613 Diseases of the digestive system complicating pregnancy, third trimester: Secondary | ICD-10-CM | POA: Diagnosis not present

## 2018-07-28 DIAGNOSIS — O26899 Other specified pregnancy related conditions, unspecified trimester: Secondary | ICD-10-CM

## 2018-07-28 LAB — COMPREHENSIVE METABOLIC PANEL
ALT: 24 U/L (ref 0–44)
AST: 21 U/L (ref 15–41)
Albumin: 2.5 g/dL — ABNORMAL LOW (ref 3.5–5.0)
Alkaline Phosphatase: 257 U/L — ABNORMAL HIGH (ref 38–126)
Anion gap: 10 (ref 5–15)
BUN: 11 mg/dL (ref 6–20)
CALCIUM: 8.9 mg/dL (ref 8.9–10.3)
CO2: 20 mmol/L — ABNORMAL LOW (ref 22–32)
CREATININE: 0.61 mg/dL (ref 0.44–1.00)
Chloride: 104 mmol/L (ref 98–111)
GFR calc Af Amer: >60 mL/min (ref >60)
GFR calc non Af Amer: >60 mL/min (ref >60)
Glucose, Bld: 94 mg/dL (ref 70–99)
Potassium: 3.5 mmol/L (ref 3.5–5.1)
Sodium: 134 mmol/L — ABNORMAL LOW (ref 135–145)
Total Bilirubin: 0.4 mg/dL (ref 0.3–1.2)
Total Protein: 6.2 g/dL — ABNORMAL LOW (ref 6.5–8.1)

## 2018-07-28 LAB — URINALYSIS, ROUTINE W REFLEX MICROSCOPIC
Bilirubin Urine: NEGATIVE
Glucose, UA: NEGATIVE mg/dL
Hgb urine dipstick: NEGATIVE
KETONES UR: NEGATIVE mg/dL
Leukocytes,Ua: NEGATIVE
Nitrite: NEGATIVE
Protein, ur: NEGATIVE mg/dL
Specific Gravity, Urine: 1.021 (ref 1.005–1.030)
pH: 5 (ref 5.0–8.0)

## 2018-07-28 LAB — CBC
HCT: 31.8 % — ABNORMAL LOW (ref 36.0–46.0)
Hemoglobin: 9.7 g/dL — ABNORMAL LOW (ref 12.0–15.0)
MCH: 23 pg — ABNORMAL LOW (ref 26.0–34.0)
MCHC: 30.5 g/dL (ref 30.0–36.0)
MCV: 75.4 fL — ABNORMAL LOW (ref 80.0–100.0)
Platelets: 320 10*3/uL (ref 150–400)
RBC: 4.22 MIL/uL (ref 3.87–5.11)
RDW: 16.1 % — ABNORMAL HIGH (ref 11.5–15.5)
WBC: 12.5 10*3/uL — ABNORMAL HIGH (ref 4.0–10.5)
nRBC: 0 % (ref 0.0–0.2)

## 2018-07-28 LAB — PROTEIN / CREATININE RATIO, URINE
Creatinine, Urine: 128.51 mg/dL
Protein Creatinine Ratio: 0.1 mg/mg{Cre} (ref 0.00–0.15)
TOTAL PROTEIN, URINE: 13 mg/dL

## 2018-07-28 NOTE — MAU Note (Signed)
Pt c/o sharp bilateral abdominal pain that radiates to center as well as "menstrual " type cramps. Denies LOF or bleeding. Lost mucous plug.+FM

## 2018-07-28 NOTE — MAU Provider Note (Signed)
History     CSN: [id number]  Arrival date and time: 07/28/18 2040   First Provider Initiated Contact with Patient 07/28/18 2231      Chief Complaint  Patient presents with  . Abdominal Pain  . Abdominal Cramping   HPI Martha Herrera is a 23 y.o. G1P0000 at 53w3dwho presents with abdominal pain. She states it is upper abdominal pain and is constant. She rates the pain an 8/10 and has not tried anything for the pain. She also reports cramping but unsure how often. Denies headache, visual changes or epigastric pain. She denies any leaking or bleeding. Reports normal fetal movement.   OB History    Gravida  1   Para  0   Term  0   Preterm  0   AB  0   Living  0     SAB  0   TAB  0   Ectopic  0   Multiple  0   Live Births  0           Past Medical History:  Diagnosis Date  . Gastroesophageal reflux    takes prescribed meds daily  . Headache(784.0)    not as often  . Heart murmur   . Pneumonia 2013  . Varicella     Past Surgical History:  Procedure Laterality Date  . ESOPHAGOGASTRODUODENOSCOPY  05/03/2012   Procedure: ESOPHAGOGASTRODUODENOSCOPY (EGD);  Surgeon: JOletha Blend MD;  [place]  Service: Gastroenterology;  Laterality: N/A;  . tubes in ears    . WISDOM TOOTH EXTRACTION      Family History  Problem Relation Age of Onset  . GER disease Father   . Cancer Father        kidney  . Asthma Brother   . Miscarriages / [place]   . GER disease Maternal Grandmother   . Hyperlipidemia Maternal Grandmother   . ADD / ADHD Brother   . Miscarriages / [place]Mother   . Headache Mother   . Cancer Maternal Grandfather   . Diabetes Maternal Grandfather   . Hyperlipidemia Maternal Grandfather   . Vision loss Maternal Grandfather   . GER disease Maternal Aunt   . Migraines Maternal Aunt   . Anxiety disorder Maternal Aunt   . Diabetes Paternal Aunt   . Cancer Paternal Uncle        spinal cancer  . Ataxia Neg Hx   . [place]  Neg Hx   . Dementia Neg Hx   . Mental retardation Neg Hx   . Multiple sclerosis Neg Hx   . Neurofibromatosis Neg Hx   . Neuropathy Neg Hx   . Parkinsonism Neg Hx   . Seizures Neg Hx   . Stroke Neg Hx   . Depression Neg Hx   . Bipolar disorder Neg Hx     Social History   Tobacco Use  . Smoking status: Never Smoker  . Smokeless tobacco: Never Used  Substance Use Topics  . Alcohol use: No  . Drug use: No    Allergies:  Allergies  Allergen Reactions  . Penicillins Hives, Itching and Rash  . Amoxil [Amoxicillin]     Medications Prior to Admission  Medication Sig Dispense Refill Last Dose  . ACCU-CHEK FASTCLIX LANCETS MISC 1 each by Percutaneous route 4 (four) times daily. 100 each 12 07/28/2018 at Unknown time  . Blood Glucose Monitoring Suppl (ACCU-CHEK GUIDE) w/Device KIT 1 each by Does not apply route 4 (four) times daily. 1  kit 0 07/28/2018 at Unknown time  . glucose blood (ACCU-CHEK GUIDE) test strip Use as instructed to check blood sugar 4 times daily 100 each 12 07/28/2018 at Unknown time  . loratadine (CLARITIN) 10 MG tablet Take 10 mg by mouth daily.   07/28/2018 at Unknown time  . omeprazole (PRILOSEC) 40 MG capsule Take 1 capsule (40 mg total) by mouth daily. 30 capsule 6 07/28/2018 at Unknown time  . Prenatal MV-Min-FA-Omega-3 (PRENATAL GUMMIES/DHA & FA PO) Take by mouth.   07/27/2018 at Unknown time  . acetaminophen (TYLENOL) 500 MG tablet Take 1,000 mg by mouth every 6 (six) hours as needed.   Taking    Review of Systems  Constitutional: Negative.  Negative for fatigue and fever.  HENT: Negative.   Respiratory: Negative.  Negative for shortness of breath.   Cardiovascular: Negative.  Negative for chest pain.  Gastrointestinal: Positive for abdominal pain. Negative for constipation, diarrhea, nausea and vomiting.  Genitourinary: Negative.  Negative for dysuria, vaginal bleeding and vaginal discharge.  Neurological: Negative.  Negative for dizziness and headaches.    Physical Exam   Blood pressure 124/73, pulse (!) 102, temperature 98.4 F (36.9 C), temperature source Oral, resp. rate 16, height 4' 10"  (1.473 m), weight 92.1 kg, SpO2 99 %.  Patient Vitals for the past 24 hrs:  BP Temp Temp src Pulse Resp SpO2 Height Weight  07/28/18 2246 125/76 - - 99 - - - -  07/28/18 2231 130/78 - - 90 - - - -  07/28/18 2216 124/72 - - 91 - - - -  07/28/18 2146 124/73 - - (!) 102 - - - -  07/28/18 2143 120/80 - - 99 - - - -  07/28/18 2126 (!) 138/93 - - (!) 121 - - - -  07/28/18 2125 - - - - - 99 % - -  07/28/18 2103 134/86 98.4 F (36.9 C) Oral (!) 109 16 100 % 4' 10"  (1.473 m) 92.1 kg   Physical Exam  Nursing note and vitals reviewed. Constitutional: She is oriented to person, place, and time. She appears well-developed and well-nourished. No distress.  HENT:  Head: Normocephalic.  Eyes: Pupils are equal, round, and reactive to light.  Cardiovascular: Normal rate, regular rhythm and normal heart sounds.  Respiratory: Effort normal and breath sounds normal. No respiratory distress.  GI: Soft. Bowel sounds are normal. She exhibits no distension. There is no abdominal tenderness.  Neurological: She is alert and oriented to person, place, and time.  Skin: Skin is warm and dry.  Psychiatric: She has a normal mood and affect. Her behavior is normal. Judgment and thought content normal.   Fetal Tracing:  Baseline: 130 Variability: moderate Accels: 15x15 Decels: none  Toco: occasional uc's  Dilation: 1.5 Effacement (%): 60 Cervical Position: Middle Station: -3 Presentation: Vertex Exam by:: TLYTLE RN   MAU Course  Procedures Results for orders placed or performed during the hospital encounter of 07/28/18 (from the past 24 hour(s))  CBC     Status: Abnormal   Collection Time: 07/28/18 10:05 PM  Result Value Ref Range   WBC 12.5 (H) 4.0 - 10.5 K/uL   RBC 4.22 3.87 - 5.11 MIL/uL   Hemoglobin 9.7 (L) 12.0 - 15.0 g/dL   HCT 31.8 (L) 36.0 - 46.0  %   MCV 75.4 (L) 80.0 - 100.0 fL   MCH 23.0 (L) 26.0 - 34.0 pg   MCHC 30.5 30.0 - 36.0 g/dL   RDW 16.1 (H) 11.5 - 15.5 %  Platelets 320 150 - 400 K/uL   nRBC 0.0 0.0 - 0.2 %   MDM UA  One elevated BP upon arrival to MAU. Will evaluate for preeclampsia because of bilateral epigastric pain.   CBC, CMP, Protein/creat ratio  Labs normal, no sign of labor at this time.   Assessment and Plan   1. Elevated blood pressure affecting pregnancy, antepartum   2. Abdominal pain affecting pregnancy   3. [redacted] weeks gestation of pregnancy    -Discharge home in stable condition -Preeclampsia and labor precautions discussed -Patient advised to follow-up with [facility] tomorrow morning as scheduled for prenatal care -Patient may return to MAU as needed or if her condition were to change or worsen  [place] 07/28/2018, 10:31 PM

## 2018-07-28 NOTE — Discharge Instructions (Signed)

## 2018-07-28 NOTE — Telephone Encounter (Signed)
Call from after hours Call nurse service, pt is having contractions and rating the discomfort at 7/10. Lost mucus plug yesterday.  Pt has been advised to come to MAU for assessment. Tilda Burrow, MD

## 2018-07-29 ENCOUNTER — Ambulatory Visit (INDEPENDENT_AMBULATORY_CARE_PROVIDER_SITE_OTHER): Payer: Medicaid Other | Admitting: Obstetrics & Gynecology

## 2018-07-29 VITALS — BP 135/83 | HR 113 | Wt 203.0 lb

## 2018-07-29 DIAGNOSIS — Z331 Pregnant state, incidental: Secondary | ICD-10-CM

## 2018-07-29 DIAGNOSIS — Z1389 Encounter for screening for other disorder: Secondary | ICD-10-CM

## 2018-07-29 DIAGNOSIS — Z3A38 38 weeks gestation of pregnancy: Secondary | ICD-10-CM

## 2018-07-29 DIAGNOSIS — O3663X0 Maternal care for excessive fetal growth, third trimester, not applicable or unspecified: Secondary | ICD-10-CM

## 2018-07-29 DIAGNOSIS — O0993 Supervision of high risk pregnancy, unspecified, third trimester: Secondary | ICD-10-CM

## 2018-07-29 DIAGNOSIS — O24410 Gestational diabetes mellitus in pregnancy, diet controlled: Secondary | ICD-10-CM

## 2018-07-29 LAB — POCT URINALYSIS DIPSTICK OB
Blood, UA: NEGATIVE
Glucose, UA: NEGATIVE
Ketones, UA: NEGATIVE
Leukocytes, UA: NEGATIVE
Nitrite, UA: NEGATIVE
POC,PROTEIN,UA: NEGATIVE

## 2018-07-29 NOTE — Progress Notes (Signed)
HIGH-RISK PREGNANCY VISIT Patient name: Martha Herrera MRN [id number]  Date of birth: [birth year] Chief Complaint:   Routine Prenatal Visit  History of Present Illness:   Martha Herrera is a 23 y.o. G1P0000 female at [redacted]w[redacted]d with an Estimated Date of Delivery: 08/08/18 being seen today for ongoing management of a high-risk pregnancy complicated by gestational DM. + fetal macrosomia Today she reports no complaints. Contractions: Irregular. Vag. Bleeding: None.  Movement: Present. denies leaking of fluid.  Review of Systems:   Pertinent items are noted in HPI Denies abnormal vaginal discharge w/ itching/odor/irritation, headaches, visual changes, shortness of breath, chest pain, abdominal pain, severe nausea/vomiting, or problems with urination or bowel movements unless otherwise stated above. Pertinent History Reviewed:  Reviewed past medical,surgical, social, obstetrical and family history.  Reviewed problem list, medications and allergies. Physical Assessment:   Vitals:   07/29/18 0941  BP: 135/83  Pulse: (!) 113  Weight: 203 lb (92.1 kg)  Body mass index is 42.43 kg/m.           Physical Examination:   General appearance: alert, well appearing, and in no distress  Mental status: alert, oriented to person, place, and time  Skin: warm & dry   Extremities: Edema: Trace    Cardiovascular: normal heart rate noted  Respiratory: normal respiratory effort, no distress  Abdomen: gravid, soft, non-tender  Pelvic: Cervical exam deferred         Fetal Status:     Movement: Present    Fetal Surveillance Testing today: 145   Results for orders placed or performed in visit on 07/29/18 (from the past 24 hour(s))  POC Urinalysis Dipstick OB   Collection Time: 07/29/18  9:44 AM  Result Value Ref Range   Color, UA     Clarity, UA     Glucose, UA Negative Negative   Bilirubin, UA     Ketones, UA neg    Spec Grav, UA     Blood, UA neg    pH, UA     POC,PROTEIN,UA Negative Negative, Trace,  Small (1+), Moderate (2+), Large (3+), 4+   Urobilinogen, UA     Nitrite, UA neg    Leukocytes, UA Negative Negative   Appearance     Odor    Results for orders placed or performed during the hospital encounter of 07/28/18 (from the past 24 hour(s))  Protein / creatinine ratio, urine   Collection Time: 07/28/18 10:02 PM  Result Value Ref Range   Creatinine, Urine 128.51 mg/dL   Total Protein, Urine 13 mg/dL   Protein Creatinine Ratio 0.10 0.00 - 0.15 mg/mg[Cre]  Urinalysis, Routine w reflex microscopic   Collection Time: 07/28/18 10:02 PM  Result Value Ref Range   Color, Urine YELLOW YELLOW   APPearance HAZY (A) CLEAR   Specific Gravity, Urine 1.021 1.005 - 1.030   pH 5.0 5.0 - 8.0   Glucose, UA NEGATIVE NEGATIVE mg/dL   Hgb urine dipstick NEGATIVE NEGATIVE   Bilirubin Urine NEGATIVE NEGATIVE   Ketones, ur NEGATIVE NEGATIVE mg/dL   Protein, ur NEGATIVE NEGATIVE mg/dL   Nitrite NEGATIVE NEGATIVE   Leukocytes,Ua NEGATIVE NEGATIVE  CBC   Collection Time: 07/28/18 10:05 PM  Result Value Ref Range   WBC 12.5 (H) 4.0 - 10.5 K/uL   RBC 4.22 3.87 - 5.11 MIL/uL   Hemoglobin 9.7 (L) 12.0 - 15.0 g/dL   HCT 91.4 (L) 78.2 - 95.6 %   MCV 75.4 (L) 80.0 - 100.0 fL   MCH 23.0 (  L) 26.0 - 34.0 pg   MCHC 30.5 30.0 - 36.0 g/dL   RDW 35.3 (H) 61.4 - 43.1 %   Platelets 320 150 - 400 K/uL   nRBC 0.0 0.0 - 0.2 %  Comprehensive metabolic panel   Collection Time: 07/28/18 10:05 PM  Result Value Ref Range   Sodium 134 (L) 135 - 145 mmol/L   Potassium 3.5 3.5 - 5.1 mmol/L   Chloride 104 98 - 111 mmol/L   CO2 20 (L) 22 - 32 mmol/L   Glucose, Bld 94 70 - 99 mg/dL   BUN 11 6 - 20 mg/dL   Creatinine, Ser 5.40 0.44 - 1.00 mg/dL   Calcium 8.9 8.9 - 08.6 mg/dL   Total Protein 6.2 (L) 6.5 - 8.1 g/dL   Albumin 2.5 (L) 3.5 - 5.0 g/dL   AST 21 15 - 41 U/L   ALT 24 0 - 44 U/L   Alkaline Phosphatase 257 (H) 38 - 126 U/L   Total Bilirubin 0.4 0.3 - 1.2 mg/dL   GFR calc non Af Amer >60 >60 mL/min   GFR  calc Af Amer >60 >60 mL/min   Anion gap 10 5 - 15    Assessment & Plan:  1) High-risk pregnancy G1P0000 at [redacted]w[redacted]d with an Estimated Date of Delivery: 08/08/18   2) 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   Labs/procedures today: CBG are good  Treatment Plan:  Scheduled C section 08/07/2018@1530  In lieu of another visit due to COVID 19 will not see patient until her shceudled C section 4/8 all questions were answered  Reviewed: Term labor symptoms and general obstetric precautions including but not limited to vaginal bleeding, contractions, leaking of fluid and fetal movement were reviewed in detail with the patient.  All questions were answered.  Follow-up: Return in about 17 days (around 08/15/2018) for Post Op, with Dr Despina Hidden.  Orders Placed This Encounter  Procedures  . POC Urinalysis Dipstick OB   Amaryllis Dyke Armenta Erskin  07/29/2018 10:01 AM

## 2018-07-30 ENCOUNTER — Encounter (HOSPITAL_COMMUNITY): Payer: Self-pay

## 2018-07-30 NOTE — Patient Instructions (Addendum)
ANESHIA VILAS  07/30/2018   Your procedure is scheduled on:  08/07/2018  Arrive at 1:15 PM at Entrance C on [address] at [facility] and [employer]. You are invited to use the FREE valet parking or use the Visitor's parking deck.  Pick up the phone at the desk and dial [phone].  Call this number if you have problems the morning of surgery: [phone]  Remember:   Do not eat food:(After Midnight) Desps de medianoche.  Do not drink clear liquids: (6 Hours before arrival) 6 horas ante llegada.  Take these medicines the morning of surgery with A SIP OF WATER:  none   Do not wear jewelry, make-up or nail polish.  Do not wear lotions, powders, or perfumes. Do not wear deodorant.  Do not shave 48 hours prior to surgery.  Do not bring valuables to the hospital.  [facility] is not   responsible for any belongings or valuables brought to the hospital.  Contacts, dentures or bridgework may not be worn into surgery.  Leave suitcase in the car. After surgery it may be brought to your room.  For patients admitted to the hospital, checkout time is 11:00 AM the day of              discharge.      Please read over the following fact sheets that you were given:     Preparing for Surgery

## 2018-08-06 ENCOUNTER — Encounter (HOSPITAL_COMMUNITY)
Admission: RE | Admit: 2018-08-06 | Discharge: 2018-08-06 | Disposition: A | Discharge status: Home / Self Care | Payer: Medicaid Other | Source: Ambulatory Visit

## 2018-08-06 HISTORY — DX: Adverse effect of unspecified anesthetic, initial encounter: T41.45XA

## 2018-08-06 HISTORY — DX: Gestational diabetes mellitus in pregnancy, unspecified control: O24.419

## 2018-08-06 HISTORY — DX: Other complications of anesthesia, initial encounter: T88.59XA

## 2018-08-07 ENCOUNTER — Inpatient Hospital Stay (HOSPITAL_COMMUNITY): Payer: Medicaid Other | Admitting: Certified Registered Nurse Anesthetist

## 2018-08-07 ENCOUNTER — Encounter (HOSPITAL_COMMUNITY)
Admission: AD | Disposition: A | Discharge status: Home / Self Care | Payer: Self-pay | Source: Home / Self Care | Attending: Obstetrics & Gynecology

## 2018-08-07 ENCOUNTER — Other Ambulatory Visit: Payer: Self-pay

## 2018-08-07 ENCOUNTER — Inpatient Hospital Stay (HOSPITAL_COMMUNITY)
Admission: AD | Admit: 2018-08-07 | Discharge: 2018-08-10 | DRG: 788 | Disposition: A | Discharge status: Home / Self Care | Payer: Medicaid Other | Attending: Obstetrics & Gynecology | Admitting: Obstetrics & Gynecology

## 2018-08-07 ENCOUNTER — Encounter (HOSPITAL_COMMUNITY): Payer: Self-pay | Admitting: *Deleted

## 2018-08-07 DIAGNOSIS — O24410 Gestational diabetes mellitus in pregnancy, diet controlled: Secondary | ICD-10-CM

## 2018-08-07 DIAGNOSIS — O3663X0 Maternal care for excessive fetal growth, third trimester, not applicable or unspecified: Secondary | ICD-10-CM | POA: Diagnosis not present

## 2018-08-07 DIAGNOSIS — K219 Gastro-esophageal reflux disease without esophagitis: Secondary | ICD-10-CM | POA: Diagnosis not present

## 2018-08-07 DIAGNOSIS — O24419 Gestational diabetes mellitus in pregnancy, unspecified control: Secondary | ICD-10-CM | POA: Diagnosis not present

## 2018-08-07 DIAGNOSIS — O24420 Gestational diabetes mellitus in childbirth, diet controlled: Principal | ICD-10-CM | POA: Diagnosis present

## 2018-08-07 DIAGNOSIS — Z880 Allergy status to penicillin: Secondary | ICD-10-CM

## 2018-08-07 DIAGNOSIS — O0990 Supervision of high risk pregnancy, unspecified, unspecified trimester: Secondary | ICD-10-CM

## 2018-08-07 DIAGNOSIS — Z3A39 39 weeks gestation of pregnancy: Secondary | ICD-10-CM | POA: Diagnosis not present

## 2018-08-07 DIAGNOSIS — Z3A00 Weeks of gestation of pregnancy not specified: Secondary | ICD-10-CM | POA: Diagnosis not present

## 2018-08-07 DIAGNOSIS — O9081 Anemia of the puerperium: Secondary | ICD-10-CM | POA: Diagnosis present

## 2018-08-07 DIAGNOSIS — O2402 Pre-existing diabetes mellitus, type 1, in childbirth: Secondary | ICD-10-CM | POA: Diagnosis not present

## 2018-08-07 DIAGNOSIS — O9962 Diseases of the digestive system complicating childbirth: Secondary | ICD-10-CM | POA: Diagnosis not present

## 2018-08-07 DIAGNOSIS — E109 Type 1 diabetes mellitus without complications: Secondary | ICD-10-CM | POA: Diagnosis not present

## 2018-08-07 DIAGNOSIS — Z98891 History of uterine scar from previous surgery: Secondary | ICD-10-CM

## 2018-08-07 LAB — GLUCOSE, CAPILLARY
Glucose-Capillary: 68 mg/dL — ABNORMAL LOW (ref 70–99)
Glucose-Capillary: 81 mg/dL (ref 70–99)
Glucose-Capillary: 66 mg/dL — ABNORMAL LOW (ref 70–99)
Glucose-Capillary: 70 mg/dL (ref 70–99)

## 2018-08-07 LAB — TYPE AND SCREEN
ABO/RH(D): O POS
Antibody Screen: NEGATIVE

## 2018-08-07 LAB — CBC
HCT: 33.4 % — ABNORMAL LOW (ref 36.0–46.0)
Hemoglobin: 9.9 g/dL — ABNORMAL LOW (ref 12.0–15.0)
MCH: 22 pg — ABNORMAL LOW (ref 26.0–34.0)
MCHC: 29.6 g/dL — ABNORMAL LOW (ref 30.0–36.0)
MCV: 74.4 fL — ABNORMAL LOW (ref 80.0–100.0)
Platelets: 334 10*3/uL (ref 150–400)
RBC: 4.49 MIL/uL (ref 3.87–5.11)
RDW: 16.5 % — ABNORMAL HIGH (ref 11.5–15.5)
WBC: 10.4 10*3/uL (ref 4.0–10.5)
nRBC: 0 % (ref 0.0–0.2)

## 2018-08-07 SURGERY — Surgical Case
Anesthesia: Spinal

## 2018-08-07 MED ORDER — GENTAMICIN SULFATE 40 MG/ML IJ SOLN
5.0000 mg/kg | INTRAVENOUS | Status: AC
  Administered 2018-08-07: 281.6 mg via INTRAVENOUS
  Filled 2018-08-07 (×2): qty 7

## 2018-08-07 MED ORDER — ZOLPIDEM TARTRATE 5 MG PO TABS: 5.0000 mg | ORAL_TABLET | Freq: Every evening | ORAL | Status: DC | PRN

## 2018-08-07 MED ORDER — NALBUPHINE HCL 10 MG/ML IJ SOLN: 5.0000 mg | INTRAMUSCULAR | Status: DC | PRN

## 2018-08-07 MED ORDER — SODIUM CHLORIDE 0.9% FLUSH: 3.0000 mL | INTRAVENOUS | Status: DC | PRN

## 2018-08-07 MED ORDER — MEPERIDINE HCL 25 MG/ML IJ SOLN: 6.2500 mg | INTRAMUSCULAR | Status: DC | PRN

## 2018-08-07 MED ORDER — ACETAMINOPHEN 160 MG/5ML PO SOLN: 325.0000 mg | ORAL | Status: DC | PRN

## 2018-08-07 MED ORDER — FENTANYL CITRATE (PF) 100 MCG/2ML IJ SOLN
INTRAMUSCULAR | Status: DC | PRN
  Administered 2018-08-07: 15 ug via INTRATHECAL

## 2018-08-07 MED ORDER — WITCH HAZEL-GLYCERIN EX PADS: 1.0000 "application " | MEDICATED_PAD | CUTANEOUS | Status: DC | PRN

## 2018-08-07 MED ORDER — MENTHOL 3 MG MT LOZG: 1.0000 | LOZENGE | OROMUCOSAL | Status: DC | PRN

## 2018-08-07 MED ORDER — NALOXONE HCL 0.4 MG/ML IJ SOLN: 0.4000 mg | INTRAMUSCULAR | Status: DC | PRN

## 2018-08-07 MED ORDER — SIMETHICONE 80 MG PO CHEW
80.0000 mg | CHEWABLE_TABLET | Freq: Three times a day (TID) | ORAL | Status: DC
  Administered 2018-08-08 – 2018-08-10 (×7): 80 mg via ORAL
  Filled 2018-08-07 (×8): qty 1

## 2018-08-07 MED ORDER — OXYCODONE-ACETAMINOPHEN 5-325 MG PO TABS: 1.0000 | ORAL_TABLET | ORAL | Status: DC | PRN

## 2018-08-07 MED ORDER — KETOROLAC TROMETHAMINE 30 MG/ML IJ SOLN
30.0000 mg | Freq: Four times a day (QID) | INTRAMUSCULAR | Status: AC
  Administered 2018-08-07 – 2018-08-08 (×3): 30 mg via INTRAVENOUS
  Filled 2018-08-07 (×3): qty 1

## 2018-08-07 MED ORDER — PHENYLEPHRINE HCL-NACL 20-0.9 MG/250ML-% IV SOLN
INTRAVENOUS | Status: AC
  Filled 2018-08-07: qty 250

## 2018-08-07 MED ORDER — SCOPOLAMINE 1 MG/3DAYS TD PT72
1.0000 | MEDICATED_PATCH | Freq: Once | TRANSDERMAL | Status: DC
  Administered 2018-08-07: 18:00:00 1.5 mg via TRANSDERMAL

## 2018-08-07 MED ORDER — KETOROLAC TROMETHAMINE 30 MG/ML IJ SOLN: 30.0000 mg | Freq: Four times a day (QID) | INTRAMUSCULAR | Status: AC | PRN

## 2018-08-07 MED ORDER — IBUPROFEN 800 MG PO TABS
800.0000 mg | ORAL_TABLET | Freq: Four times a day (QID) | ORAL | Status: DC
  Administered 2018-08-08 – 2018-08-10 (×8): 800 mg via ORAL
  Filled 2018-08-07 (×8): qty 1

## 2018-08-07 MED ORDER — NALBUPHINE HCL 10 MG/ML IJ SOLN: 5.0000 mg | Freq: Once | INTRAMUSCULAR | Status: DC | PRN

## 2018-08-07 MED ORDER — ONDANSETRON HCL 4 MG/2ML IJ SOLN
INTRAMUSCULAR | Status: AC
  Filled 2018-08-07: qty 2

## 2018-08-07 MED ORDER — KETOROLAC TROMETHAMINE 30 MG/ML IJ SOLN
30.0000 mg | Freq: Four times a day (QID) | INTRAMUSCULAR | Status: AC | PRN
  Administered 2018-08-07: 30 mg via INTRAVENOUS

## 2018-08-07 MED ORDER — LACTATED RINGERS IV SOLN
INTRAVENOUS | Status: DC
  Administered 2018-08-08: 01:00:00 via INTRAVENOUS

## 2018-08-07 MED ORDER — SENNOSIDES-DOCUSATE SODIUM 8.6-50 MG PO TABS
2.0000 | ORAL_TABLET | ORAL | Status: DC
  Administered 2018-08-07 – 2018-08-10 (×3): 2 via ORAL
  Filled 2018-08-07 (×3): qty 2

## 2018-08-07 MED ORDER — PRENATAL GUMMIES/DHA & FA 0.4-32.5 MG PO CHEW: CHEWABLE_TABLET | Freq: Two times a day (BID) | ORAL | Status: DC

## 2018-08-07 MED ORDER — PANTOPRAZOLE SODIUM 40 MG PO TBEC
40.0000 mg | DELAYED_RELEASE_TABLET | Freq: Every day | ORAL | Status: DC
  Administered 2018-08-08 – 2018-08-10 (×3): 40 mg via ORAL
  Filled 2018-08-07 (×3): qty 1

## 2018-08-07 MED ORDER — LORATADINE 10 MG PO TABS
10.0000 mg | ORAL_TABLET | Freq: Every day | ORAL | Status: DC
  Administered 2018-08-08 – 2018-08-10 (×3): 10 mg via ORAL
  Filled 2018-08-07 (×3): qty 1

## 2018-08-07 MED ORDER — FENTANYL CITRATE (PF) 100 MCG/2ML IJ SOLN
INTRAMUSCULAR | Status: AC
  Filled 2018-08-07: qty 2

## 2018-08-07 MED ORDER — KETOROLAC TROMETHAMINE 30 MG/ML IJ SOLN
INTRAMUSCULAR | Status: AC
  Filled 2018-08-07: qty 1

## 2018-08-07 MED ORDER — ONDANSETRON HCL 4 MG/2ML IJ SOLN
INTRAMUSCULAR | Status: DC | PRN
  Administered 2018-08-07: 4 mg via INTRAVENOUS

## 2018-08-07 MED ORDER — ENOXAPARIN SODIUM 40 MG/0.4ML ~~LOC~~ SOLN
40.0000 mg | SUBCUTANEOUS | Status: DC
  Administered 2018-08-08 – 2018-08-10 (×3): 40 mg via SUBCUTANEOUS
  Filled 2018-08-07 (×3): qty 0.4

## 2018-08-07 MED ORDER — PHENYLEPHRINE HCL-NACL 20-0.9 MG/250ML-% IV SOLN
INTRAVENOUS | Status: DC | PRN
  Administered 2018-08-07: 60 ug/min via INTRAVENOUS

## 2018-08-07 MED ORDER — OXYCODONE HCL 5 MG/5ML PO SOLN: 5.0000 mg | Freq: Once | ORAL | Status: DC | PRN

## 2018-08-07 MED ORDER — FLUTICASONE PROPIONATE 50 MCG/ACT NA SUSP
1.0000 | Freq: Every day | NASAL | Status: DC
  Administered 2018-08-08 – 2018-08-10 (×2): 1 via NASAL
  Filled 2018-08-07 (×2): qty 16

## 2018-08-07 MED ORDER — SCOPOLAMINE 1 MG/3DAYS TD PT72
MEDICATED_PATCH | TRANSDERMAL | Status: AC
  Filled 2018-08-07: qty 1

## 2018-08-07 MED ORDER — DEXTROSE 50 % IV SOLN
INTRAVENOUS | Status: AC
  Filled 2018-08-07: qty 50

## 2018-08-07 MED ORDER — SIMETHICONE 80 MG PO CHEW
80.0000 mg | CHEWABLE_TABLET | ORAL | Status: DC
  Administered 2018-08-07 – 2018-08-10 (×3): 80 mg via ORAL
  Filled 2018-08-07 (×3): qty 1

## 2018-08-07 MED ORDER — SODIUM CHLORIDE 0.9 % IV SOLN
INTRAVENOUS | Status: DC | PRN
  Administered 2018-08-07: 40 [IU] via INTRAVENOUS

## 2018-08-07 MED ORDER — MORPHINE SULFATE (PF) 0.5 MG/ML IJ SOLN
INTRAMUSCULAR | Status: AC
  Filled 2018-08-07: qty 10

## 2018-08-07 MED ORDER — SODIUM CHLORIDE 0.9 % IV SOLN
INTRAVENOUS | Status: DC | PRN
  Administered 2018-08-07: 16:00:00 via INTRAVENOUS

## 2018-08-07 MED ORDER — OXYTOCIN 40 UNITS IN NORMAL SALINE INFUSION - SIMPLE MED
INTRAVENOUS | Status: AC
  Filled 2018-08-07: qty 1000

## 2018-08-07 MED ORDER — DIPHENHYDRAMINE HCL 25 MG PO CAPS: 25.0000 mg | ORAL_CAPSULE | Freq: Four times a day (QID) | ORAL | Status: DC | PRN

## 2018-08-07 MED ORDER — DEXTROSE 50 % IV SOLN
12.5000 g | INTRAVENOUS | Status: AC
  Administered 2018-08-07: 12.5 g via INTRAVENOUS

## 2018-08-07 MED ORDER — LACTATED RINGERS IV SOLN
INTRAVENOUS | Status: DC
  Administered 2018-08-07 (×2): via INTRAVENOUS

## 2018-08-07 MED ORDER — OXYCODONE HCL 5 MG PO TABS: 5.0000 mg | ORAL_TABLET | Freq: Once | ORAL | Status: DC | PRN

## 2018-08-07 MED ORDER — FENTANYL CITRATE (PF) 100 MCG/2ML IJ SOLN: 25.0000 ug | INTRAMUSCULAR | Status: DC | PRN

## 2018-08-07 MED ORDER — METHYLERGONOVINE MALEATE 0.2 MG/ML IJ SOLN: 0.2000 mg | INTRAMUSCULAR | Status: DC | PRN

## 2018-08-07 MED ORDER — CLINDAMYCIN PHOSPHATE 900 MG/50ML IV SOLN
INTRAVENOUS | Status: AC
  Filled 2018-08-07: qty 50

## 2018-08-07 MED ORDER — ONDANSETRON HCL 4 MG/2ML IJ SOLN: 4.0000 mg | Freq: Three times a day (TID) | INTRAMUSCULAR | Status: DC | PRN

## 2018-08-07 MED ORDER — TETANUS-DIPHTH-ACELL PERTUSSIS 5-2.5-18.5 LF-MCG/0.5 IM SUSP: 0.5000 mL | Freq: Once | INTRAMUSCULAR | Status: DC

## 2018-08-07 MED ORDER — DIPHENHYDRAMINE HCL 25 MG PO CAPS: 25.0000 mg | ORAL_CAPSULE | ORAL | Status: DC | PRN

## 2018-08-07 MED ORDER — COMPLETENATE 29-1 MG PO CHEW
1.0000 | CHEWABLE_TABLET | Freq: Every day | ORAL | Status: DC
  Administered 2018-08-08 – 2018-08-10 (×3): 1 via ORAL
  Filled 2018-08-07 (×3): qty 1

## 2018-08-07 MED ORDER — DIPHENHYDRAMINE HCL 50 MG/ML IJ SOLN: 12.5000 mg | INTRAMUSCULAR | Status: DC | PRN

## 2018-08-07 MED ORDER — ACETAMINOPHEN 325 MG PO TABS: 325.0000 mg | ORAL_TABLET | ORAL | Status: DC | PRN

## 2018-08-07 MED ORDER — CLINDAMYCIN PHOSPHATE 900 MG/50ML IV SOLN
900.0000 mg | INTRAVENOUS | Status: AC
  Administered 2018-08-07: 900 mg via INTRAVENOUS

## 2018-08-07 MED ORDER — PRENATAL MULTIVITAMIN CH: 1.0000 | ORAL_TABLET | Freq: Every day | ORAL | Status: DC

## 2018-08-07 MED ORDER — BUPIVACAINE IN DEXTROSE 0.75-8.25 % IT SOLN
INTRATHECAL | Status: DC | PRN
  Administered 2018-08-07: 1.1 mL via INTRATHECAL

## 2018-08-07 MED ORDER — SIMETHICONE 80 MG PO CHEW
80.0000 mg | CHEWABLE_TABLET | ORAL | Status: DC | PRN
  Administered 2018-08-08: 18:00:00 80 mg via ORAL

## 2018-08-07 MED ORDER — GABAPENTIN 100 MG PO CAPS
100.0000 mg | ORAL_CAPSULE | Freq: Three times a day (TID) | ORAL | Status: DC
  Administered 2018-08-07 – 2018-08-10 (×8): 100 mg via ORAL
  Filled 2018-08-07 (×8): qty 1

## 2018-08-07 MED ORDER — OXYTOCIN 40 UNITS IN NORMAL SALINE INFUSION - SIMPLE MED: 2.5000 [IU]/h | INTRAVENOUS | Status: AC

## 2018-08-07 MED ORDER — METHYLERGONOVINE MALEATE 0.2 MG PO TABS: 0.2000 mg | ORAL_TABLET | ORAL | Status: DC | PRN

## 2018-08-07 MED ORDER — NALOXONE HCL 4 MG/10ML IJ SOLN
1.0000 ug/kg/h | INTRAVENOUS | Status: DC | PRN
  Filled 2018-08-07: qty 5

## 2018-08-07 MED ORDER — MORPHINE SULFATE (PF) 0.5 MG/ML IJ SOLN
INTRAMUSCULAR | Status: DC | PRN
  Administered 2018-08-07: .15 mg via INTRATHECAL

## 2018-08-07 MED ORDER — COCONUT OIL OIL: 1.0000 "application " | TOPICAL_OIL | Status: DC | PRN

## 2018-08-07 MED ORDER — ONDANSETRON HCL 4 MG/2ML IJ SOLN: 4.0000 mg | Freq: Once | INTRAMUSCULAR | Status: DC | PRN

## 2018-08-07 SURGICAL SUPPLY — 38 items
CHLORAPREP W/TINT 26ML (MISCELLANEOUS) ×3 IMPLANT
CLAMP CORD UMBIL (MISCELLANEOUS) IMPLANT
CLOTH BEACON ORANGE TIMEOUT ST (SAFETY) ×3 IMPLANT
DERMABOND ADVANCED (GAUZE/BANDAGES/DRESSINGS) ×4
DERMABOND ADVANCED .7 DNX12 (GAUZE/BANDAGES/DRESSINGS) ×2 IMPLANT
DRSG OPSITE POSTOP 4X10 (GAUZE/BANDAGES/DRESSINGS) ×3 IMPLANT
ELECT REM PT RETURN 9FT ADLT (ELECTROSURGICAL) ×3
ELECTRODE REM PT RTRN 9FT ADLT (ELECTROSURGICAL) ×1 IMPLANT
EXTRACTOR VACUUM BELL STYLE (SUCTIONS) IMPLANT
GLOVE BIOGEL PI IND STRL 7.0 (GLOVE) ×1 IMPLANT
GLOVE BIOGEL PI IND STRL 8 (GLOVE) ×1 IMPLANT
GLOVE BIOGEL PI INDICATOR 7.0 (GLOVE) ×2
GLOVE BIOGEL PI INDICATOR 8 (GLOVE) ×2
GLOVE ECLIPSE 8.0 STRL XLNG CF (GLOVE) ×3 IMPLANT
GOWN STRL REUS W/TWL LRG LVL3 (GOWN DISPOSABLE) ×6 IMPLANT
HOVERMATT SINGLE USE (MISCELLANEOUS) ×3 IMPLANT
KIT ABG SYR 3ML LUER SLIP (SYRINGE) ×3 IMPLANT
NEEDLE HYPO 18GX1.5 BLUNT FILL (NEEDLE) ×3 IMPLANT
NEEDLE HYPO 22GX1.5 SAFETY (NEEDLE) ×3 IMPLANT
NEEDLE HYPO 25X5/8 SAFETYGLIDE (NEEDLE) ×3 IMPLANT
NS IRRIG 1000ML POUR BTL (IV SOLUTION) ×3 IMPLANT
PACK C SECTION WH (CUSTOM PROCEDURE TRAY) ×3 IMPLANT
PAD OB MATERNITY 4.3X12.25 (PERSONAL CARE ITEMS) ×3 IMPLANT
PENCIL SMOKE EVAC W/HOLSTER (ELECTROSURGICAL) ×3 IMPLANT
RTRCTR C-SECT PINK 25CM LRG (MISCELLANEOUS) IMPLANT
SUT CHROMIC 0 CT 1 (SUTURE) ×3 IMPLANT
SUT MNCRL 0 VIOLET CTX 36 (SUTURE) ×2 IMPLANT
SUT MONOCRYL 0 CTX 36 (SUTURE) ×4
SUT PLAIN 2 0 (SUTURE)
SUT PLAIN 2 0 XLH (SUTURE) IMPLANT
SUT PLAIN ABS 2-0 CT1 27XMFL (SUTURE) IMPLANT
SUT VIC AB 0 CTX 36 (SUTURE) ×4
SUT VIC AB 0 CTX36XBRD ANBCTRL (SUTURE) ×2 IMPLANT
SUT VIC AB 4-0 KS 27 (SUTURE) IMPLANT
SYR 20CC LL (SYRINGE) ×6 IMPLANT
TOWEL OR 17X24 6PK STRL BLUE (TOWEL DISPOSABLE) ×3 IMPLANT
TRAY FOLEY W/BAG SLVR 14FR LF (SET/KITS/TRAYS/PACK) IMPLANT
WATER STERILE IRR 1000ML POUR (IV SOLUTION) ×3 IMPLANT

## 2018-08-07 NOTE — Lactation Note (Signed)
This note was copied from a baby's chart. Lactation Consultation Note  Patient Name: Martha Herrera TJQZE'S Date: 08/07/2018 Reason for consult: Initial assessment;1st time breastfeeding;Term P1, 4 hour female infant, LGA greater than 9 lbs at birth. 2nd time breastfeeding and infant breastfeed in L&D. LC change one void diaper while in room. Mom is active on [facility] program in [facility] she did not attend any Breastfeeding classes in her pregnancy. Mom has DEBP at home. LC entered the room infant was cuing to breastfeed and LC discussed with parents how to identify hunger cues with infant. Mom demonstrated hand expression and taught back, infant was given 4 ml of colostrum by spoon.  LC noticed mom has flat nipples that responses well to stimulation, mom did breast stimulation and a little hand expression prior to latching infant to breast. LC assess mom doesn't need a NS at this time, mom was given breast shells and explained how to use them, mom knows to wear them in her bra during the day and not sleep in them at night. Mom latched infant on right breast using the football hold, infant latched with wide mouth, swallows could be heard, infant was still breastfeeding (20 minutes) when LC left the room. Mom knows to breastfeed infant according hunger cues, 8 or more times within 24 hours. Mom knows to ask for Nurse or LC if she has any questions, concerns or need assistance with latching infant to breast. LC discussed I & O. Reviewed Baby & Me book's Breastfeeding Basics.  Mom made aware of O/P services, breastfeeding support groups, community resources, and our phone # for post-discharge questions.  Mom's plan: 1. Breastfeed according hunger cues, 8 or more times within 24 hours. 2. Do breast stimulation prior to latching infant to breast. 3. To wear breast shells in bra during the day. 4. Mom can hand express and give infant back volume her choice when she chooses to do so. 5. Family  will do STS as much as possible.   Maternal Data Formula Feeding for Exclusion: No Has patient been taught Hand Expression?: Yes(Infant was given 5 ml of colostrum by spoon.) Does the patient have breastfeeding experience prior to this delivery?: No  Feeding Feeding Type: Breast Fed  LATCH Score Latch: Grasps breast easily, tongue down, lips flanged, rhythmical sucking.  Audible Swallowing: Spontaneous and intermittent  Type of Nipple: Flat  Comfort (Breast/Nipple): Soft / non-tender  Hold (Positioning): Assistance needed to correctly position infant at breast and maintain latch.  LATCH Score: 8  Interventions Interventions: Breast feeding basics reviewed;Assisted with latch;Skin to skin;Breast massage;Hand express;Breast compression;Adjust position;Support pillows;Position options;Expressed milk;Shells  Lactation Tools Discussed/Used Tools: Shells WIC Program: Yes   Consult Status Consult Status: Follow-up Date: 08/08/18 Follow-up type: In-patient    Danelle Earthly 08/07/2018, 8:36 PM

## 2018-08-07 NOTE — Transfer of Care (Signed)
Immediate Anesthesia Transfer of Care Note  Patient: Martha Herrera  Procedure(s) Performed: CESAREAN SECTION (N/A )  Patient Location: PACU  Anesthesia Type:Spinal  Level of Consciousness: awake, alert  and oriented  Airway & Oxygen Therapy: Patient Spontanous Breathing  Post-op Assessment: Report given to RN and Post -op Vital signs reviewed and stable  Post vital signs: Reviewed and stable  Last Vitals:  Vitals Value Taken Time  BP 90/46 08/07/2018  5:05 PM  Temp    Pulse 94 08/07/2018  5:12 PM  Resp 24 08/07/2018  5:12 PM  SpO2 98 % 08/07/2018  5:12 PM  Vitals shown include unvalidated device data.  Last Pain:  Vitals:   08/07/18 1705  TempSrc: (P) Oral  PainSc: (P) 0-No pain         Complications: No apparent anesthesia complications

## 2018-08-07 NOTE — Anesthesia Preprocedure Evaluation (Signed)
Anesthesia Evaluation  Patient identified by MRN, date of birth, ID band Patient awake    Reviewed: Allergy & Precautions, H&P , NPO status , Patient's Chart, lab work & pertinent test results, reviewed documented beta blocker date and time   History of Anesthesia Complications (+) PROLONGED EMERGENCE and history of anesthetic complications  Airway Mallampati: II  TM Distance: >3 FB Neck ROM: full    Dental no notable dental hx.    Pulmonary neg pulmonary ROS,    Pulmonary exam normal breath sounds clear to auscultation       Cardiovascular negative cardio ROS Normal cardiovascular exam Rhythm:regular Rate:Normal     Neuro/Psych  Headaches, negative psych ROS   GI/Hepatic Neg liver ROS, GERD  Medicated,  Endo/Other  diabetes, Gestational  Renal/GU negative Renal ROS  negative genitourinary   Musculoskeletal   Abdominal   Peds  Hematology negative hematology ROS (+)   Anesthesia Other Findings   Reproductive/Obstetrics (+) Pregnancy                             Anesthesia Physical Anesthesia Plan  ASA: III  Anesthesia Plan: Spinal   Post-op Pain Management:    Induction:   PONV Risk Score and Plan:   Airway Management Planned:   Additional Equipment:   Intra-op Plan:   Post-operative Plan:   Informed Consent: I have reviewed the patients History and Physical, chart, labs and discussed the procedure including the risks, benefits and alternatives for the proposed anesthesia with the patient or authorized representative who has indicated his/her understanding and acceptance.       Plan Discussed with: Anesthesiologist  Anesthesia Plan Comments: (  )        Anesthesia Quick Evaluation

## 2018-08-07 NOTE — Anesthesia Postprocedure Evaluation (Signed)
Anesthesia Post Note  Patient: Martha Herrera  Procedure(s) Performed: CESAREAN SECTION (N/A )     Patient location during evaluation: PACU Anesthesia Type: Spinal Level of consciousness: oriented and awake and alert Pain management: pain level controlled Vital Signs Assessment: post-procedure vital signs reviewed and stable Respiratory status: spontaneous breathing, respiratory function stable and patient connected to nasal cannula oxygen Cardiovascular status: blood pressure returned to baseline and stable Postop Assessment: no headache, no backache and no apparent nausea or vomiting Anesthetic complications: no    Last Vitals:  Vitals:   08/07/18 1745 08/07/18 1800  BP: 102/70 108/71  Pulse: 86 82  Resp: 14 19  Temp:    SpO2: 98% 99%    Last Pain:  Vitals:   08/07/18 1745  TempSrc:   PainSc: 0-No pain                 Haelie Clapp

## 2018-08-07 NOTE — Op Note (Signed)
Preoperative diagnosis:  1.  Intrauterine pregnancy at [redacted]w[redacted]d  weeks gestation                                         2.  Class A1 DM                                         3.  Suspected fetal macrosomia   Postoperative diagnosis:  Same as above   Procedure:  Primary cesarean section  Surgeon:  Lazaro Arms MD  Assistant:    Anesthesia: Spinal  Findings:  .    Over a low transverse incision was delivered a viable female with Apgars of 8 and 9 weighing pending lbs.  oz. Uterus, tubes and ovaries were all normal.  There were no other significant findings  Description of operation:  Patient was taken to the operating room and placed in the sitting position where she underwent a spinal anesthetic. She was then placed in the supine position with tilt to the left side. When adequate anesthetic level was obtained she was prepped and draped in usual sterile fashion and a Foley catheter was placed. A Pfannenstiel skin incision was made and carried down sharply to the rectus fascia which was scored in the midline extended laterally. The fascia was taken off the muscles both superiorly and without difficulty. The muscles were divided.  The peritoneal cavity was entered.  Bladder blade was placed, no bladder flap was created.  A low transverse hysterotomy incision was made and delivered a viable female  infant at 10 with Apgars of 8 and 9 weighingpending lbs  oz.  Cord pH was obtained and was pending. The uterus was exteriorized. It was closed in 2 layers, the first being a running interlocking layer and the second being an imbricating layer using 0 monocryl on a CTX needle. There was good resulting hemostasis. The uterus tubes and ovaries were all normal. Peritoneal cavity was irrigated vigorously. The muscles and peritoneum were reapproximated loosely. The fascia was closed using 0 Vicryl in running fashion. Subcutaneous tissue was made hemostatic and irrigated. The skin was closed using 4-0 Vicryl on a  Keith needle in a subcuticular fashion.  Dermabond was placed for additional wound integrity and to serve as a barrier. Blood loss for the procedure was 500 cc. The patient received a gram of Ancef prophylactically. The patient was taken to the recovery room in good stable condition with all counts being correct x3.  EBL 500 cc  Lazaro Arms 08/07/2018 4:51 PM

## 2018-08-07 NOTE — Anesthesia Procedure Notes (Signed)
Spinal  Patient location during procedure: OR Start time: 08/07/2018 4:00 PM End time: 08/07/2018 4:02 PM Staffing Anesthesiologist: Bethena Midget, MD Preanesthetic Checklist Completed: patient identified, site marked, surgical consent, pre-op evaluation, timeout performed, IV checked, risks and benefits discussed and monitors and equipment checked Spinal Block Patient position: sitting Prep: DuraPrep Patient monitoring: heart rate, cardiac monitor, continuous pulse ox and blood pressure Approach: midline Location: L4-5 Injection technique: single-shot Needle Needle type: Sprotte  Needle gauge: 24 G Needle length: 9 cm Assessment Sensory level: T4

## 2018-08-07 NOTE — H&P (Signed)
Martha Herrera is a 23 y.o. female G1P0000 [redacted]w[redacted]d Estimated Date of Delivery: 08/08/18 presenting for primary Caesarean section due to Class A1 DM and EFW 4550 grams, extrapolated from a 36.4 week sonogram.  Fetal vertex is out of pelvis.  Discussed options and pt aware of shoulder dystocia rate of 25% in this setting.  She opted for primary Caesarean delivery.   OB History    Gravida  1   Para  0   Term  0   Preterm  0   AB  0   Living  0     SAB  0   TAB  0   Ectopic  0   Multiple  0   Live Births  0          Past Medical History:  Diagnosis Date  . Complication of anesthesia    hard to wake up  . Gastroesophageal reflux    takes prescribed meds daily  . Gestational diabetes   . Headache(784.0)    not as often  . Heart murmur   . Pneumonia 2013  . Varicella    Past Surgical History:  Procedure Laterality Date  . ESOPHAGOGASTRODUODENOSCOPY  05/03/2012   Procedure: ESOPHAGOGASTRODUODENOSCOPY (EGD);  Surgeon: Jon Gills, MD;  Location: [facility] OR;  Service: Gastroenterology;  Laterality: N/A;  . tubes in ears    . WISDOM TOOTH EXTRACTION     Family History: family history includes ADD / ADHD in her brother; Anxiety disorder in her maternal aunt; Asthma in her brother; Cancer in her father, maternal grandfather, and paternal uncle; Diabetes in her maternal grandfather and paternal aunt; GER disease in her father, maternal aunt, and maternal grandmother; Headache in her mother; Hyperlipidemia in her maternal grandfather and maternal grandmother; Migraines in her maternal aunt; Miscarriages / Stillbirths in her brother and mother; Vision loss in her maternal grandfather. Social History:  reports that she has never smoked. She has never used smokeless tobacco. She reports that she does not drink alcohol or use drugs.     Maternal Diabetes: Yes:  Diabetes Type:  Diet controlled Genetic Screening: Normal Maternal Ultrasounds/Referrals: Normal Fetal Ultrasounds or other  Referrals:  None Maternal Substance Abuse:  No Significant Maternal Medications:  None Significant Maternal Lab Results:  None Other Comments:  Fetal macrosomia suspected  ROS   Review of Systems  Constitutional: Negative for fever, chills, weight loss, malaise/fatigue and diaphoresis.  HENT: Negative for hearing loss, ear pain, nosebleeds, congestion, sore throat, neck pain, tinnitus and ear discharge.   Eyes: Negative for blurred vision, double vision, photophobia, pain, discharge and redness.  Respiratory: Negative for cough, hemoptysis, sputum production, shortness of breath, wheezing and stridor.   Cardiovascular: Negative for chest pain, palpitations, orthopnea, claudication, leg swelling and PND.  Gastrointestinal: negative for abdominal pain. Negative for heartburn, nausea, vomiting, diarrhea, constipation, blood in stool and melena.  Genitourinary: Negative for dysuria, urgency, frequency, hematuria and flank pain.  Musculoskeletal: Negative for myalgias, back pain, joint pain and falls.  Skin: Negative for itching and rash.  Neurological: Negative for dizziness, tingling, tremors, sensory change, speech change, focal weakness, seizures, loss of consciousness, weakness and headaches.  Endo/Heme/Allergies: Negative for environmental allergies and polydipsia. Does not bruise/bleed easily.  Psychiatric/Behavioral: Negative for depression, suicidal ideas, hallucinations, memory loss and substance abuse. The patient is not nervous/anxious and does not have insomnia.      History  Past Medical History:  Diagnosis Date  . Complication of anesthesia    hard to  wake up  . Gastroesophageal reflux    takes prescribed meds daily  . Gestational diabetes   . Headache(784.0)    not as often  . Heart murmur   . Pneumonia 2013  . Varicella     Past Surgical History:  Procedure Laterality Date  . ESOPHAGOGASTRODUODENOSCOPY  05/03/2012   Procedure: ESOPHAGOGASTRODUODENOSCOPY (EGD);   Surgeon: Jon Gills, MD;  Location: [facility] OR;  Service: Gastroenterology;  Laterality: N/A;  . tubes in ears    . WISDOM TOOTH EXTRACTION      OB History    Gravida  1   Para  0   Term  0   Preterm  0   AB  0   Living  0     SAB  0   TAB  0   Ectopic  0   Multiple  0   Live Births  0           Allergies  Allergen Reactions  . Penicillins Hives, Itching and Rash    Did it involve swelling of the face/tongue/throat, SOB, or low BP? No Did it involve sudden or severe rash/hives, skin peeling, or any reaction on the inside of your mouth or nose? No Did you need to seek medical attention at a hospital or doctor's office? No When did it last happen?childhood allergy If all above answers are "NO", may proceed with cephalosporin use.   Marland Kitchen Amoxil [Amoxicillin] Itching and Rash    Did it involve swelling of the face/tongue/throat, SOB, or low BP? No Did it involve sudden or severe rash/hives, skin peeling, or any reaction on the inside of your mouth or nose? No Did you need to seek medical attention at a hospital or doctor's office? No When did it last happen?2019 If all above answers are "NO", may proceed with cephalosporin use.    Social History   Socioeconomic History  . Marital status: Married    Spouse name: Ziham Keeble  . Number of children: Not on file  . Years of education: Not on file  . Highest education level: Associate degree: academic program  Occupational History  . Not on file  Social Needs  . Financial resource strain: Not very hard  . Food insecurity:    Worry: Never true    Inability: Never true  . Transportation needs:    Medical: No    Non-medical: No  Tobacco Use  . Smoking status: Never Smoker  . Smokeless tobacco: Never Used  Substance and Sexual Activity  . Alcohol use: No  . Drug use: No  . Sexual activity: Yes    Birth control/protection: None  Lifestyle  . Physical activity:    Days per week: 7 days    Minutes  per session: 30 min  . Stress: Not at all  Relationships  . Social connections:    Talks on phone: More than three times a week    Gets together: More than three times a week    Attends religious service: 1 to 4 times per year    Active member of club or organization: No    Attends meetings of clubs or organizations: Never    Relationship status: Married  Other Topics Concern  . Not on file  Social History Narrative   10th grade    Family History  Problem Relation Age of Onset  . GER disease Father   . Cancer Father        kidney  . Asthma Brother   .  Miscarriages / [place] Brother   . GER disease Maternal Grandmother   . Hyperlipidemia Maternal Grandmother   . ADD / ADHD Brother   . Miscarriages / [place] Mother   . Headache Mother   . Cancer Maternal Grandfather   . Diabetes Maternal Grandfather   . Hyperlipidemia Maternal Grandfather   . Vision loss Maternal Grandfather   . GER disease Maternal Aunt   . Migraines Maternal Aunt   . Anxiety disorder Maternal Aunt   . Diabetes Paternal Aunt   . Cancer Paternal Uncle        spinal cancer  . Ataxia Neg Hx   . Chorea Neg Hx   . Dementia Neg Hx   . Mental retardation Neg Hx   . Multiple sclerosis Neg Hx   . Neurofibromatosis Neg Hx   . Neuropathy Neg Hx   . Parkinsonism Neg Hx   . Seizures Neg Hx   . Stroke Neg Hx   . Depression Neg Hx   . Bipolar disorder Neg Hx       Blood pressure 126/86, pulse 88, temperature 97.9 F (36.6 C), temperature source Oral, resp. rate 18, height 4\' 10"  (1.473 m), weight 91.6 kg. Exam Physical Exam  Physical Exam  Vitals reviewed. Constitutional: She is oriented to person, place, and time. She appears well-developed and well-nourished.  HENT:  Head: Normocephalic and atraumatic.  Right Ear: External ear normal.  Left Ear: External ear normal.  Nose: Nose normal.  Mouth/Throat: Oropharynx is clear and moist.  Eyes: Conjunctivae and EOM are normal. Pupils are equal,  round, and reactive to light. Right eye exhibits no discharge. Left eye exhibits no discharge. No scleral icterus.  Neck: Normal range of motion. Neck supple. No tracheal deviation present. No thyromegaly present.  Cardiovascular: Normal rate, regular rhythm, normal heart sounds and intact distal pulses.  Exam reveals no gallop and no friction rub.   No murmur heard. Respiratory: Effort normal and breath sounds normal. No respiratory distress. She has no wheezes. She has no rales. She exhibits no tenderness.  GI: Soft. Bowel sounds are normal. She exhibits no distension and no mass. There is tenderness. There is no rebound and no guarding.  Genitourinary:       Vulva is normal without lesions Vagina is pink moist without discharge Cervix normal in appearance and pap is normal Uterus is size>>dates Adnexa is negative with normal sized ovaries by sonogram  Musculoskeletal: Normal range of motion. She exhibits no edema and no tenderness.  Neurological: She is alert and oriented to person, place, and time. She has normal reflexes. She displays normal reflexes. No cranial nerve deficit. She exhibits normal muscle tone. Coordination normal.  Skin: Skin is warm and dry. No rash noted. No erythema. No pallor.  Psychiatric: She has a normal mood and affect. Her behavior is normal. Judgment and thought content normal.   Results for orders placed or performed during the hospital encounter of 08/07/18 (from the past 168 hour(s))  CBC   Collection Time: 08/07/18  1:44 PM  Result Value Ref Range   WBC 10.4 4.0 - 10.5 K/uL   RBC 4.49 3.87 - 5.11 MIL/uL   Hemoglobin 9.9 (L) 12.0 - 15.0 g/dL   HCT 16.133.4 (L) 09.636.0 - 04.546.0 %   MCV 74.4 (L) 80.0 - 100.0 fL   MCH 22.0 (L) 26.0 - 34.0 pg   MCHC 29.6 (L) 30.0 - 36.0 g/dL   RDW 40.916.5 (H) 81.111.5 - 91.415.5 %   Platelets 334 150 -  400 K/uL   nRBC 0.0 0.0 - 0.2 %  Glucose, capillary   Collection Time: 08/07/18  1:47 PM  Result Value Ref Range   Glucose-Capillary 68 (L)  70 - 99 mg/dL  Glucose, capillary   Collection Time: 08/07/18  3:01 PM  Result Value Ref Range   Glucose-Capillary 81 70 - 99 mg/dL  Type and screen [facility]   Collection Time: 08/07/18  3:11 PM  Result Value Ref Range   ABO/RH(D) O POS    Antibody Screen PENDING    Sample Expiration      08/10/2018 Performed at [facility], [address], [place], [place] [address]      Prenatal labs: ABO, Rh: --/--/O POS (04/08 1511) Antibody: PENDING (04/08 1511) Rubella: <0.90 (09/20 1014) RPR: Non Reactive (01/10 0851)  HBsAg: Negative (09/20 1014)  HIV: Non Reactive (01/10 0851)  GBS:     Assessment/Plan: [redacted]w[redacted]d Estimated Date of Delivery: 08/08/18  Class A1 DM with extrapolated EFW 4550 grams from a [redacted]w[redacted]d sonogram, with fetal vertex out of pelvis  Primary Caesarean section   Lazaro Arms 08/07/2018, 3:47 PM

## 2018-08-08 DIAGNOSIS — O9081 Anemia of the puerperium: Secondary | ICD-10-CM | POA: Diagnosis present

## 2018-08-08 LAB — CBC
HCT: 23.2 % — ABNORMAL LOW (ref 36.0–46.0)
Hemoglobin: 7.2 g/dL — ABNORMAL LOW (ref 12.0–15.0)
MCH: 23.3 pg — ABNORMAL LOW (ref 26.0–34.0)
MCHC: 31 g/dL (ref 30.0–36.0)
MCV: 75.1 fL — ABNORMAL LOW (ref 80.0–100.0)
Platelets: 243 10*3/uL (ref 150–400)
RBC: 3.09 MIL/uL — ABNORMAL LOW (ref 3.87–5.11)
RDW: 16.5 % — ABNORMAL HIGH (ref 11.5–15.5)
WBC: 10.1 10*3/uL (ref 4.0–10.5)
nRBC: 0 % (ref 0.0–0.2)

## 2018-08-08 LAB — RPR: RPR Ser Ql: NONREACTIVE

## 2018-08-08 LAB — BIRTH TISSUE RECOVERY COLLECTION (PLACENTA DONATION)

## 2018-08-08 LAB — ABO/RH: ABO/RH(D): O POS

## 2018-08-08 MED ORDER — DOCUSATE SODIUM 100 MG PO CAPS
100.0000 mg | ORAL_CAPSULE | Freq: Two times a day (BID) | ORAL | Status: DC
  Administered 2018-08-08 – 2018-08-10 (×5): 100 mg via ORAL
  Filled 2018-08-08 (×5): qty 1

## 2018-08-08 MED ORDER — FERROUS SULFATE 325 (65 FE) MG PO TABS
325.0000 mg | ORAL_TABLET | Freq: Two times a day (BID) | ORAL | Status: DC
  Administered 2018-08-08 – 2018-08-10 (×4): 325 mg via ORAL
  Filled 2018-08-08 (×4): qty 1

## 2018-08-08 MED ORDER — SODIUM CHLORIDE 0.9 % IV SOLN
510.0000 mg | Freq: Once | INTRAVENOUS | Status: AC
  Administered 2018-08-08: 12:00:00 510 mg via INTRAVENOUS
  Filled 2018-08-08: qty 17

## 2018-08-08 NOTE — Lactation Note (Signed)
This note was copied from a baby's chart. Lactation Consultation Note:  Mother assist with hand expression. She reports that her nipples are sore and she has trouble getting infant to open his mouth wide enough.   Infant placed in cross cradle hold with good pillow support. Mother taught to firm her nipple , hand express and latch infant on with off sided latch. Infant latched well. Father at the bedside and taught to flange infants lips for wider gape.   Infant sustained latch for 25 or more mins. Observed swallows. Mother taught breast compression.   Encouraged to continue to cue base feed . Discussed cluster feeding. Advised to feed infant 8-12 times in 24 hours .   Offered to sat up DEBP for mother any time . She will ask if needed. Informed mother to use good support , rotate positions frequently to prevent soreness.   Mother was given comfort gels.  Mother is aware that she can page [facility], tech or staff nurse for assistance.   Patient Name: Martha Herrera [id number] Date: 08/08/2018 Reason for consult: Follow-up assessment   Maternal Data    Feeding Feeding Type: Breast Fed  LATCH Score Latch: Grasps breast easily, tongue down, lips flanged, rhythmical sucking.  Audible Swallowing: Spontaneous and intermittent  Type of Nipple: Everted at rest and after stimulation  Comfort (Breast/Nipple): Filling, red/small blisters or bruises, mild/mod discomfort  Hold (Positioning): Assistance needed to correctly position infant at breast and maintain latch.  LATCH Score: 8  Interventions Interventions: Assisted with latch;Skin to skin;Hand express;Breast compression;Adjust position;Support pillows;Position options;Expressed milk;Comfort gels  Lactation Tools Discussed/Used     Consult Status Consult Status: Follow-up Date: 08/09/18 Follow-up type: In-patient    Stevan Born [facility] 08/08/2018, 2:04 PM

## 2018-08-08 NOTE — Anesthesia Postprocedure Evaluation (Signed)
Anesthesia Post Note  Patient: Martha Herrera  Procedure(s) Performed: CESAREAN SECTION (N/A )     Patient location during evaluation: Mother Baby Anesthesia Type: Spinal Level of consciousness: awake, awake and alert, oriented and patient cooperative Pain management: pain level controlled Vital Signs Assessment: post-procedure vital signs reviewed and stable Respiratory status: spontaneous breathing, nonlabored ventilation and respiratory function stable Cardiovascular status: stable Postop Assessment: no headache, no backache, no apparent nausea or vomiting and patient able to bend at knees Anesthetic complications: no Comments: Phone interview with patient.    Last Vitals:  Vitals:   08/08/18 0911 08/08/18 1329  BP: (!) 92/56 98/64  Pulse: 69 85  Resp: 18 18  Temp: 36.8 C 36.6 C  SpO2: 98% 96%    Last Pain:  Vitals:   08/08/18 1811  TempSrc:   PainSc: 4    Pain Goal:                   Miakoda Mcmillion L

## 2018-08-08 NOTE — Progress Notes (Signed)
Postpartum Day 1: Cesarean Delivery Subjective: Patient reports incisional pain and tolerating PO.  No flatus yet. Breastfeeding, baby is stable at bedside. Foley has been removed.   Objective: Vital signs in last 24 hours: Temp:  [97.8 F (36.6 C)-98.9 F (37.2 C)] 98.3 F (36.8 C) (04/09 0911) Pulse Rate:  [69-95] 69 (04/09 0911) Resp:  [14-21] 18 (04/09 0911) BP: (90-126)/(46-86) 92/56 (04/09 0911) SpO2:  [96 %-99 %] 98 % (04/09 0911) Weight:  [91.6 kg] 91.6 kg (04/08 1323)  Physical Exam:  General: alert and no distress Lochia: appropriate Uterine Fundus: firm Incision: no significant drainage DVT Evaluation: No evidence of DVT seen on physical exam. Negative Homan's sign. No significant calf/ankle edema.  Recent Labs    08/07/18 1344 08/08/18 0630  HGB 9.9* 7.2*  HCT 33.4* 23.2*    Assessment/Plan: Status post Cesarean section. Doing well postoperatively.  Feraheme ordered for postoperative anemia Encourage OOB, ambulation Oral analgesia as needed Routine postpartum care.  Jaynie Collins, MD 08/08/2018, 10:54 AM

## 2018-08-08 NOTE — Addendum Note (Signed)
Addendum  created 08/08/18 1946 by Yolonda Kida, CRNA   Clinical Note Signed

## 2018-08-09 NOTE — Progress Notes (Signed)
Post Partum/post op Day 2, SP PLTCS for macrosomia/GDM Subjective: no complaints, up ad lib, voiding and tolerating PO, small lochia, plans to breastfeed, IUD . Had BM yesterday and is passing flatus. Objective: Blood pressure 107/71, pulse 79, temperature 97.7 F (36.5 C), temperature source Oral, resp. rate 16, height 4\' 10"  (1.473 m), weight 91.6 kg, SpO2 99 %, unknown if currently breastfeeding.  Physical Exam:  General: alert, cooperative and no distress Lochia:normal flow Chest: CTAB Heart: RRR no m/r/g Abdomen: +BS, soft, nontender, dsg dry/intact Uterine Fundus: firm DVT Evaluation: No evidence of DVT seen on physical exam. Extremities: trace edema  Recent Labs    08/07/18 1344 08/08/18 0630  HGB 9.9* 7.2*  HCT 33.4* 23.2*    Assessment/Plan: Received IV iron yesterday Plan for DC tomorrow   LOS: 2 days   Jacklyn Shell 08/09/2018, 7:50 AM

## 2018-08-09 NOTE — Progress Notes (Signed)
Mom sleeping. Dad requested RN to come back later. Instructed to call with next feed or when mom wakes up.

## 2018-08-10 MED ORDER — MEASLES, MUMPS & RUBELLA VAC IJ SOLR
0.5000 mL | Freq: Once | INTRAMUSCULAR | Status: AC
  Administered 2018-08-10: 0.5 mL via SUBCUTANEOUS
  Filled 2018-08-10: qty 0.5

## 2018-08-10 MED ORDER — OXYCODONE-ACETAMINOPHEN 5-325 MG PO TABS: 1.0000 | ORAL_TABLET | Freq: Four times a day (QID) | ORAL | 0 refills | Status: AC | PRN

## 2018-08-10 MED ORDER — IBUPROFEN 800 MG PO TABS: 800.0000 mg | ORAL_TABLET | Freq: Three times a day (TID) | ORAL | 0 refills | Status: DC | PRN

## 2018-08-10 NOTE — Progress Notes (Signed)
This RT completed an EKG on this patient at 01:10 following order for STAT EKG due to chest pain. EKG was completed and paper copy placed in patient's chart. At 0416, patient's nurse called to say that EKG was not showing in Epic.  At 0520 this RT showed the paper copy EKG to Dr. Marcy Siren, she stated that it looks normal to her, ordered repeat EKG to clarify about sinus arryhthmia.

## 2018-08-10 NOTE — Lactation Note (Signed)
This note was copied from a baby's chart. Lactation Consultation Note  Patient Name: Martha Herrera [id number] Date: 08/10/2018 Reason for consult: Follow-up assessment;Nipple pain/trauma;1st time breastfeeding   P1, Baby 64 hours old.   Mother wearing shells. Mother has vertical bilateral cracks on nipples that appear to be healing.  Mother is using personal nipple cream and alternating with comfort gels. Offered to assist mother w/ breastfeeding but mother declined. Mother states she has not been pumping q 3hours. Discussed supply and demand. Mom knows to pump q3h for 15-20 min. Reviewed engorgement care and monitoring voids/stools. Feed on demand approximately 8-12 times per day.      Maternal Data    Feeding Feeding Type: Formula Nipple Type: Slow - flow  LATCH Score       Type of Nipple: Flat  Comfort (Breast/Nipple): Filling, red/small blisters or bruises, mild/mod discomfort        Interventions Interventions: Comfort gels;Breast compression;Shells  Lactation Tools Discussed/Used Tools: Shells;Pump;Comfort gels Shell Type: Inverted Breast pump type: Double-Electric Breast Pump   Consult Status Consult Status: Follow-up Date: 08/10/18 Follow-up type: In-patient    Martha Herrera [facility] 08/10/2018, 8:46 AM

## 2018-08-10 NOTE — Progress Notes (Signed)
Dr. Earlene Plater contacted to clarify order. States placed another EKG order for uploading purposes. Does not want a repeat EKG at this time

## 2018-08-10 NOTE — Lactation Note (Addendum)
This note was copied from a baby's chart. Lactation Consultation Note LC f/u d/t cracked nipples. FOB holding baby giving formula. Appears baby had taken 1/2 bottle of Gerber. Mom stated her nipples needed to rest d/t cracked and bleeding nipples. Mom wearing comfort gels. Mom has shells but hasn't worn them. Encouraged mom to wear shells. Asked mom to call for [facility] for next feeding to assist in latching to assist in reason for cracked nipples. LC feels that d/t flat nipples, baby probably has shallow latch. Nipples evert well w/stimulation. Mom stated painful to touch. Mom has used DEBP, that is painful as well.  Mom stated she would call for assistance.  Patient Name: Martha Herrera [id number] Date: 08/10/2018 Reason for consult: Follow-up assessment;Nipple pain/trauma;1st time breastfeeding   Maternal Data    Feeding Feeding Type: Formula Nipple Type: Slow - flow  LATCH Score       Type of Nipple: Flat  Comfort (Breast/Nipple): Filling, red/small blisters or bruises, mild/mod discomfort        Interventions Interventions: Comfort gels;Breast compression;Shells  Lactation Tools Discussed/Used Tools: Shells;Pump;Comfort gels Shell Type: Inverted Breast pump type: Double-Electric Breast Pump   Consult Status Consult Status: Follow-up Date: 08/10/18 Follow-up type: In-patient    Charyl Dancer 08/10/2018, 6:25 AM

## 2018-08-10 NOTE — Progress Notes (Signed)
EKG not loading into the computer. [facility] notified. Paper taken to L&D for Earlene Plater to read.

## 2018-08-10 NOTE — Discharge Summary (Signed)
Postpartum Discharge Summary     Patient Name: Martha Herrera DOB: Dec 28, 1995 MRN: [id number]  Date of admission: 08/07/2018 Delivering Provider: Florian Buff   Date of discharge: 08/10/2018  Admitting diagnosis: fetal macrosomia and gdm Intrauterine pregnancy: [redacted]w[redacted]d    Secondary diagnosis:  Active Problems:   S/P cesarean section   Postpartum anemia  Additional problems: None     Discharge diagnosis: Term Pregnancy Delivered, GDM A1, Anemia and pLTCS                                                                                                Post partum procedures:iron infusion   Augmentation: None  Complications: None  Hospital course:  Sceduled C/S   23y.o. yo G1P1001 at 358w6das admitted to the hospital 08/07/2018 for scheduled cesarean section with the following indication:Macrosomia and Elective Primary.  Membrane Rupture Time/Date: 4:21 PM ,08/07/2018   Patient delivered a Viable infant.08/07/2018  Details of operation can be found in separate operative note.  Pateint had an uncomplicated postpartum course.  She is ambulating, tolerating a regular diet, passing flatus, and urinating well. Patient is discharged home in stable condition on  08/10/18         Magnesium Sulfate recieved: No BMZ received: No  Physical exam  Vitals:   08/09/18 1359 08/10/18 0015 08/10/18 0518 08/10/18 0928  BP: 110/81 111/70 120/68   Pulse: 80 73 78   Resp: 18 17 18    Temp: 98.1 F (36.7 C) 99.7 F (37.6 C) 98.5 F (36.9 C)   TempSrc: Oral Oral    SpO2: 100% 98%  99%  Weight:      Height:       General: alert, cooperative and no distress Lochia: appropriate Uterine Fundus: firm Incision: Healing well with no significant drainage DVT Evaluation: No evidence of DVT seen on physical exam. Labs: Lab Results  Component Value Date   WBC 10.1 08/08/2018   HGB 7.2 (L) 08/08/2018   HCT 23.2 (L) 08/08/2018   MCV 75.1 (L) 08/08/2018   PLT 243 08/08/2018   CMP Latest Ref Rng &  Units 07/28/2018  Glucose 70 - 99 mg/dL 94  BUN 6 - 20 mg/dL 11  Creatinine 0.44 - 1.00 mg/dL 0.61  Sodium 135 - 145 mmol/L 134(L)  Potassium 3.5 - 5.1 mmol/L 3.5  Chloride 98 - 111 mmol/L 104  CO2 22 - 32 mmol/L 20(L)  Calcium 8.9 - 10.3 mg/dL 8.9  Total Protein 6.5 - 8.1 g/dL 6.2(L)  Total Bilirubin 0.3 - 1.2 mg/dL 0.4  Alkaline Phos 38 - 126 U/L 257(H)  AST 15 - 41 U/L 21  ALT 0 - 44 U/L 24    Discharge instruction: per After Visit Summary and "Baby and Me Booklet".  After visit meds:  Allergies as of 08/10/2018      Reactions   Penicillins Hives, Itching, Rash   Did it involve swelling of the face/tongue/throat, SOB, or low BP? No Did it involve sudden or severe rash/hives, skin peeling, or any reaction on the inside of your mouth or nose? No Did you  need to seek medical attention at a hospital or doctor's office? No When did it last happen?childhood allergy If all above answers are "NO", may proceed with cephalosporin use.   Amoxil [amoxicillin] Itching, Rash   Did it involve swelling of the face/tongue/throat, SOB, or low BP? No Did it involve sudden or severe rash/hives, skin peeling, or any reaction on the inside of your mouth or nose? No Did you need to seek medical attention at a hospital or doctor's office? No When did it last happen?2019 If all above answers are "NO", may proceed with cephalosporin use.      Medication List    STOP taking these medications   Accu-Chek FastClix Lancets Misc   Accu-Chek Guide w/Device Kit   acetaminophen 500 MG tablet Commonly known as:  TYLENOL   glucose blood test strip Commonly known as:  Accu-Chek Guide     TAKE these medications   fluticasone 50 MCG/ACT nasal spray Commonly known as:  FLONASE Place 1 spray into both nostrils daily.   ibuprofen 800 MG tablet Commonly known as:  ADVIL,MOTRIN Take 1 tablet (800 mg total) by mouth every 8 (eight) hours as needed.   loratadine 10 MG tablet Commonly known  as:  CLARITIN Take 10 mg by mouth daily.   omeprazole 40 MG capsule Commonly known as:  PRILOSEC Take 1 capsule (40 mg total) by mouth daily.   oxyCODONE-acetaminophen 5-325 MG tablet Commonly known as:  Percocet Take 1-2 tablets by mouth every 6 (six) hours as needed for up to 5 days for moderate pain or severe pain.   PRENATAL GUMMIES/DHA & FA PO Take 1 tablet by mouth 2 (two) times daily.       Diet: routine diet  Activity: Advance as tolerated. Pelvic rest for 6 weeks.   Outpatient follow up:2 weeks Follow up Appt: Future Appointments  Date Time Provider [place]  08/13/2018 10:00 AM Eure, Mertie Clause, MD [facility]   Follow up Visit:   Please schedule this patient for Postpartum visit in: 6 weeks with the following provider: Any provider For C/S patients schedule nurse incision check in weeks 2 weeks: yes High risk pregnancy complicated by: GDM Delivery mode:  CS Anticipated Birth Control:  IUD PP Procedures needed: Incision check  Schedule Integrated BH visit: no   Newborn Data: Live born female  Birth Weight: 9 lb 7 oz (4280 g) APGAR: 58, 9  Newborn Delivery   Birth date/time:  08/07/2018 16:22:00 Delivery type:  C-Section, Low Transverse Trial of labor:  No C-section categorization:  Primary     Baby Feeding: Bottle and Breast Disposition:home with mother  Marcille Buffy DNP, CNM  08/10/18  9:52 AM

## 2018-08-10 NOTE — Progress Notes (Addendum)
Patient complains of heaviness in chest, stated "feels like an elephant is sitting on my chest". Patient does not appear in distress, is calm and nothing abnormal heard with auscultating cardiac and respiratory assessment

## 2018-08-12 ENCOUNTER — Telehealth: Payer: Self-pay | Admitting: *Deleted

## 2018-08-12 NOTE — Telephone Encounter (Signed)
mychart message to patient with covid restrictions.

## 2018-08-13 ENCOUNTER — Other Ambulatory Visit: Payer: Self-pay

## 2018-08-13 ENCOUNTER — Ambulatory Visit (INDEPENDENT_AMBULATORY_CARE_PROVIDER_SITE_OTHER): Payer: Medicaid Other | Admitting: Obstetrics & Gynecology

## 2018-08-13 ENCOUNTER — Encounter: Payer: Self-pay | Admitting: Obstetrics & Gynecology

## 2018-08-13 VITALS — BP 136/55 | HR 92 | Ht <= 58 in | Wt 188.0 lb

## 2018-08-13 DIAGNOSIS — Z98891 History of uterine scar from previous surgery: Secondary | ICD-10-CM

## 2018-08-13 NOTE — Progress Notes (Signed)
  HPI: Patient returns for routine postoperative follow-up having undergone primar c section 08/07/2018 on .  The patient's immediate postoperative recovery has been unremarkable. Since hospital discharge the patient reports no problems.   Current Outpatient Medications: fluticasone (FLONASE) 50 MCG/ACT nasal spray, Place 1 spray into both nostrils daily., Disp: , Rfl:  ibuprofen (ADVIL,MOTRIN) 800 MG tablet, Take 1 tablet (800 mg total) by mouth every 8 (eight) hours as needed., Disp: 30 tablet, Rfl: 0 loratadine (CLARITIN) 10 MG tablet, Take 10 mg by mouth daily., Disp: , Rfl:  omeprazole (PRILOSEC) 40 MG capsule, Take 1 capsule (40 mg total) by mouth daily., Disp: 30 capsule, Rfl: 6 Prenatal MV-Min-FA-Omega-3 (PRENATAL GUMMIES/DHA & FA PO), Take 1 tablet by mouth 2 (two) times daily. , Disp: , Rfl:  oxyCODONE-acetaminophen (PERCOCET) 5-325 MG tablet, Take 1-2 tablets by mouth every 6 (six) hours as needed for up to 5 days for moderate pain or severe pain. (Patient not taking: Reported on 08/13/2018), Disp: 20 tablet, Rfl: 0  No current facility-administered medications for this visit.     Blood pressure (!) 136/55, pulse 92, height 4\' 10"  (1.473 m), weight 188 lb (85.3 kg), currently breastfeeding.  Physical Exam: Incision clean dry dry  Diagnostic Tests:   Pathology: benign  Impression: S/p primary C section due to suspected fetal macrosomia in Class A1 DM and non engaged vertex  Plan:   Follow up: 5  weeks  Lazaro Arms, MD

## 2018-09-13 ENCOUNTER — Encounter: Payer: Self-pay | Admitting: *Deleted

## 2018-09-13 ENCOUNTER — Telehealth: Payer: Self-pay | Admitting: *Deleted

## 2018-09-13 NOTE — Telephone Encounter (Signed)
Patient informed that we are not allowing visitors or children to come to appointments at this time. Patient advised to wear a mask to her appointment if she has one. Patient denies any contact with anyone suspected or confirmed of having COVID-19. Pt denies fever, cough, sob, muscle pain, diarrhea, rash, vomiting, abdominal pain, red eye, weakness, bruising or bleeding, joint pain or severe headache.    

## 2018-09-16 ENCOUNTER — Ambulatory Visit (INDEPENDENT_AMBULATORY_CARE_PROVIDER_SITE_OTHER): Payer: Medicaid Other | Admitting: Women's Health

## 2018-09-16 ENCOUNTER — Other Ambulatory Visit: Payer: Self-pay

## 2018-09-16 ENCOUNTER — Encounter: Payer: Self-pay | Admitting: Women's Health

## 2018-09-16 DIAGNOSIS — Z30430 Encounter for insertion of intrauterine contraceptive device: Secondary | ICD-10-CM | POA: Diagnosis not present

## 2018-09-16 DIAGNOSIS — Z3202 Encounter for pregnancy test, result negative: Secondary | ICD-10-CM | POA: Diagnosis not present

## 2018-09-16 DIAGNOSIS — Z8632 Personal history of gestational diabetes: Secondary | ICD-10-CM

## 2018-09-16 DIAGNOSIS — Z98891 History of uterine scar from previous surgery: Secondary | ICD-10-CM

## 2018-09-16 LAB — POCT URINE PREGNANCY: Preg Test, Ur: NEGATIVE

## 2018-09-16 MED ORDER — LEVONORGESTREL 19.5 MCG/DAY IU IUD
INTRAUTERINE_SYSTEM | Freq: Once | INTRAUTERINE | Status: AC
  Administered 2018-09-16: 1 via INTRAUTERINE

## 2018-09-16 NOTE — Patient Instructions (Signed)
You will have your sugar test in 7 weeks.  Please do not eat or drink anything after midnight the night before you come, not even water.  You will be here for at least two hours.     Nothing in vagina for 3 days (no sex, douching, tampons, etc...)  Check your strings once a month to make sure you can feel them, if you are not able to please let us know  If you develop a fever of 100.4 or more in the next few weeks, or if you develop severe abdominal pain, please let [name] know  Use a backup method of birth control, such as condoms, for 2 weeks    Intrauterine Device Insertion, Care After  This sheet gives you information about how to care for yourself after your procedure. Your health care provider may also give you more specific instructions. If you have problems or questions, contact your health care provider. What can I expect after the procedure? After the procedure, it is common to have:  Cramps and pain in the abdomen.  Light bleeding (spotting) or heavier bleeding that is like your menstrual period. This may last for up to a few days.  Lower back pain.  Dizziness.  Headaches.  Nausea. Follow these instructions at home:  Before resuming sexual activity, check to make sure that you can feel the IUD string(s). You should be able to feel the end of the string(s) below the opening of your cervix. If your IUD string is in place, you may resume sexual activity. ? If you had a hormonal IUD inserted more than 7 days after your most recent period started, you will need to use a backup method of birth control for 7 days after IUD insertion. Ask your health care provider whether this applies to you.  Continue to check that the IUD is still in place by feeling for the string(s) after every menstrual period, or once a month.  Take over-the-counter and prescription medicines only as told by your health care provider.  Do not drive or use heavy machinery while taking prescription pain  medicine.  Keep all follow-up visits as told by your health care provider. This is important. Contact a health care provider if:  You have bleeding that is heavier or lasts longer than a normal menstrual cycle.  You have a fever.  You have cramps or abdominal pain that get worse or do not get better with medicine.  You develop abdominal pain that is new or is not in the same area of earlier cramping and pain.  You feel lightheaded or weak.  You have abnormal or bad-smelling discharge from your vagina.  You have pain during sexual activity.  You have any of the following problems with your IUD string(s): ? The string bothers or hurts you or your sexual partner. ? You cannot feel the string. ? The string has gotten longer.  You can feel the IUD in your vagina.  You think you may be pregnant, or you miss your menstrual period.  You think you may have an STI (sexually transmitted infection). Get help right away if:  You have flu-like symptoms.  You have a fever and chills.  You can feel that your IUD has slipped out of place. Summary  After the procedure, it is common to have cramps and pain in the abdomen. It is also common to have light bleeding (spotting) or heavier bleeding that is like your menstrual period.  Continue to check that the  IUD is still in place by feeling for the string(s) after every menstrual period, or once a month.  Keep all follow-up visits as told by your health care provider. This is important.  Contact your health care provider if you have problems with your IUD string(s), such as the string getting longer or bothering you or your sexual partner. This information is not intended to replace advice given to you by your health care provider. Make sure you discuss any questions you have with your health care provider. Document Released: 12/14/2010 Document Revised: 03/08/2016 Document Reviewed: 03/08/2016 Elsevier Interactive Patient Education  2019  [employer].

## 2018-09-16 NOTE — Progress Notes (Signed)
POSTPARTUM VISIT Patient name: Martha Herrera Pedigo MRN [id number]  Date of birth: 04/02/1996 Chief Complaint:   Postpartum Care (iud insertion)  History of Present Illness:   Martha Herrera [place] is a 23 y.o. 921P1001 Caucasian female being seen today for a postpartum visit. She is 5 weeks postpartum following a primary cesarean section, low transverse incision at 39.6 gestational weeks for A1DM w/ suspected fetal macrosomia. Baby weighed 9lb7oz/4280g, Anesthesia: spinal. Laceration: n/a. I have fully reviewed the prenatal and intrapartum course. Pregnancy complicated by A1DM. Postpartum course has been uncomplicated. Bleeding light bleeding started this am. Bowel function is normal. Bladder function is normal.  Patient is not sexually active. Last sexual activity: prior to birth of baby.  Contraception method is wants IUD today.   The risks and benefits of the method and placement have been thouroughly reviewed with the patient and all questions were answered.  Specifically the patient is aware of failure rate of 05/998, expulsion of the IUD and of possible perforation.  The patient is aware of irregular bleeding due to the method and understands the incidence of irregular bleeding diminishes with time.  Signed copy of informed consent in chart.    Last pap 11/08/16.  Results were normal .  Patient's last menstrual period was 09/15/2018 (exact date).  Baby's course has been complicated by scrotal hernia. Baby is feeding by breast x 2wks, now bottle.   Edinburgh Postpartum Depression Screening: negative Edinburgh Postnatal Depression Scale - 09/16/18 0903      Edinburgh Postnatal Depression Scale:  In the Past 7 Days   I have been able to laugh and see the funny side of things.  0    I have looked forward with enjoyment to things.  0    I have blamed myself unnecessarily when things went wrong.  0    I have been anxious or worried for no good reason.  0    I have felt scared or panicky for no good  reason.  0    Things have been getting on top of me.  0    I have been so unhappy that I have had difficulty sleeping.  0    I have felt sad or miserable.  0    I have been so unhappy that I have been crying.  0    The thought of harming myself has occurred to me.  0    Edinburgh Postnatal Depression Scale Total  0      Review of Systems:   Pertinent items are noted in HPI Denies Abnormal vaginal discharge w/ itching/odor/irritation, headaches, visual changes, shortness of breath, chest pain, abdominal pain, severe nausea/vomiting, or problems with urination or bowel movements. Pertinent History Reviewed:  Reviewed past medical,surgical, obstetrical and family history.  Reviewed problem list, medications and allergies. OB History  Gravida Para Term Preterm AB Living  1 1 1  0 0 1  SAB TAB Ectopic Multiple Live Births  0 0 0 0 1    # Outcome Date GA Lbr Len/2nd Weight Sex Delivery Anes PTL Lv  1 Term 08/07/18 328w6d  9 lb 7 oz (4.28 kg) M CS-LTranv Spinal  LIV   Physical Assessment:   Vitals:   09/16/18 0904  BP: 127/88  Pulse: 89  Weight: 185 lb (83.9 kg)  Height: 4\' 10"  (1.473 m)  Body mass index is 38.67 kg/m.       Physical Examination:   General appearance: alert, well appearing, and in no distress  Mental  status: alert, oriented to person, place, and time  Skin: warm & dry   Cardiovascular: normal heart rate noted   Respiratory: normal respiratory effort, no distress   Breasts: deferred, no complaints   Abdomen: soft, non-tender   Pelvic: VULVA: normal appearing vulva with no masses, tenderness or lesions, VAGINA: normal appearing vagina with normal color and discharge, no lesions, CERVIX: normal appearing cervix without discharge or lesions  Rectal: no hemorrhoids  Extremities: no edema       Results for orders placed or performed in visit on 09/16/18 (from the past 24 hour(s))  POCT urine pregnancy   Collection Time: 09/16/18  9:12 AM  Result Value Ref Range    Preg Test, Ur Negative Negative    IUD INSERTION  Time out was performed.  A graves speculum was placed in the vagina.  The cervix was visualized, prepped using Betadine, and grasped with a single tooth tenaculum. The uterus was found to be neutral and it sounded to 9 cm.  Liletta IUD placed per manufacturer's recommendations. The strings were trimmed to approximately 3 cm. The patient tolerated the procedure well.   Informal transvaginal sonogram was performed and the proper placement of the IUD was verified by myself and [facility], incidental finding of 2.6cm Rt ovarian cyst, ovulatory per [facility]  Assessment & Plan:  1) Postpartum exam 2) 5 wks s/p PLTCS for A1DM w/ fetal macrosomia 3) bottlefeeding 4) Depression screening 5) A1DM during pregnancy> 2hr GTT @ 12wks pp 6) Contraception counseling, pt prefers IUD 7) Liletta IUD insertion The patient was given post procedure instructions, including signs and symptoms of infection and to check for the strings after each menses or each month, and refraining from intercourse or anything in the vagina for 3 days. She was given a Liletta care card with date IUD placed, and date IUD to be removed.  Meds: No orders of the defined types were placed in this encounter.   Return in about 4 weeks (around 10/14/2018) for F/U webex; then 7wks sugar test (no visit).  Cheral Marker CNM, [facility] 09/16/2018 9:45 AM

## 2018-09-16 NOTE — Addendum Note (Signed)
Addended by: Sherre Lain A on: 09/16/2018 01:18 PM   Modules accepted: Orders

## 2018-09-20 ENCOUNTER — Telehealth: Payer: Self-pay | Admitting: Obstetrics & Gynecology

## 2018-09-20 ENCOUNTER — Telehealth: Payer: Self-pay | Admitting: *Deleted

## 2018-09-20 MED ORDER — MEGESTROL ACETATE 40 MG PO TABS: ORAL_TABLET | ORAL | 3 refills | Status: DC

## 2018-09-20 NOTE — Telephone Encounter (Signed)
Rx: megestrol sent in

## 2018-09-20 NOTE — Telephone Encounter (Signed)
Pt had liletta inserted Monday. She started bleeding heavy yesterday, saturating 2 pads per hour since last night. Also reports cramping and quarter sized clots. Discussed with Dr. Despina Hidden and he was present for u/s after insertion and noted proper position. He will send in rx for Megestrol.

## 2018-09-24 ENCOUNTER — Telehealth: Payer: Self-pay | Admitting: *Deleted

## 2018-09-24 NOTE — Telephone Encounter (Signed)
Pt states that the bleeding has lightened up since starting the megace. She wanted to know if she needs to keep taking the entire bottle or stop when she stops bleeding? Also reports incisional pain when she urinates. Her bladder doesn't hurt and it doesn't burn when urinating, pain is just a csection. Advised I would ask provider and let her know what they think.

## 2018-09-24 NOTE — Telephone Encounter (Signed)
Pt would like for  You to call her back she spoke with you last week and you had told her to call back if no change

## 2018-09-25 NOTE — Telephone Encounter (Signed)
No take the entire bottle as prescribed  Nothing to do regarding the incisional type pain at this point, we will see how it progresses clinically as we stop the bleeding

## 2018-09-30 ENCOUNTER — Encounter: Payer: Self-pay | Admitting: *Deleted

## 2018-10-01 ENCOUNTER — Other Ambulatory Visit: Payer: Self-pay

## 2018-10-01 ENCOUNTER — Ambulatory Visit (INDEPENDENT_AMBULATORY_CARE_PROVIDER_SITE_OTHER): Payer: Medicaid Other | Admitting: Women's Health

## 2018-10-01 ENCOUNTER — Encounter: Payer: Self-pay | Admitting: Women's Health

## 2018-10-01 VITALS — BP 117/80 | HR 79 | Temp 98.4°F | Ht 58.5 in | Wt 181.5 lb

## 2018-10-01 DIAGNOSIS — Z30432 Encounter for removal of intrauterine contraceptive device: Secondary | ICD-10-CM

## 2018-10-01 DIAGNOSIS — Z3202 Encounter for pregnancy test, result negative: Secondary | ICD-10-CM

## 2018-10-01 LAB — POCT URINE PREGNANCY: Preg Test, Ur: NEGATIVE

## 2018-10-01 NOTE — Progress Notes (Signed)
   IUD REMOVAL  Patient name: Martha Herrera MRN [id number]  Date of birth: [birth year] Subjective Findings:   Martha Herrera is a 23 y.o. G42P1001 Caucasian female being seen today for removal of a Liletta IUD. Her IUD was placed 09/16/18.  She desires removal because it is causing 'incisional pain' at her c/s incision site. Signed copy of informed consent in chart.   Patient's last menstrual period was 09/15/2018 (exact date). Last pap7/11/18. Results were:  normal The planned method of family planning is discussed all options, wants to think about it and let us know Pertinent History Reviewed:   Reviewed past medical,surgical, social, obstetrical and family history.  Reviewed problem list, medications and allergies. Objective Findings & Procedure:    Vitals:   10/01/18 1153  BP: 117/80  Pulse: 79  Temp: 98.4 F (36.9 C)  Weight: 181 lb 8 oz (82.3 kg)  Height: 4' 10.5" (1.486 m)  Body mass index is 37.29 kg/m.  Results for orders placed or performed in visit on 10/01/18 (from the past 24 hour(s))  POCT urine pregnancy   Collection Time: 10/01/18 11:59 AM  Result Value Ref Range   Preg Test, Ur Negative Negative     Time out was performed.  A graves speculum was placed in the vagina.  The cervix was visualized, and the strings were visible. They were grasped and the Liletta IUD was easily removed intact without complications. The patient tolerated the procedure well.  Assessment & Plan:   1) Liletta IUD removal Follow-up prn problems  2) Contraception counseling> will let us know what she decides, abstinence until then  Orders Placed This Encounter  Procedures  . POCT urine pregnancy    Follow-up: Return for cancel 6/15 appt.  Cheral Marker CNM, [facility] 10/01/2018 12:15 PM

## 2018-10-14 ENCOUNTER — Ambulatory Visit: Payer: Medicaid Other | Admitting: Women's Health

## 2018-10-31 ENCOUNTER — Telehealth: Payer: Self-pay | Admitting: Women's Health

## 2018-10-31 NOTE — Telephone Encounter (Signed)
Patient called, stated that she is in pain on her right side all the time and when she urinates x 2 days.  Last night started going to her incision area and pelvic area.  She stated that she is not pregnant.  Only feels relief when she stands.  She is requesting to be seen today.  Please advise.  [phone]

## 2018-11-04 ENCOUNTER — Ambulatory Visit: Payer: Medicaid Other | Admitting: Women's Health

## 2018-11-04 ENCOUNTER — Other Ambulatory Visit: Payer: Medicaid Other

## 2018-11-05 ENCOUNTER — Ambulatory Visit: Payer: Medicaid Other | Admitting: Women's Health

## 2018-11-12 ENCOUNTER — Ambulatory Visit: Payer: Medicaid Other | Admitting: Women's Health

## 2018-11-12 ENCOUNTER — Other Ambulatory Visit: Payer: Medicaid Other

## 2018-11-14 ENCOUNTER — Other Ambulatory Visit: Payer: Medicaid Other

## 2018-11-14 ENCOUNTER — Ambulatory Visit (INDEPENDENT_AMBULATORY_CARE_PROVIDER_SITE_OTHER): Payer: Medicaid Other | Admitting: Advanced Practice Midwife

## 2018-11-14 ENCOUNTER — Encounter: Payer: Self-pay | Admitting: Advanced Practice Midwife

## 2018-11-14 ENCOUNTER — Other Ambulatory Visit: Payer: Self-pay

## 2018-11-14 VITALS — BP 124/77 | HR 79 | Wt 184.0 lb

## 2018-11-14 DIAGNOSIS — Z8632 Personal history of gestational diabetes: Secondary | ICD-10-CM

## 2018-11-14 DIAGNOSIS — L7682 Other postprocedural complications of skin and subcutaneous tissue: Secondary | ICD-10-CM

## 2018-11-14 DIAGNOSIS — Z3202 Encounter for pregnancy test, result negative: Secondary | ICD-10-CM

## 2018-11-14 LAB — POCT URINE PREGNANCY: Preg Test, Ur: NEGATIVE

## 2018-11-14 NOTE — Progress Notes (Signed)
[facility] Visit  Patient name: Martha Herrera MRN [id number]  Date of birth: 03/28/1996  CC & HPI:  Martha Herrera is a 23 y.o.  female presenting today for lower abdominal pain. Had a CS 08/07/18, had and IUD placed in may and removed <1 month later d/t "incisional pain".  Removing the IUD didn't help w/pain. Still has it along the left side of her CS scar. Aggravated with anything that "stretches" her skin, such as lying down or twisting.  Had GDM and is doing a challenge test today. [facility] but is still "doing research".   Pertinent History Reviewed:  Medical & Surgical Hx:   Past Medical History:  Diagnosis Date  . Complication of anesthesia    hard to wake up  . Gastroesophageal reflux    takes prescribed meds daily  . Gestational diabetes   . Headache(784.0)    not as often  . Heart murmur   . Pneumonia 2013  . Varicella    Past Surgical History:  Procedure Laterality Date  . CESAREAN SECTION N/A 08/07/2018   Procedure: CESAREAN SECTION;  Surgeon: Lazaro ArmsEure, Luther H, MD;  Location: [facility] ORS;  Service: Obstetrics;  Laterality: N/A;  . ESOPHAGOGASTRODUODENOSCOPY  05/03/2012   Procedure: ESOPHAGOGASTRODUODENOSCOPY (EGD);  Surgeon: Jon GillsJoseph H Clark, MD;  [facility];  Service: Gastroenterology;  Laterality: N/A;  . tubes in ears    . WISDOM TOOTH EXTRACTION     Family History  Problem Relation Age of Onset  . GER disease Father   . Cancer Father        kidney  . Asthma Brother   . Miscarriages / [place] Brother   . GER disease Maternal Grandmother   . Hyperlipidemia Maternal Grandmother   . ADD / ADHD Brother   . Miscarriages / [place] Mother   . Headache Mother   . Cancer Maternal Grandfather   . Diabetes Maternal Grandfather   . Hyperlipidemia Maternal Grandfather   . Vision loss Maternal Grandfather   . GER disease Maternal Aunt   . Migraines Maternal Aunt   . Anxiety disorder Maternal Aunt   . Diabetes Paternal Aunt    . Cancer Paternal Uncle        spinal cancer  . Hernia Son   . Ataxia Neg Hx   . Chorea Neg Hx   . Dementia Neg Hx   . Mental retardation Neg Hx   . Multiple sclerosis Neg Hx   . Neurofibromatosis Neg Hx   . Neuropathy Neg Hx   . Parkinsonism Neg Hx   . Seizures Neg Hx   . Stroke Neg Hx   . Depression Neg Hx   . Bipolar disorder Neg Hx     Current Outpatient Medications:  .  fluticasone (FLONASE) 50 MCG/ACT nasal spray, Place 1 spray into both nostrils daily., Disp: , Rfl:  .  loratadine (CLARITIN) 10 MG tablet, Take 10 mg by mouth daily., Disp: , Rfl:  .  omeprazole (PRILOSEC) 40 MG capsule, Take 1 capsule (40 mg total) by mouth daily., Disp: 30 capsule, Rfl: 6 Social History: Reviewed -  reports that she has never smoked. She has never used smokeless tobacco.  Review of Systems:   Constitutional: Negative for fever and chills Eyes: Negative for visual disturbances Respiratory: Negative for shortness of breath, dyspnea Cardiovascular: Negative for chest pain or palpitations  Gastrointestinal: Negative for vomiting, diarrhea and constipation; Genitourinary: Negative for dysuria and  urgency, vaginal irritation or itching Musculoskeletal: Negative for back pain, joint pain, myalgias  Neurological: Negative for dizziness and headaches    Objective Findings:    Physical Examination: Vitals:   11/14/18 0847  BP: 124/77  Pulse: 79   General appearance - well appearing, and in no distress Mental status - alert, oriented to person, place, and time Chest:  Normal respiratory effort Heart - normal rate and regular rhythm Abdomen:  Soft, not tender to palpation.  Pelvic: right side nontender . Bimanual along middle to left side of incision illicits the pain all along incision line from middle to left, right side fine Musculoskeletal:  Normal range of motion without pain Extremities:  No edema    Results for orders placed or performed in visit on 11/14/18 (from the past  24 hour(s))  POCT urine pregnancy   Collection Time: 11/14/18  8:53 AM  Result Value Ref Range   Preg Test, Ur Negative Negative      Assessment & Plan:  A:   Seemingly incisional pain, ? Scar tissue? Vs MSK 3 months s/p CS P:  Offered lidocaine injection, declined.  Lidocaine 5% patch applied  May use OTC if helpful.    No follow-ups on file.  Christin Fudge CNM 11/14/2018 8:57 AM

## 2018-11-15 LAB — GLUCOSE TOLERANCE, 2 HOURS W/ 1HR
Glucose, 1 hour: 104 mg/dL (ref 65–179)
Glucose, 2 hour: 65 mg/dL (ref 65–152)
Glucose, Fasting: 87 mg/dL (ref 65–91)

## 2018-11-29 ENCOUNTER — Encounter: Payer: Self-pay | Admitting: Advanced Practice Midwife

## 2019-01-28 ENCOUNTER — Encounter: Payer: Self-pay | Admitting: Physician Assistant

## 2019-02-25 ENCOUNTER — Encounter: Payer: Self-pay | Admitting: Physician Assistant

## 2019-02-25 ENCOUNTER — Telehealth: Payer: Self-pay | Admitting: Physician Assistant

## 2019-02-25 NOTE — Telephone Encounter (Signed)
Patient returned call and appointment was scheduled.

## 2019-02-25 NOTE — Telephone Encounter (Signed)
Please call and offer virtual or telephone visit for post partum depression. She may either schedule with [place] or see her OBGYN.

## 2019-02-25 NOTE — Telephone Encounter (Signed)
LVMTRC 

## 2019-02-26 ENCOUNTER — Telehealth (INDEPENDENT_AMBULATORY_CARE_PROVIDER_SITE_OTHER): Payer: Medicaid Other | Admitting: Physician Assistant

## 2019-02-26 DIAGNOSIS — F329 Major depressive disorder, single episode, unspecified: Secondary | ICD-10-CM

## 2019-02-26 DIAGNOSIS — F32A Depression, unspecified: Secondary | ICD-10-CM

## 2019-02-26 DIAGNOSIS — F419 Anxiety disorder, unspecified: Secondary | ICD-10-CM | POA: Diagnosis not present

## 2019-02-26 MED ORDER — SERTRALINE HCL 50 MG PO TABS: 50.0000 mg | ORAL_TABLET | Freq: Every day | ORAL | 0 refills | Status: DC

## 2019-02-26 NOTE — Progress Notes (Signed)
Patient: Martha Herrera Female    DOB: 12/12/1995   23 y.o.   MRN: [id number] Visit Date: 02/26/2019  Today's Provider: Trinna Post, PA-C   Chief Complaint  Patient presents with  . PostPartum Depression   Subjective:    I, Martha Herrera CMA, am acting as a Education administrator for [employer].   Virtual Visit via Video Note  I connected with Martha Herrera on 02/26/19 at  9:00 AM EDT by a video enabled telemedicine application and verified that I am speaking with the correct person using two identifiers.  Location: Patient: Home Provider: Office   I discussed the limitations of evaluation and management by telemedicine and the availability of in person appointments. The patient expressed understanding and agreed to proceed.  HPI    Post Partum Depression Patient presents today via virtual visit for postpartum depression. Patient states that she had her baby 6 months ago. Patient states that she has been easy irritated lately. Patient states that she is very stressful and feels like her family could do better without her been around. 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 Reports romantic relationship has declined. Reports she is normally open and bubbly but has become more withdrawn. Reports when she was screened for depression and anxiety previously with [facility] in [place].   Reports while being pregnant she had anxiety while pregnant but has never been anxiety and depressed. She is not currently breast feeding.   Had IUD in 09/2018 that was removed in 11/14/2018. She had been treated with megace after IUD insertion for heavy bleeding. She is having some spotting currently.   Depression screen [facility] 2/9 02/26/2019 07/12/2018 05/23/2018 01/18/2018 11/08/2016  Decreased Interest 3 1 0 0 1  Down, Depressed, Hopeless 2 0 0 0 0  PHQ - 2 Score 5 1 0 0 1  Altered  sleeping 3 - - 1 -  Tired, decreased energy 3 - - 3 -  Change in appetite 1 - - 0 -  Feeling bad or failure about yourself  3 - - 0 -  Trouble concentrating 0 - - 0 -  Moving slowly or fidgety/restless 2 - - 0 -  Suicidal thoughts 0 - - 0 -  PHQ-9 Score 17 - - 4 -  Difficult doing work/chores Very difficult - - - -   GAD 7 : Generalized Anxiety Score 02/26/2019  Nervous, Anxious, on Edge 2  Control/stop worrying 2  Worry too much - different things 2  Trouble relaxing 3  Restless 1  Easily annoyed or irritable 3  Afraid - awful might happen 0  Total GAD 7 Score 13  Anxiety Difficulty Very difficult        Allergies  Allergen Reactions  . Penicillins Hives, Itching and Rash    Did it involve swelling of the face/tongue/throat, SOB, or low BP? No Did it involve sudden or severe rash/hives, skin peeling, or any reaction on the inside of your mouth or nose? No Did you need to seek medical attention at a hospital or doctor's office? No When did it last happen?childhood allergy If all above answers are "NO", may proceed with cephalosporin use.   Marland Kitchen Amoxil [Amoxicillin] Itching and Rash    Did it involve swelling of the face/tongue/throat, SOB, or low BP? No Did it involve sudden or severe rash/hives, skin peeling, or  any reaction on the inside of your mouth or nose? No Did you need to seek medical attention at a hospital or doctor's office? No When did it last happen?2019 If all above answers are "NO", may proceed with cephalosporin use.     Current Outpatient Medications:  .  fluticasone (FLONASE) 50 MCG/ACT nasal spray, Place 1 spray into both nostrils daily., Disp: , Rfl:  .  loratadine (CLARITIN) 10 MG tablet, Take 10 mg by mouth daily., Disp: , Rfl:  .  omeprazole (PRILOSEC) 40 MG capsule, Take 1 capsule (40 mg total) by mouth daily., Disp: 30 capsule, Rfl: 6 .  sertraline (ZOLOFT) 50 MG tablet, Take 1 tablet (50 mg total) by mouth daily., Disp: 90 tablet,  Rfl: 0  Review of Systems  Constitutional: Negative.   Respiratory: Negative.   Genitourinary: Negative.   Neurological: Negative.   Psychiatric/Behavioral: Positive for agitation.    Social History   Tobacco Use  . Smoking status: Never Smoker  . Smokeless tobacco: Never Used  Substance Use Topics  . Alcohol use: No      Objective:   There were no vitals taken for this visit. There were no vitals filed for this visit.There is no height or weight on file to calculate BMI.   Physical Exam Constitutional:      Appearance: Normal appearance.  Neurological:     Mental Status: She is alert.  Psychiatric:        Mood and Affect: Mood normal.        Behavior: Behavior normal.      No results found for any visits on 02/26/19.     Assessment & Plan  1. Anxiety and depression  We will start zoloft. Counseled side effects, time to effect. F/u 6 weeks virtually or telephone. Refer to counseling. Counseled that I will write 1-2 days off work if necessary but I do not think she should be out of work for 1-2 weeks.   - Ambulatory referral to Chronic Care Management Services - sertraline (ZOLOFT) 50 MG tablet; Take 1 tablet (50 mg total) by mouth daily.  Dispense: 90 tablet; Refill: 0  I discussed the assessment and treatment plan with the patient. The patient was provided an opportunity to ask questions and all were answered. The patient agreed with the plan and demonstrated an understanding of the instructions.   The patient was advised to call back or seek an in-person evaluation if the symptoms worsen or if the condition fails to improve as anticipated.  I provided 25 minutes of non-face-to-face time during this encounter.  The entirety of the information documented in the History of Present Illness, Review of Systems and Physical Exam were personally obtained by me. Portions of this information were initially documented by [facility] Herrera, CMA and reviewed by me for  thoroughness and accuracy.         Trey Sailors, PA-C  [facility]

## 2019-03-03 ENCOUNTER — Encounter: Payer: Self-pay | Admitting: Physician Assistant

## 2019-03-03 ENCOUNTER — Telehealth: Payer: Self-pay | Admitting: Physician Assistant

## 2019-03-03 NOTE — Telephone Encounter (Signed)
Work note sent through MyChart.

## 2019-03-10 ENCOUNTER — Ambulatory Visit: Payer: Self-pay | Admitting: *Deleted

## 2019-03-10 NOTE — Chronic Care Management (AMB) (Signed)
  Chronic Care Management   Social Work Note  03/10/2019 Name: Martha Herrera MRN: [id number] DOB: 1996/02/02  Martha Herrera is a 23 y.o. year old female who sees Martha Herrera, [place] for primary care. The CCM team was consulted for assistance with Mental Health Counseling and Resources.   This Education officer, museum reviewed patient's chart, contacted patient by phone. Per patient, the best days for an appointment are on Wednesdays and Fridays. Initial appointment scheduled for 03/14/19.  Outpatient Encounter Medications as of 03/10/2019  Medication Sig  . fluticasone (FLONASE) 50 MCG/ACT nasal spray Place 1 spray into both nostrils daily.  Marland Kitchen loratadine (CLARITIN) 10 MG tablet Take 10 mg by mouth daily.  Marland Kitchen omeprazole (PRILOSEC) 40 MG capsule Take 1 capsule (40 mg total) by mouth daily.  . sertraline (ZOLOFT) 50 MG tablet Take 1 tablet (50 mg total) by mouth daily.   No facility-administered encounter medications on file as of 03/10/2019.     Goals Addressed   None     Follow Up Plan: Appointment scheduled for SW follow up with client by phone on: 03/14/19   Martha Herrera, Martha Herrera  [facility] Care Management [phone]

## 2019-03-14 ENCOUNTER — Ambulatory Visit: Payer: Self-pay | Admitting: *Deleted

## 2019-03-14 ENCOUNTER — Encounter: Payer: Self-pay | Admitting: *Deleted

## 2019-03-14 ENCOUNTER — Telehealth: Payer: Self-pay | Admitting: *Deleted

## 2019-03-14 NOTE — Chronic Care Management (AMB) (Deleted)
  Chronic Care Management   Social Work Note  03/14/2019 Name: Martha Herrera MRN: [id number] DOB: [birth year]  Martha Herrera is a 23 y.o. year old female who sees Martha Herrera, [place] for primary care. The CCM team was consulted for assistance with Mental Health Counseling and Resources.   Phone call to patient for scheduled appointment. Patient requested that the appointment be rescheduled due to the fact that her son was ill and she needed to care for him. Appointment rescheduled for 04/18/19.  Outpatient Encounter Medications as of 03/14/2019  Medication Sig  . fluticasone (FLONASE) 50 MCG/ACT nasal spray Place 1 spray into both nostrils daily.  Marland Kitchen loratadine (CLARITIN) 10 MG tablet Take 10 mg by mouth daily.  Marland Kitchen omeprazole (PRILOSEC) 40 MG capsule Take 1 capsule (40 mg total) by mouth daily.  . sertraline (ZOLOFT) 50 MG tablet Take 1 tablet (50 mg total) by mouth daily.   No facility-administered encounter medications on file as of 03/14/2019.     Goals Addressed   None     Follow Up Plan: Appointment scheduled for SW follow up with client by phone on: 04/18/19   Martha Herrera, [place] Worker  [name] [phone]

## 2019-03-14 NOTE — Chronic Care Management (AMB) (Signed)
  Chronic Care Management   Social Work Note  03/14/2019 Name: Martha Herrera MRN: [id number] DOB: 31-Dec-1995  Martha Herrera is a 23 y.o. year old female who sees Trinna Post, [place] for primary care. The CCM team was consulted for assistance with Mental Health Counseling and Resources.     Phone call to patient for scheduled appointment. Patient asked to re-schedule appointment due to her son being ill. Appointment rescheduled for 04/18/19.  Outpatient Encounter Medications as of 03/14/2019  Medication Sig  . fluticasone (FLONASE) 50 MCG/ACT nasal spray Place 1 spray into both nostrils daily.  Marland Kitchen loratadine (CLARITIN) 10 MG tablet Take 10 mg by mouth daily.  Marland Kitchen omeprazole (PRILOSEC) 40 MG capsule Take 1 capsule (40 mg total) by mouth daily.  . sertraline (ZOLOFT) 50 MG tablet Take 1 tablet (50 mg total) by mouth daily.   No facility-administered encounter medications on file as of 03/14/2019.     Goals Addressed   None     Follow Up Plan: Appointment scheduled for SW follow up with client by phone on: 04/18/19   Elliot Gurney, [place] Worker  [facility] Care Management [phone]

## 2019-03-14 NOTE — Progress Notes (Signed)
This encounter was created in error - please disregard.

## 2019-03-18 ENCOUNTER — Other Ambulatory Visit: Payer: Self-pay | Admitting: *Deleted

## 2019-03-18 DIAGNOSIS — Z20822 Contact with and (suspected) exposure to covid-19: Secondary | ICD-10-CM

## 2019-03-18 DIAGNOSIS — Z20828 Contact with and (suspected) exposure to other viral communicable diseases: Secondary | ICD-10-CM | POA: Diagnosis not present

## 2019-03-19 ENCOUNTER — Ambulatory Visit: Payer: Self-pay | Admitting: *Deleted

## 2019-03-19 ENCOUNTER — Encounter: Payer: Self-pay | Admitting: *Deleted

## 2019-03-19 DIAGNOSIS — F329 Major depressive disorder, single episode, unspecified: Secondary | ICD-10-CM

## 2019-03-19 DIAGNOSIS — F419 Anxiety disorder, unspecified: Secondary | ICD-10-CM

## 2019-03-20 ENCOUNTER — Telehealth: Payer: Self-pay

## 2019-03-20 LAB — NOVEL CORONAVIRUS, NAA: SARS-CoV-2, NAA: DETECTED — AB

## 2019-03-20 NOTE — Chronic Care Management (AMB) (Signed)
Care Management    Clinical Social Work General Note  03/20/2019 Name: Martha Herrera MRN: [id number] DOB: 1996-02-03  Martha Herrera is a 23 y.o. year old female who is a primary care patient of Trinna Post, [place]. The CCM was consulted to assist the patient with Mental Health Counseling and Resources.   Martha Herrera was given information about Care Management services today including:  1. CM service includes personalized support from designated clinical staff supervised by her physician, including individualized plan of care and coordination with other care providers 2. 24/7 contact phone numbers for assistance for urgent and routine care needs. 3. The patient may stop CCM services at any time (effective at the end of the month) by phone call to the office staff.  Patient agreed to services and verbal consent obtained.   Review of patient status, including review of consultants reports, relevant laboratory and other test results, and collaboration with appropriate care team members and the patient's provider was performed as part of comprehensive patient evaluation and provision of chronic care management services.    SDOH (Social Determinants of Health) screening performed today. See Care Plan Entry related to challenges with: Social Connections slightly isolated  Patient disclosed increased sadness and depression about 1 month ago following the birth of her son. Patient states that she had not energy to do anything, her sex drive and self esteem had gone down as well.  Patient currently states that she is doing much better.  Edinburgh depression screen completed today and she scored a 4. Score of 4  "indicates some symptoms of distress that may be short lived and are not likely to interfere with day to day ability to function at home or at work."  This Education officer, museum educated patient on postpartum depression and  provided positive reinforcement for coping strategies used  Including talking  with her doctor,  increasing her self care, journaling and opening up to her husband who is described as her biggest support.    Outpatient Encounter Medications as of 03/19/2019  Medication Sig  . fluticasone (FLONASE) 50 MCG/ACT nasal spray Place 1 spray into both nostrils daily.  Marland Kitchen loratadine (CLARITIN) 10 MG tablet Take 10 mg by mouth daily.  Marland Kitchen omeprazole (PRILOSEC) 40 MG capsule Take 1 capsule (40 mg total) by mouth daily.  . sertraline (ZOLOFT) 50 MG tablet Take 1 tablet (50 mg total) by mouth daily.   No facility-administered encounter medications on file as of 03/19/2019.     Goals Addressed   None      Follow Up Plan: SW will follow up with patient by phone over the next 30 days to complete Edinburgh scale again and to further asses for CM needs        Elliot Gurney, [place] Worker  [facility] Care Management [phone]

## 2019-03-20 NOTE — Telephone Encounter (Signed)
Noted, She can follow up if she needs a visit or medical attention.

## 2019-03-20 NOTE — Telephone Encounter (Signed)
Patient was advised.  

## 2019-03-20 NOTE — Telephone Encounter (Signed)
Copied from [place] [phone]. Topic: General - Inquiry >> Mar 20, 2019 11:05 AM Mathis Bud wrote: Reason for CRM: Patient tested positive covid , patient wanted PCP to know. Call back [phone]

## 2019-03-20 NOTE — Patient Instructions (Signed)
Thank you allowing the Chronic Care Management Team to be a part of your care! It was a pleasure speaking with you today!   Ms. Kobus was given information about Care Management services today including:  1. CM service includes personalized support from designated clinical staff supervised by her physician, including individualized plan of care and coordination with other care providers 2. 24/7 contact phone numbers for assistance for urgent and routine care needs. 3. The patient may stop CCM services at any time (effective at the end of the month) by phone call to the office staff.  CCM (Chronic Care Management) Team   Neldon Labella RN, BSN Nurse Care Coordinator  [phone]  Ruben Reason PharmD  Clinical Pharmacist  [phone]   [place], LCSW Clinical Social Worker [phone]  Goals Addressed   None      The patient verbalized understanding of instructions provided today and declined a print copy of patient instruction materials.   Telephone follow up appointment with care management team member scheduled for:04/02/19

## 2019-04-02 ENCOUNTER — Ambulatory Visit: Payer: Self-pay | Admitting: *Deleted

## 2019-04-02 DIAGNOSIS — F32A Depression, unspecified: Secondary | ICD-10-CM

## 2019-04-02 DIAGNOSIS — F329 Major depressive disorder, single episode, unspecified: Secondary | ICD-10-CM

## 2019-04-02 NOTE — Chronic Care Management (AMB) (Signed)
   Care Management   Social Work Note  04/02/2019 Name: Martha Herrera MRN: [id number] DOB: 07-13-1995  Martha Herrera is a 23 y.o. year old female who sees Trinna Post, [place] for primary care. The CCM team was consulted for assistance with Mental Health Counseling and Resources.   Follow up phone call to continue to assess for social work needs at this time. Patient continues to report improvement in mood in the last several weeks. Patient did state that she and her family were recovering from having COVID-19. Per patient, they are all doing fine now. Patient attributes her improvement due to a much needed break from work and being able to spend quality time with family.  The [place] Depression screen was repeated today and her score was lower than the last date she took it indicating no symptoms of distress.  Outpatient Encounter Medications as of 04/02/2019  Medication Sig  . fluticasone (FLONASE) 50 MCG/ACT nasal spray Place 1 spray into both nostrils daily.  Marland Kitchen loratadine (CLARITIN) 10 MG tablet Take 10 mg by mouth daily.  Marland Kitchen omeprazole (PRILOSEC) 40 MG capsule Take 1 capsule (40 mg total) by mouth daily.  . sertraline (ZOLOFT) 50 MG tablet Take 1 tablet (50 mg total) by mouth daily.   No facility-administered encounter medications on file as of 04/02/2019.     Goals Addressed   None     Follow Up Plan: Client will contact this social worker if there are any additional community resource needs in the future.   Elliot Gurney, [place] Worker  [facility] Care Management [phone]

## 2019-04-15 ENCOUNTER — Encounter: Payer: Self-pay | Admitting: Physician Assistant

## 2019-04-16 NOTE — Telephone Encounter (Signed)
Pt advised.  Apt made for 04/18/2019  Thanks,   -Mickel Baas

## 2019-04-18 ENCOUNTER — Telehealth (INDEPENDENT_AMBULATORY_CARE_PROVIDER_SITE_OTHER): Payer: Medicaid Other | Admitting: Physician Assistant

## 2019-04-18 DIAGNOSIS — Z77011 Contact with and (suspected) exposure to lead: Secondary | ICD-10-CM

## 2019-04-18 NOTE — Progress Notes (Signed)
   Subjective:    Patient ID: Martha Herrera, female    DOB: 07/14/1995, 23 y.o.   MRN: [id number]  [name] is a 23 y.o. female presenting on 04/18/2019 for Chemical Exposure  Virtual Visit via Telephone Note  I connected with Martha Herrera on 04/18/19 at  1:40 PM EST by telephone and verified that I am speaking with the correct person using two identifiers.   I discussed the limitations, risks, security and privacy concerns of performing an evaluation and management service by telephone and the availability of in person appointments. I also discussed with the patient that there may be a patient responsible charge related to this service. The patient expressed understanding and agreed to proceed.    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 Lead paint is around window sill, carport, and door. Patient is unsure if she works in the same rooms as the lead paint. Patient is not having any symptoms like SOB or abdominal pain.   Social History   Tobacco Use  . Smoking status: Never Smoker  . Smokeless tobacco: Never Used  Substance Use Topics  . Alcohol use: No  . Drug use: No    Review of Systems Per HPI unless specifically indicated above     Objective:    There were no vitals taken for this visit.  Wt Readings from Last 3 Encounters:  11/14/18 184 lb (83.5 kg)  10/01/18 181 lb 8 oz (82.3 kg)  09/16/18 185 lb (83.9 kg)    Physical Exam Results for orders placed or performed in visit on 03/18/19  Novel Coronavirus, NAA ([facility])   Specimen: Oropharyngeal(OP) collection in vial transport medium   OROPHARYNGEA  TESTING  Result Value Ref Range   SARS-CoV-2, NAA Detected (A) Not Detected      Assessment & Plan:  1. H/O lead exposure  Low risk but patient would be assured by labs. Bloodwork as below.   - Lead, blood (adult age 59 yrs or greater)  I have spent 15 minutes with this  patient, >50% of which was spent on counseling and coordination of care.    Carles Collet, PA-C [facility] 04/18/2019, 1:43 PM

## 2019-04-28 ENCOUNTER — Encounter: Payer: Self-pay | Admitting: Physician Assistant

## 2019-04-30 ENCOUNTER — Telehealth (INDEPENDENT_AMBULATORY_CARE_PROVIDER_SITE_OTHER): Payer: Medicaid Other | Admitting: Physician Assistant

## 2019-04-30 DIAGNOSIS — F419 Anxiety disorder, unspecified: Secondary | ICD-10-CM | POA: Diagnosis not present

## 2019-04-30 DIAGNOSIS — Z8632 Personal history of gestational diabetes: Secondary | ICD-10-CM

## 2019-04-30 DIAGNOSIS — F329 Major depressive disorder, single episode, unspecified: Secondary | ICD-10-CM | POA: Diagnosis not present

## 2019-04-30 MED ORDER — BUSPIRONE HCL 7.5 MG PO TABS: 7.5000 mg | ORAL_TABLET | Freq: Two times a day (BID) | ORAL | 0 refills | Status: AC

## 2019-04-30 NOTE — Progress Notes (Signed)
Patient: Martha Herrera Female    DOB: [birth year]   23 y.o.   MRN: [id number] Visit Date: 04/30/2019  Today's Provider: Trinna Post, PA-C   Chief Complaint  Patient presents with  . Anxiety  . Depression   Subjective:    Virtual Visit via Telephone Note  I connected with Martha Herrera on 04/30/19 at 10:40 AM EST by telephone verified that I am speaking with the correct person using two identifiers.  Location: Patient: Home Provider: Office   I discussed the limitations of evaluation and management by telemedicine and the availability of in person appointments. The patient expressed understanding and agreed to proceed.  HPI    Anxiety and Depression Patient presents today virtually for anxiety and depression follow-up. Patient last visit was virtually on 02/26/2019. Patient is not taking Zoloft 50 MG due to symptoms and has not taken for two days. Patient states that she is having the following symptoms lightheaded, dizzy spells, nauseous and severe migraines. Patient feels dizziness and as if she would just faint. She has not passed out. Denies palpitations, chest pain, SOB, nausea, vomiting. She is eating and drinking normally.   GAD 7 : Generalized Anxiety Score 04/30/2019 02/26/2019  Nervous, Anxious, on Edge 0 2  Control/stop worrying 0 2  Worry too much - different things 0 2  Trouble relaxing 0 3  Restless 0 1  Easily annoyed or irritable 0 3  Afraid - awful might happen 0 0  Total GAD 7 Score 0 13  Anxiety Difficulty Not difficult at all Very difficult   Depression screen [facility] 2/9 04/30/2019 03/19/2019 02/26/2019 07/12/2018 05/23/2018  Decreased Interest 0 0 3 1 0  Down, Depressed, Hopeless 0 0 2 0 0  PHQ - 2 Score 0 0 5 1 0  Altered sleeping 1 0 3 - -  Tired, decreased energy 1 0 3 - -  Change in appetite 0 0 1 - -  Feeling bad or failure about yourself  0 0 3 - -  Trouble concentrating 0 0 0 - -  Moving slowly or fidgety/restless 0 0 2 - -  Suicidal  thoughts 0 0 0 - -  PHQ-9 Score 2 0 17 - -  Difficult doing work/chores Not difficult at all - Very difficult - -   Patient with history of gestational diabetes concerned about sugars.   Allergies  Allergen Reactions  . Penicillins Hives, Itching and Rash    Did it involve swelling of the face/tongue/throat, SOB, or low BP? No Did it involve sudden or severe rash/hives, skin peeling, or any reaction on the inside of your mouth or nose? No Did you need to seek medical attention at a hospital or doctor's office? No When did it last happen?childhood allergy If all above answers are "NO", may proceed with cephalosporin use.   Marland Kitchen Amoxil [Amoxicillin] Itching and Rash    Did it involve swelling of the face/tongue/throat, SOB, or low BP? No Did it involve sudden or severe rash/hives, skin peeling, or any reaction on the inside of your mouth or nose? No Did you need to seek medical attention at a hospital or doctor's office? No When did it last happen?2019 If all above answers are "NO", may proceed with cephalosporin use.     Current Outpatient Medications:  .  fluticasone (FLONASE) 50 MCG/ACT nasal spray, Place 1 spray into both nostrils daily., Disp: , Rfl:  .  loratadine (CLARITIN) 10 MG tablet, Take 10  mg by mouth daily., Disp: , Rfl:  .  omeprazole (PRILOSEC) 40 MG capsule, Take 1 capsule (40 mg total) by mouth daily., Disp: 30 capsule, Rfl: 6 .  sertraline (ZOLOFT) 50 MG tablet, Take 1 tablet (50 mg total) by mouth daily. (Patient not taking: Reported on 04/30/2019), Disp: 90 tablet, Rfl: 0  Review of Systems  Constitutional: Negative.   Respiratory: Negative.   Cardiovascular: Negative.   Genitourinary: Negative.   Neurological: Negative.     Social History   Tobacco Use  . Smoking status: Never Smoker  . Smokeless tobacco: Never Used  Substance Use Topics  . Alcohol use: No      Objective:   There were no vitals taken for this visit. There were no vitals  filed for this visit.There is no height or weight on file to calculate BMI.   Physical Exam   No results found for any visits on 04/30/19.     Assessment & Plan    1. Anxiety and depression  Will switch zoloft to buspar as below.  - busPIRone (BUSPAR) 7.5 MG tablet; Take 1 tablet (7.5 mg total) by mouth 2 (two) times daily.  Dispense: 180 tablet; Refill: 0  I discussed the assessment and treatment plan with the patient. The patient was provided an opportunity to ask questions and all were answered. The patient agreed with the plan and demonstrated an understanding of the instructions.   2. History of gestational diabetes  Explained normal fasting range of sugars and also normal post prandial sugars. We can check A1c next time she is in the office.   The patient was advised to call back or seek an in-person evaluation if the symptoms worsen or if the condition fails to improve as anticipated.  I provided 25 minutes of non-face-to-face time during this encounter.  The entirety of the information documented in the History of Present Illness, Review of Systems and Physical Exam were personally obtained by me. Portions of this information were initially documented by [facility] and reviewed by me for thoroughness and accuracy.      Trey Sailors, PA-C  [facility]

## 2019-05-01 ENCOUNTER — Encounter: Payer: Self-pay | Admitting: Physician Assistant

## 2019-08-13 DIAGNOSIS — M7062 Trochanteric bursitis, left hip: Secondary | ICD-10-CM | POA: Diagnosis not present

## 2019-09-02 ENCOUNTER — Encounter: Payer: Self-pay | Admitting: Physician Assistant

## 2019-09-02 ENCOUNTER — Ambulatory Visit
Admission: RE | Admit: 2019-09-02 | Discharge: 2019-09-02 | Disposition: A | Discharge status: Home / Self Care | Payer: Medicaid Other | Attending: Physician Assistant | Admitting: Physician Assistant

## 2019-09-02 ENCOUNTER — Ambulatory Visit (INDEPENDENT_AMBULATORY_CARE_PROVIDER_SITE_OTHER): Payer: Medicaid Other | Admitting: Physician Assistant

## 2019-09-02 ENCOUNTER — Ambulatory Visit
Admission: RE | Admit: 2019-09-02 | Discharge: 2019-09-02 | Disposition: A | Discharge status: Home / Self Care | Payer: Medicaid Other | Source: Ambulatory Visit | Attending: Physician Assistant | Admitting: Physician Assistant

## 2019-09-02 ENCOUNTER — Other Ambulatory Visit: Payer: Self-pay

## 2019-09-02 VITALS — BP 132/76 | HR 75 | Temp 97.3°F | Wt 193.2 lb

## 2019-09-02 DIAGNOSIS — M25562 Pain in left knee: Secondary | ICD-10-CM

## 2019-09-02 DIAGNOSIS — S8992XA Unspecified injury of left lower leg, initial encounter: Secondary | ICD-10-CM | POA: Diagnosis not present

## 2019-09-02 DIAGNOSIS — M7989 Other specified soft tissue disorders: Secondary | ICD-10-CM | POA: Diagnosis not present

## 2019-09-02 MED ORDER — MELOXICAM 7.5 MG PO TABS: 7.5000 mg | ORAL_TABLET | Freq: Every day | ORAL | 0 refills | Status: DC

## 2019-09-02 NOTE — Patient Instructions (Signed)

## 2019-09-02 NOTE — Progress Notes (Signed)
Established patient visit   Patient: Martha Herrera   DOB: [birth year]   24 y.o. Female  MRN: [id number] Visit Date: 09/02/2019  Today's healthcare provider: Trey Sailors, PA-C   Chief Complaint  Patient presents with  . Knee Pain  I,Victoriya Pol M Kerin Kren,acting as a scribe for Trey Sailors, PA-C.,have documented all relevant documentation on the behalf of Trey Sailors, PA-C,as directed by  Trey Sailors, PA-C while in the presence of Trey Sailors, PA-C.  Subjective    Knee Pain  The incident occurred 2 days ago. The injury mechanism was a fall. The pain is present in the left knee and right ankle. The quality of the pain is described as aching. The pain is at a severity of 7/10. The pain is moderate. The pain has been constant since onset. Associated symptoms include an inability to bear weight. Pertinent negatives include no muscle weakness, numbness or tingling. She reports no foreign bodies present. The symptoms are aggravated by movement and weight bearing. She has tried acetaminophen (everyday since fall per pt) for the symptoms. The treatment provided no relief.   Patient has history of patellofemoral syndrome in her left knee and usually has pain and swelling in her left knee. Fell directly on gravel two days ago. Was able to get up and walk after this. No prior injuries of this joint.      Medications: Outpatient Medications Prior to Visit  Medication Sig  . fluticasone (FLONASE) 50 MCG/ACT nasal spray Place 1 spray into both nostrils daily.  Marland Kitchen loratadine (CLARITIN) 10 MG tablet Take 10 mg by mouth daily.  Marland Kitchen omeprazole (PRILOSEC) 40 MG capsule Take 1 capsule (40 mg total) by mouth daily.  . sertraline (ZOLOFT) 50 MG tablet Take 1 tablet (50 mg total) by mouth daily. (Patient not taking: Reported on 04/30/2019)   No facility-administered medications prior to visit.    Review of Systems  Neurological: Negative for tingling and numbness.      Objective    BP 132/76 (BP Location: Left Arm, Patient Position: Sitting, Cuff Size: Normal)   Pulse 75   Temp (!) 97.3 F (36.3 C) (Temporal)   Wt 193 lb 3.2 oz (87.6 kg)   LMP 08/25/2019 (Exact Date)   SpO2 99%   BMI 39.69 kg/m    Physical Exam Constitutional:      Appearance: Normal appearance.  Musculoskeletal:     Right knee: Normal.     Left knee: Swelling, laceration and bony tenderness present. No crepitus. Decreased range of motion. Tenderness present over the patellar tendon. Normal alignment, normal meniscus and normal patellar mobility.  Skin:    General: Skin is warm and dry.  Neurological:     Mental Status: She is alert and oriented to person, place, and time. Mental status is at baseline.  Psychiatric:        Mood and Affect: Mood normal.        Behavior: Behavior normal.       No results found for any visits on 09/02/19.  Assessment & Plan    1. Acute pain of left knee  Xrayleft knee after direct fall, possibly fractured. If so, will need orthopedics. Meloxicam as below. Schedule for CPE and PAP in 10/2019.   - DG Knee Complete 4 Views Left; Future - DG Tibia/Fibula Left; Future - meloxicam (MOBIC) 7.5 MG tablet; Take 1 tablet (7.5 mg total) by mouth daily.  Dispense: 30 tablet; Refill: 0  ITrinna Post, PA-C, have reviewed all documentation for this visit. The documentation on 09/02/19 for the exam, diagnosis, procedures, and orders are all accurate and complete.    Paulene Floor  [facility] [phone] (phone) [phone] (fax)  [place]

## 2019-09-08 ENCOUNTER — Encounter: Payer: Self-pay | Admitting: Physician Assistant

## 2019-09-09 MED ORDER — OMEPRAZOLE 40 MG PO CPDR: 40.0000 mg | DELAYED_RELEASE_CAPSULE | Freq: Every day | ORAL | 6 refills | Status: DC

## 2019-09-16 ENCOUNTER — Encounter: Payer: Self-pay | Admitting: Physician Assistant

## 2019-09-17 ENCOUNTER — Other Ambulatory Visit: Payer: Self-pay

## 2019-09-17 ENCOUNTER — Encounter: Payer: Self-pay | Admitting: Physician Assistant

## 2019-09-17 ENCOUNTER — Other Ambulatory Visit: Payer: Medicaid Other

## 2019-09-17 ENCOUNTER — Telehealth (INDEPENDENT_AMBULATORY_CARE_PROVIDER_SITE_OTHER): Payer: Medicaid Other | Admitting: Physician Assistant

## 2019-09-17 VITALS — Temp 96.3°F

## 2019-09-17 DIAGNOSIS — R0602 Shortness of breath: Secondary | ICD-10-CM

## 2019-09-17 DIAGNOSIS — J029 Acute pharyngitis, unspecified: Secondary | ICD-10-CM | POA: Diagnosis not present

## 2019-09-17 DIAGNOSIS — R059 Cough, unspecified: Secondary | ICD-10-CM

## 2019-09-17 DIAGNOSIS — E669 Obesity, unspecified: Secondary | ICD-10-CM

## 2019-09-17 DIAGNOSIS — R05 Cough: Secondary | ICD-10-CM | POA: Diagnosis not present

## 2019-09-17 DIAGNOSIS — G43809 Other migraine, not intractable, without status migrainosus: Secondary | ICD-10-CM | POA: Diagnosis not present

## 2019-09-17 MED ORDER — SUMATRIPTAN SUCCINATE 50 MG PO TABS: ORAL_TABLET | ORAL | 0 refills | Status: DC

## 2019-09-17 MED ORDER — PROMETHAZINE-DM 6.25-15 MG/5ML PO SYRP: 5.0000 mL | ORAL_SOLUTION | Freq: Every evening | ORAL | 0 refills | Status: DC | PRN

## 2019-09-17 NOTE — Progress Notes (Signed)
MyChart Video Visit    Virtual Visit via Video Note   This visit type was conducted due to national recommendations for restrictions regarding the COVID-19 Pandemic (e.g. social distancing) in an effort to limit this patient's exposure and mitigate transmission in our community. This patient is at least at moderate risk for complications without adequate follow up. This format is felt to be most appropriate for this patient at this time. Physical exam was limited by quality of the video and audio technology used for the visit.   Patient location: Home Provider location: Office   Patient: Martha Herrera   DOB: 1996/04/10   24 y.o. Female  MRN: [id number] Visit Date: 09/17/2019  Today's healthcare provider: Trinna Post, PA-C   Chief Complaint  Patient presents with  . URI   Subjective    URI  This is a new problem. The current episode started yesterday. The problem has been unchanged. There has been no fever. Associated symptoms include ear pain, headaches ("migraine"), nausea and a sore throat (Left side ). Pertinent negatives include no congestion, coughing, diarrhea, rhinorrhea, sinus pain or sneezing. Associated symptoms comments: SOB, Fatigue, body ache all over, feeling very thirsty. Ankles, hands swelling yesterday.. She has tried acetaminophen for the symptoms. The treatment provided no relief.   Reports she has had a migraine x 3 days which has not responded to tylenol or ibuprofen. Reports sore throat on left side when she swallows. Reports some shortness of breath. She did test positive for COVID in 03/2019.     Medications: Outpatient Medications Prior to Visit  Medication Sig  . fluticasone (FLONASE) 50 MCG/ACT nasal spray Place 1 spray into both nostrils daily.  Marland Kitchen loratadine (CLARITIN) 10 MG tablet Take 10 mg by mouth daily.  . meloxicam (MOBIC) 7.5 MG tablet Take 1 tablet (7.5 mg total) by mouth daily.  Marland Kitchen omeprazole (PRILOSEC) 40 MG capsule Take 1 capsule (40  mg total) by mouth daily.  . sertraline (ZOLOFT) 50 MG tablet Take 1 tablet (50 mg total) by mouth daily. (Patient not taking: Reported on 04/30/2019)   No facility-administered medications prior to visit.    Review of Systems  Constitutional: Positive for fatigue.  HENT: Positive for ear pain and sore throat (Left side ). Negative for congestion, rhinorrhea, sinus pressure, sinus pain and sneezing.   Respiratory: Positive for shortness of breath. Negative for cough.   Gastrointestinal: Positive for nausea. Negative for diarrhea.  Endocrine: Positive for polydipsia. Negative for polyuria.  Neurological: Positive for headaches ("migraine").      Objective    Temp (!) 96.3 F (35.7 C) (Temporal)   LMP 08/25/2019 (Exact Date)    Physical Exam Constitutional:      General: She is not in acute distress.    Appearance: Normal appearance. She is not ill-appearing.  Pulmonary:     Effort: Pulmonary effort is normal. No respiratory distress.  Neurological:     Mental Status: She is alert. Mental status is at baseline.  Psychiatric:        Mood and Affect: Mood normal.        Behavior: Behavior normal.        Assessment & Plan    1. Other migraine without status migrainosus, not intractable  take 1 pill early on when you suspect a migraine attack coming on. You may take another pill within 2 hours, no more than 2 pills in 24 hours.   Most people who take triptans do not have any  serious side-effects. However, they can cause drowsiness (remember to not drive or use heavy machinery when drowsy), nausea, dizziness, dry mouth.   Less common side effects include strange sensations, such as tightness in your chest or throat, tingling, flushing, and feelings of heaviness or pressure in areas such as the face, limbs, and chest. These in the chest can mimic heart related pain (angina) and may cause alarm, but usually these sensations are not harmful or a sign of a heart attack. However, if  you develop intense chest pain or sensations of discomfort, you should stop taking your medication and consult with me or go to the nearest urgent care facility or ER or call 911.   - SUMAtriptan (IMITREX) 50 MG tablet; May repeat in 2 hours if headache persists or recurs.  Dispense: 10 tablet; Refill: 0  2. Sore throat  - symptoms and exam c/w viral URI  - no evidence of strep pharyngitis, CAP, AOM, bacterial sinusitis, or other bacterial infection - concern for possible COVID19 infection - will send for outpatient testing - discussed need to quarantine 10 days from start of symptoms and until fever-free for at least 48 hours - discussed need to quarantine household members - discussed symptomatic management, natural course, and return precautions  - promethazine-dextromethorphan (PROMETHAZINE-DM) 6.25-15 MG/5ML syrup; Take 5 mLs by mouth at bedtime as needed.  Dispense: 118 mL; Refill: 0  3. SOB (shortness of breath)   4. Cough  - promethazine-dextromethorphan (PROMETHAZINE-DM) 6.25-15 MG/5ML syrup; Take 5 mLs by mouth at bedtime as needed.  Dispense: 118 mL; Refill: 0  5. Obesity without serious comorbidity, unspecified classification, unspecified obesity type  Patient is concerned about sugars and would like A1c checked earlier than CPE if she is negative for COVID>    - HgB A1c   No follow-ups on file.     I discussed the assessment and treatment plan with the patient. The patient was provided an opportunity to ask questions and all were answered. The patient agreed with the plan and demonstrated an understanding of the instructions.   The patient was advised to call back or seek an in-person evaluation if the symptoms worsen or if the condition fails to improve as anticipated.  I provided 25 minutes of non-face-to-face time during this encounter.  ITrey Sailors, PA-C, have reviewed all documentation for this visit. The documentation on 09/17/19 for the exam,  diagnosis, procedures, and orders are all accurate and complete.   Maryella Shivers [facility] [phone] (phone) [phone] (fax)  [facility]

## 2019-09-18 ENCOUNTER — Other Ambulatory Visit: Payer: Medicaid Other

## 2019-09-18 ENCOUNTER — Encounter: Payer: Self-pay | Admitting: Physician Assistant

## 2019-09-24 ENCOUNTER — Encounter: Payer: Self-pay | Admitting: Physician Assistant

## 2019-09-30 ENCOUNTER — Telehealth: Payer: Self-pay | Admitting: Physician Assistant

## 2019-09-30 NOTE — Telephone Encounter (Signed)
Patient was contacted and schedule appointment for 10/02/2019 @ 4:00PM.

## 2019-09-30 NOTE — Telephone Encounter (Signed)
Please schedule patient for mychart OV or in person.

## 2019-10-02 ENCOUNTER — Ambulatory Visit
Admission: RE | Admit: 2019-10-02 | Discharge: 2019-10-02 | Disposition: A | Discharge status: Home / Self Care | Payer: Medicaid Other | Attending: Physician Assistant | Admitting: Physician Assistant

## 2019-10-02 ENCOUNTER — Ambulatory Visit (INDEPENDENT_AMBULATORY_CARE_PROVIDER_SITE_OTHER): Payer: Medicaid Other | Admitting: Physician Assistant

## 2019-10-02 ENCOUNTER — Ambulatory Visit
Admission: RE | Admit: 2019-10-02 | Discharge: 2019-10-02 | Disposition: A | Discharge status: Home / Self Care | Payer: Medicaid Other | Source: Ambulatory Visit | Attending: Physician Assistant | Admitting: Physician Assistant

## 2019-10-02 ENCOUNTER — Other Ambulatory Visit: Payer: Self-pay

## 2019-10-02 ENCOUNTER — Encounter: Payer: Self-pay | Admitting: Physician Assistant

## 2019-10-02 ENCOUNTER — Ambulatory Visit: Payer: Self-pay | Admitting: Physician Assistant

## 2019-10-02 ENCOUNTER — Other Ambulatory Visit: Payer: Self-pay | Admitting: Physician Assistant

## 2019-10-02 VITALS — BP 118/84 | HR 84 | Temp 96.9°F | Resp 16 | Wt 196.0 lb

## 2019-10-02 DIAGNOSIS — M7989 Other specified soft tissue disorders: Secondary | ICD-10-CM | POA: Diagnosis not present

## 2019-10-02 DIAGNOSIS — M722 Plantar fascial fibromatosis: Secondary | ICD-10-CM

## 2019-10-02 DIAGNOSIS — M25572 Pain in left ankle and joints of left foot: Secondary | ICD-10-CM | POA: Insufficient documentation

## 2019-10-02 DIAGNOSIS — M79672 Pain in left foot: Secondary | ICD-10-CM | POA: Diagnosis not present

## 2019-10-02 DIAGNOSIS — Z6830 Body mass index (BMI) 30.0-30.9, adult: Secondary | ICD-10-CM | POA: Diagnosis not present

## 2019-10-02 DIAGNOSIS — E669 Obesity, unspecified: Secondary | ICD-10-CM | POA: Diagnosis not present

## 2019-10-02 NOTE — Progress Notes (Deleted)
     Established patient visit   Patient: Martha Herrera   DOB: 12-11-1995   24 y.o. Female  MRN: [id number] Visit Date: 10/02/2019  Today's healthcare provider: Trey Sailors, PA-C   Chief Complaint  Patient presents with  . Joint Swelling   Subjective    HPI Ankle swelling: Patient complains of swelling of the left ankle. Swelling worsened over the past month but has been present for years. Patient denies any trauma or injury.  She has tried soaking ankle in epsom salt and keeping feet elevated. She has been taking meloxicam for inflammation and muscle relaxer's. She has tried compression socks in the past but is unsure if they helped ankle swelling. She reports pain the sole of her left foot that is the worst after a period of inactivity.  {Show patient history (optional):23778::" "}   Medications: Outpatient Medications Prior to Visit  Medication Sig  . fluticasone (FLONASE) 50 MCG/ACT nasal spray Place 1 spray into both nostrils daily.  Marland Kitchen loratadine (CLARITIN) 10 MG tablet Take 10 mg by mouth daily.  . meloxicam (MOBIC) 7.5 MG tablet Take 1 tablet (7.5 mg total) by mouth daily.  Marland Kitchen omeprazole (PRILOSEC) 40 MG capsule Take 1 capsule (40 mg total) by mouth daily.  . promethazine-dextromethorphan (PROMETHAZINE-DM) 6.25-15 MG/5ML syrup Take 5 mLs by mouth at bedtime as needed.  . SUMAtriptan (IMITREX) 50 MG tablet May repeat in 2 hours if headache persists or recurs.  . [DISCONTINUED] sertraline (ZOLOFT) 50 MG tablet Take 1 tablet (50 mg total) by mouth daily. (Patient not taking: Reported on 04/30/2019)   No facility-administered medications prior to visit.    Review of Systems  Constitutional: Negative for appetite change, chills, fatigue and fever.  Respiratory: Negative for chest tightness and shortness of breath.   Cardiovascular: Negative for chest pain and palpitations.  Gastrointestinal: Negative for abdominal pain, nausea and vomiting.  Musculoskeletal: Positive for  arthralgias (left ankle).  Neurological: Negative for dizziness and weakness.    {Heme  Chem  Endocrine  Serology  Results Review (optional):23779::" "}  Objective    There were no vitals taken for this visit. {Show previous vital signs (optional):23777::" "}  Physical Exam Constitutional:      Appearance: Normal appearance.  Musculoskeletal:     Right ankle: Normal.     Left ankle: Swelling present. No deformity. Normal range of motion.  Neurological:     Mental Status: She is alert.     ***  No results found for any visits on 10/02/19.  Assessment & Plan    1. Acute left ankle pain  Xrays. Recommend wider shoes, arch support and compression stockings.   - DG Ankle Complete Left; Future - DG Foot Complete Left; Future - Ambulatory referral to Podiatry  2. Plantar fasciitis  - DG Ankle Complete Left; Future - DG Foot Complete Left; Future - Ambulatory referral to Podiatry  3. Class 1 obesity without serious comorbidity with body mass index (BMI) of 30.0 to 30.9 in adult, unspecified obesity type  - Lipid Profile - Comprehensive Metabolic Panel (CMET) - CBC with Differential    No follow-ups on file.      ITrey Sailors, PA-C, have reviewed all documentation for this visit. The documentation on 10/03/19 for the exam, diagnosis, procedures, and orders are all accurate and complete.    Maryella Shivers  [facility] [phone] (phone) [phone] (fax)  [facility]

## 2019-10-02 NOTE — Patient Instructions (Signed)

## 2019-10-03 LAB — COMPREHENSIVE METABOLIC PANEL
ALT: 17 IU/L (ref 0–32)
AST: 17 IU/L (ref 0–40)
Albumin/Globulin Ratio: 2.1 (ref 1.2–2.2)
Albumin: 4.6 g/dL (ref 3.9–5.0)
Alkaline Phosphatase: 107 IU/L (ref 48–121)
BUN/Creatinine Ratio: 22 (ref 9–23)
BUN: 15 mg/dL (ref 6–20)
Bilirubin Total: 0.3 mg/dL (ref 0.0–1.2)
CO2: 23 mmol/L (ref 20–29)
Calcium: 9 mg/dL (ref 8.7–10.2)
Chloride: 101 mmol/L (ref 96–106)
Creatinine, Ser: 0.68 mg/dL (ref 0.57–1.00)
GFR calc Af Amer: 142 mL/min/{1.73_m2} (ref >59)
GFR calc non Af Amer: 123 mL/min/{1.73_m2} (ref >59)
Globulin, Total: 2.2 g/dL (ref 1.5–4.5)
Glucose: 93 mg/dL (ref 65–99)
Potassium: 3.8 mmol/L (ref 3.5–5.2)
Sodium: 138 mmol/L (ref 134–144)
Total Protein: 6.8 g/dL (ref 6.0–8.5)

## 2019-10-03 LAB — LIPID PANEL
Chol/HDL Ratio: 4.1 ratio (ref 0.0–4.4)
Cholesterol, Total: 192 mg/dL (ref 100–199)
HDL: 47 mg/dL (ref >39)
LDL Chol Calc (NIH): 108 mg/dL — ABNORMAL HIGH (ref 0–99)
Triglycerides: 216 mg/dL — ABNORMAL HIGH (ref 0–149)
VLDL Cholesterol Cal: 37 mg/dL (ref 5–40)

## 2019-10-03 LAB — CBC WITH DIFFERENTIAL/PLATELET
Basophils Absolute: 0 10*3/uL (ref 0.0–0.2)
Basos: 1 %
EOS (ABSOLUTE): 0.2 10*3/uL (ref 0.0–0.4)
Eos: 2 %
Hematocrit: 41.4 % (ref 34.0–46.6)
Hemoglobin: 13.7 g/dL (ref 11.1–15.9)
Immature Grans (Abs): 0 10*3/uL (ref 0.0–0.1)
Immature Granulocytes: 1 %
Lymphocytes Absolute: 2.8 10*3/uL (ref 0.7–3.1)
Lymphs: 32 %
MCH: 27.4 pg (ref 26.6–33.0)
MCHC: 33.1 g/dL (ref 31.5–35.7)
MCV: 83 fL (ref 79–97)
Monocytes Absolute: 0.5 10*3/uL (ref 0.1–0.9)
Monocytes: 6 %
Neutrophils Absolute: 5.3 10*3/uL (ref 1.4–7.0)
Neutrophils: 58 %
Platelets: 304 10*3/uL (ref 150–450)
RBC: 5 x10E6/uL (ref 3.77–5.28)
RDW: 12.9 % (ref 11.7–15.4)
WBC: 8.8 10*3/uL (ref 3.4–10.8)

## 2019-10-03 LAB — HEMOGLOBIN A1C
Est. average glucose Bld gHb Est-mCnc: 100 mg/dL
Hgb A1c MFr Bld: 5.1 % (ref 4.8–5.6)

## 2019-10-03 NOTE — Progress Notes (Signed)
     Established patient visit   Patient: Martha Herrera   DOB: 20-Aug-1995   24 y.o. Female  MRN: [id number] Visit Date: 10/02/2019  Today's healthcare provider: Trey Sailors, PA-C   Chief Complaint  Patient presents with  . Joint Swelling   Subjective    HPI Ankle swelling: Patient complains of swelling of the left ankle. Swelling worsened over the past month but has been present for years. Patient denies any trauma or injury.  She has tried soaking ankle in epsom salt and keeping feet elevated. She has been taking meloxicam for inflammation and muscle relaxer's. She has tried compression socks in the past but is unsure if they helped ankle swelling. She reports pain the sole of her left foot that is the worst after a period of inactivity.     Medications: Outpatient Medications Prior to Visit  Medication Sig  . fluticasone (FLONASE) 50 MCG/ACT nasal spray Place 1 spray into both nostrils daily.  Marland Kitchen loratadine (CLARITIN) 10 MG tablet Take 10 mg by mouth daily.  . meloxicam (MOBIC) 7.5 MG tablet Take 1 tablet (7.5 mg total) by mouth daily.  Marland Kitchen omeprazole (PRILOSEC) 40 MG capsule Take 1 capsule (40 mg total) by mouth daily.  . promethazine-dextromethorphan (PROMETHAZINE-DM) 6.25-15 MG/5ML syrup Take 5 mLs by mouth at bedtime as needed.  . SUMAtriptan (IMITREX) 50 MG tablet May repeat in 2 hours if headache persists or recurs.  . [DISCONTINUED] sertraline (ZOLOFT) 50 MG tablet Take 1 tablet (50 mg total) by mouth daily. (Patient not taking: Reported on 04/30/2019)   No facility-administered medications prior to visit.    Review of Systems  Constitutional: Negative for appetite change, chills, fatigue and fever.  Respiratory: Negative for chest tightness and shortness of breath.   Cardiovascular: Negative for chest pain and palpitations.  Gastrointestinal: Negative for abdominal pain, nausea and vomiting.  Musculoskeletal: Positive for arthralgias (left ankle).  Neurological:  Negative for dizziness and weakness.      Objective    There were no vitals taken for this visit.   Physical Exam Constitutional:      Appearance: Normal appearance.  Musculoskeletal:     Right ankle: Normal.     Left ankle: Swelling present. No deformity. Normal range of motion.  Neurological:     Mental Status: She is alert.       No results found for any visits on 10/02/19.  Assessment & Plan    1. Acute left ankle pain  Xrays. Recommend wider shoes, arch support and compression stockings.   - DG Ankle Complete Left; Future - DG Foot Complete Left; Future - Ambulatory referral to Podiatry  2. Plantar fasciitis  - DG Ankle Complete Left; Future - DG Foot Complete Left; Future - Ambulatory referral to Podiatry  3. Class 1 obesity without serious comorbidity with body mass index (BMI) of 30.0 to 30.9 in adult, unspecified obesity type  - Lipid Profile - Comprehensive Metabolic Panel (CMET) - CBC with Differential    No follow-ups on file.      ITrey Sailors, PA-C, have reviewed all documentation for this visit. The documentation on 10/03/19 for the exam, diagnosis, procedures, and orders are all accurate and complete.    Maryella Shivers  [facility] [phone] (phone) [phone] (fax)  [facility]

## 2019-10-22 ENCOUNTER — Ambulatory Visit: Payer: Medicaid Other | Admitting: Podiatry

## 2019-11-04 ENCOUNTER — Encounter: Payer: Self-pay | Admitting: Physician Assistant

## 2019-11-05 ENCOUNTER — Other Ambulatory Visit: Payer: Self-pay

## 2019-11-05 ENCOUNTER — Ambulatory Visit (INDEPENDENT_AMBULATORY_CARE_PROVIDER_SITE_OTHER): Payer: Medicaid Other

## 2019-11-05 ENCOUNTER — Ambulatory Visit: Payer: Medicaid Other | Admitting: Podiatry

## 2019-11-05 ENCOUNTER — Encounter: Payer: Self-pay | Admitting: Podiatry

## 2019-11-05 ENCOUNTER — Other Ambulatory Visit: Payer: Self-pay | Admitting: Podiatry

## 2019-11-05 DIAGNOSIS — M66872 Spontaneous rupture of other tendons, left ankle and foot: Secondary | ICD-10-CM

## 2019-11-05 DIAGNOSIS — S86092A Other specified injury of left Achilles tendon, initial encounter: Secondary | ICD-10-CM | POA: Diagnosis not present

## 2019-11-05 DIAGNOSIS — S86112A Strain of other muscle(s) and tendon(s) of posterior muscle group at lower leg level, left leg, initial encounter: Secondary | ICD-10-CM

## 2019-11-05 DIAGNOSIS — M722 Plantar fascial fibromatosis: Secondary | ICD-10-CM

## 2019-11-05 NOTE — Progress Notes (Signed)
Subjective:  Patient ID: Martha Herrera, female    DOB: Apr 06, 1996,  MRN: [id number] HPI Chief Complaint  Patient presents with  . Ankle Pain    Patient presents today for left ankle pain.  She states "I fell back in May and hurt my knee and twisted my ankle.  I didn't have any swelling that I noticed, but I still have pain.  The pain is really bad when I first get up in the mornings or if Im on my foot all day and its a stabbing pain"  She saw her pcp and was given Meloxicam.    24 y.o. female presents with the above complaint.   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  States that physical therapy has helped her knee however it has failed to alleviate symptoms along the medial aspect of her left foot.  States that she still has considerable pain on medial aspect of the left foot as well as by the end of the day.  Is starting to affect her ability to perform her daily activities.  Past Medical History:  Diagnosis Date  . Complication of anesthesia    hard to wake up  . Gastroesophageal reflux    takes prescribed meds daily  . Gestational diabetes   . Headache(784.0)    not as often  . Heart murmur   . Pneumonia 2013  . Varicella    Past Surgical History:  Procedure Laterality Date  . CESAREAN SECTION N/A 08/07/2018   Procedure: CESAREAN SECTION;  Surgeon: Lazaro Arms, MD;  Location: [facility];  Service: Obstetrics;  Laterality: N/A;  . ESOPHAGOGASTRODUODENOSCOPY  05/03/2012   Procedure: ESOPHAGOGASTRODUODENOSCOPY (EGD);  Surgeon: Jon Gills, MD;  Location: [facility] OR;  Service: Gastroenterology;  Laterality: N/A;  . tubes in ears    . WISDOM TOOTH EXTRACTION      Current Outpatient Medications:  .  cyclobenzaprine (FLEXERIL) 10 MG tablet, Take 10 mg by mouth 3 (three) times daily., Disp: , Rfl:  .  fluticasone  (FLONASE) 50 MCG/ACT nasal spray, Place 1 spray into both nostrils daily., Disp: , Rfl:  .  loratadine (CLARITIN) 10 MG tablet, Take 10 mg by mouth daily., Disp: , Rfl:  .  omeprazole (PRILOSEC) 40 MG capsule, Take 1 capsule (40 mg total) by mouth daily., Disp: 30 capsule, Rfl: 6 .  promethazine-dextromethorphan (PROMETHAZINE-DM) 6.25-15 MG/5ML syrup, Take 5 mLs by mouth at bedtime as needed., Disp: 118 mL, Rfl: 0 .  SUMAtriptan (IMITREX) 50 MG tablet, May repeat in 2 hours if headache persists or recurs., Disp: 10 tablet, Rfl: 0  Allergies  Allergen Reactions  . Penicillins Hives, Itching and Rash    Did it involve swelling of the face/tongue/throat, SOB, or low BP? No Did it involve sudden or severe rash/hives, skin peeling, or any reaction on the inside of your mouth or nose? No Did you need to seek medical attention at a hospital or doctor's office? No When did it last happen?childhood allergy If all above answers are "NO", may proceed with cephalosporin use.   Marland Kitchen Amoxil [Amoxicillin] Itching and Rash    Did it involve swelling of the face/tongue/throat, SOB, or low BP? No Did it involve sudden or severe rash/hives, skin peeling, or any reaction on the inside of your mouth or nose? No Did you need to  seek medical attention at a hospital or doctor's office? No When did it last happen?2019 If all above answers are "NO", may proceed with cephalosporin use.   Review of Systems Objective:  There were no vitals filed for this visit.  General: Well developed, nourished, in no acute distress, alert and oriented x3   Dermatological: Skin is warm, dry and supple bilateral. Nails x 10 are well maintained; remaining integument appears unremarkable at this time. There are no open sores, no preulcerative lesions, no rash or signs of infection present.  Vascular: Dorsalis Pedis artery and Posterior Tibial artery pedal pulses are 2/4 bilateral with immedate capillary fill time. Pedal hair  growth present. No varicosities and no lower extremity edema present bilateral.   Neruologic: Grossly intact via light touch bilateral. Vibratory intact via tuning fork bilateral. Protective threshold with Semmes Wienstein monofilament intact to all pedal sites bilateral. Patellar and Achilles deep tendon reflexes 2+ bilateral. No Babinski or clonus noted bilateral.   Musculoskeletal: No gross boney pedal deformities bilateral. No pain, crepitus, or limitation noted with foot and ankle range of motion bilateral. Muscular strength 5/5 in all groups tested bilateral.  She has severe pain on inversion against resistance of the posterior tibial tendon with palpation resulting in fluctuance along the tendon sheath itself.  The majority of the pain is located distal to the medial malleolus.  Gait: Unassisted, Nonantalgic.    Radiographs:  Radiographs taken today demonstrate an osseously mature individual mild pes planus.  No acute findings.  Soft tissue swelling along the medial aspect of the foot and ankle.  Ankle joint appears to be normal itself.  No fractures are identified.  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  Plan: Discussed etiology pathology conservative versus surgical therapies.  At this point placed her in a Tri-Lock brace left.  And we are obtaining an MRI.  All other conservative therapies have failed including rest ice compression elevation staying off of the foot and immobilization.     Agustine Rossitto T. [place], [place]

## 2019-11-10 NOTE — Progress Notes (Signed)
Complete physical exam   Patient: Martha Herrera   DOB: 05-Oct-1995   24 y.o. Female  MRN: [id number] Visit Date: 11/11/2019  Today's healthcare provider: Trinna Post, PA-C   Chief Complaint  Patient presents with  . Annual Exam  I,Martha Herrera,acting as a scribe for Trinna Post, PA-C.,have documented all relevant documentation on the behalf of Trinna Post, PA-C,as directed by  Trinna Post, PA-C while in the presence of Trinna Post, PA-C.  Subjective    Martha Herrera is a 24 y.o. female who presents today for a complete physical exam.  She reports consuming a general diet. The patient has a physically strenuous job, but has no regular exercise apart from work.  She generally feels well. She reports sleeping poorly. She does have additional problems to discuss today.  HPI    Past Medical History:  Diagnosis Date  . Complication of anesthesia    hard to wake up  . Gastroesophageal reflux    takes prescribed meds daily  . Gestational diabetes   . Headache(784.0)    not as often  . Heart murmur   . Pneumonia 2013  . Varicella    Past Surgical History:  Procedure Laterality Date  . CESAREAN SECTION N/A 08/07/2018   Procedure: CESAREAN SECTION;  Surgeon: Florian Buff, MD;  Location: [facility];  Service: Obstetrics;  Laterality: N/A;  . ESOPHAGOGASTRODUODENOSCOPY  05/03/2012   Procedure: ESOPHAGOGASTRODUODENOSCOPY (EGD);  Surgeon: Oletha Blend, MD;  Location: [place];  Service: Gastroenterology;  Laterality: N/A;  . tubes in ears    . WISDOM TOOTH EXTRACTION     Social History   Socioeconomic History  . Marital status: Married    Spouse name: Letizia Hook  . Number of children: 1  . Years of education: Not on file  . Highest education level: Associate degree: academic program  Occupational History  . Not on file  Tobacco Use  . Smoking status: Never Smoker  . Smokeless tobacco: Never Used  Vaping Use  . Vaping Use: Never used  Substance  and Sexual Activity  . Alcohol use: No  . Drug use: No  . Sexual activity: Yes    Birth control/protection: None, Condom  Other Topics Concern  . Not on file  Social History Narrative   10th grade   Social Determinants of Health   Financial Resource Strain:   . Difficulty of Paying Living Expenses:   Food Insecurity:   . Worried About Charity fundraiser in the Last Year:   . Arboriculturist in the Last Year:   Transportation Needs:   . Film/video editor (Medical):   Marland Kitchen Lack of Transportation (Non-Medical):   Physical Activity:   . Days of Exercise per Week:   . Minutes of Exercise per Session:   Stress:   . Feeling of Stress :   Social Connections:   . Frequency of Communication with Friends and Family:   . Frequency of Social Gatherings with Friends and Family:   . Attends Religious Services:   . Active Member of Clubs or Organizations:   . Attends Archivist Meetings:   Marland Kitchen Marital Status:   Intimate Partner Violence:   . Fear of Current or Ex-Partner:   . Emotionally Abused:   Marland Kitchen Physically Abused:   . Sexually Abused:    Family Status  Relation Name Status  . Father  Alive  . Brother 63 yo  Alive  . [place]  . Brother 23 yo Comptroller  . Mother  Alive  . MGF  Deceased at age 54       Lung Cancer  . Sister 64 Alive  . PGM  Alive  . PGF  Alive  . Mat [employer]  . [employer]  . Mat [employer]  . Pat Uncle x3 Alive  . Son  Alive  . Neg Hx  (Not Specified)   Family History  Problem Relation Age of Onset  . GER disease Father   . Cancer Father        kidney  . Asthma Brother   . Miscarriages / [place] Brother   . GER disease Maternal Grandmother   . Hyperlipidemia Maternal Grandmother   . ADD / ADHD Brother   . Miscarriages / [place] Mother   . Headache Mother   . Cancer Maternal Grandfather   . Diabetes Maternal Grandfather   . Hyperlipidemia Maternal Grandfather   . Vision loss Maternal Grandfather   . GER disease  Maternal Aunt   . Migraines Maternal Aunt   . Anxiety disorder Maternal Aunt   . Diabetes Paternal Aunt   . Cancer Paternal Uncle        spinal cancer  . Hernia Son   . Ataxia Neg Hx   . [place] Neg Hx   . Dementia Neg Hx   . Mental retardation Neg Hx   . Multiple sclerosis Neg Hx   . Neurofibromatosis Neg Hx   . Neuropathy Neg Hx   . Parkinsonism Neg Hx   . Seizures Neg Hx   . Stroke Neg Hx   . Depression Neg Hx   . Bipolar disorder Neg Hx    Allergies  Allergen Reactions  . Penicillins Hives, Itching and Rash    Did it involve swelling of the face/tongue/throat, SOB, or low BP? No Did it involve sudden or severe rash/hives, skin peeling, or any reaction on the inside of your mouth or nose? No Did you need to seek medical attention at a hospital or doctor's office? No When did it last happen?childhood allergy If all above answers are "NO", may proceed with cephalosporin use.   Marland Kitchen Amoxil [Amoxicillin] Itching and Rash    Did it involve swelling of the face/tongue/throat, SOB, or low BP? No Did it involve sudden or severe rash/hives, skin peeling, or any reaction on the inside of your mouth or nose? No Did you need to seek medical attention at a hospital or doctor's office? No When did it last happen?2019 If all above answers are "NO", may proceed with cephalosporin use.    Patient Care Team: Paulene Floor as PCP - General (Physician Assistant) Vern Claude, LCSW as Social Worker   Medications: Outpatient Medications Prior to Visit  Medication Sig  . cyclobenzaprine (FLEXERIL) 10 MG tablet Take 10 mg by mouth 3 (three) times daily.  . fluticasone (FLONASE) 50 MCG/ACT nasal spray Place 1 spray into both nostrils daily.  Marland Kitchen loratadine (CLARITIN) 10 MG tablet Take 10 mg by mouth daily.  Marland Kitchen omeprazole (PRILOSEC) 40 MG capsule Take 1 capsule (40 mg total) by mouth daily.  . promethazine-dextromethorphan (PROMETHAZINE-DM) 6.25-15 MG/5ML syrup Take 5 mLs by  mouth at bedtime as needed.  . SUMAtriptan (IMITREX) 50 MG tablet May repeat in 2 hours if headache persists or recurs.   No facility-administered medications prior to visit.    Review of Systems  Constitutional:  Positive for fatigue.  HENT: Positive for tinnitus.   Eyes: Positive for photophobia, pain and itching.  Respiratory: Negative.   Cardiovascular: Positive for leg swelling (left ankle due to a fall per pt).  Gastrointestinal: Negative.   Endocrine: Negative.   Genitourinary: Negative.   Musculoskeletal: Positive for arthralgias, back pain and joint swelling.  Skin: Negative.   Allergic/Immunologic: Positive for environmental allergies.  Neurological: Positive for dizziness and headaches.  Hematological: Negative.   Psychiatric/Behavioral: Positive for sleep disturbance.      Objective    BP 128/88 (BP Location: Left Arm, Patient Position: Sitting, Cuff Size: Normal)   Pulse (!) 105   Temp (!) 97.3 F (36.3 C) (Temporal)   Ht 4' 10"  (1.473 m)   Wt 192 lb 6.4 oz (87.3 kg)   LMP 10/18/2019 (Exact Date)   SpO2 97%   BMI 40.21 kg/m    Physical Exam Constitutional:      Appearance: Normal appearance.  HENT:     Right Ear: Tympanic membrane, ear canal and external ear normal.     Left Ear: Tympanic membrane, ear canal and external ear normal.  Cardiovascular:     Rate and Rhythm: Normal rate and regular rhythm.     Pulses: Normal pulses.     Heart sounds: Normal heart sounds.  Pulmonary:     Effort: Pulmonary effort is normal.     Breath sounds: Normal breath sounds.  Abdominal:     General: Abdomen is flat. Bowel sounds are normal.     Palpations: Abdomen is soft.  Skin:    General: Skin is warm and dry.  Neurological:     General: No focal deficit present.     Mental Status: She is alert and oriented to person, place, and time.  Psychiatric:        Mood and Affect: Mood normal.        Behavior: Behavior normal.       Last depression screening  scores PHQ 2/9 Scores 11/11/2019 09/17/2019 04/30/2019  PHQ - 2 Score 1 0 0  PHQ- 9 Score 7 6 2    Last fall risk screening Fall Risk  11/11/2019  Falls in the past year? 1  Number falls in past yr: 0  Injury with Fall? 1  Comment Ankle   Last Audit-C alcohol use screening Alcohol Use Disorder Test (AUDIT) 11/11/2019  1. How often do you have a drink containing alcohol? 1  2. How many drinks containing alcohol do you have on a typical day when you are drinking? 0  3. How often do you have six or more drinks on one occasion? 0  AUDIT-C Score 1  Alcohol Brief Interventions/Follow-up -   A score of 3 or more in women, and 4 or more in men indicates increased risk for alcohol abuse, EXCEPT if all of the points are from question 1   Results for orders placed or performed in visit on 11/11/19  Cytology - PAP  Result Value Ref Range   Chlamydia Negative    Neisseria Gonorrhea Negative    Adequacy      Satisfactory for evaluation; transformation zone component ABSENT.   Diagnosis      - Negative for intraepithelial lesion or malignancy (NILM)   Comment Normal Reference Ranger Chlamydia - Negative    Comment      Normal Reference Range Neisseria Gonorrhea - Negative    Assessment & Plan    Routine Health Maintenance and Physical Exam  Exercise Activities and Dietary  recommendations Goals   None     Immunization History  Administered Date(s) Administered  . DTaP 11/07/1995, 03/10/1996, 04/15/1996, 03/31/1997, 06/29/2000  . HPV Quadrivalent 09/12/2006, 12/14/2006, 03/05/2007  . Hepatitis A, Ped/Adol-2 Dose 09/12/2006, 03/05/2007, 03/14/2013  . Hepatitis B, ped/adol 08/16/1995, 11/07/1995, 04/15/1996  . IPV 11/07/1995, 03/10/1996, 04/15/1996, 06/29/2000  . Influenza Nasal 02/05/2009  . Influenza,inj,Quad PF,6+ Mos 01/18/2018  . MMR 09/03/1996, 06/29/2000, 08/10/2018  . Meningococcal Conjugate 09/12/2006  . Meningococcal Polysaccharide 11/30/2011  . PFIZER SARS-COV-2  Vaccination 11/11/2019  . Td 09/12/2006  . Tdap 09/12/2006, 04/04/2017, 05/10/2018    Health Maintenance  Topic Date Due  . Hepatitis C Screening  Never done  . INFLUENZA VACCINE  11/30/2019  . COVID-19 Vaccine (2 - Pfizer 2-dose series) 12/02/2019  . PAP-Cervical Cytology Screening  11/11/2022  . PAP SMEAR-Modifier  11/11/2022  . TETANUS/TDAP  05/10/2028  . HIV Screening  Completed    Discussed health benefits of physical activity, and encouraged her to engage in regular exercise appropriate for her age and condition.  1. Annual physical exam   2. Screening for cervical cancer  - Cytology - PAP  3. Morbid obesity ([place])  Discussed importance of healthy weight management Discussed diet and exercise    Return in about 1 year (around 11/10/2020).     ITrinna Post, PA-C, have reviewed all documentation for this visit. The documentation on 11/14/19 for the exam, diagnosis, procedures, and orders are all accurate and complete.    Paulene Floor  [facility] [phone] (phone) [phone] (fax)  [place]

## 2019-11-11 ENCOUNTER — Ambulatory Visit: Payer: Medicaid Other | Attending: Internal Medicine

## 2019-11-11 ENCOUNTER — Other Ambulatory Visit: Payer: Self-pay

## 2019-11-11 ENCOUNTER — Encounter: Payer: Self-pay | Admitting: Physician Assistant

## 2019-11-11 ENCOUNTER — Ambulatory Visit: Payer: Medicaid Other | Admitting: Physician Assistant

## 2019-11-11 ENCOUNTER — Other Ambulatory Visit (HOSPITAL_COMMUNITY)
Admission: RE | Admit: 2019-11-11 | Discharge: 2019-11-11 | Disposition: A | Discharge status: Home / Self Care | Payer: Medicaid Other | Source: Ambulatory Visit | Attending: Physician Assistant | Admitting: Physician Assistant

## 2019-11-11 VITALS — BP 128/88 | HR 105 | Temp 97.3°F | Ht <= 58 in | Wt 192.4 lb

## 2019-11-11 DIAGNOSIS — Z124 Encounter for screening for malignant neoplasm of cervix: Secondary | ICD-10-CM

## 2019-11-11 DIAGNOSIS — Z0000 Encounter for general adult medical examination without abnormal findings: Secondary | ICD-10-CM | POA: Diagnosis not present

## 2019-11-11 DIAGNOSIS — Z23 Encounter for immunization: Secondary | ICD-10-CM

## 2019-11-11 NOTE — Progress Notes (Signed)
° °  Covid-19 Vaccination Clinic  Name:  Martha Herrera    MRN: [id number] DOB: [birth year]  11/11/2019  Ms. [name] was observed post Covid-19 immunization for 15 minutes without incident. She was provided with Vaccine Information Sheet and instruction to access the V-Safe system.   Ms. [name] was instructed to call 911 with any severe reactions post vaccine:  Difficulty breathing   Swelling of face and throat   A fast heartbeat   A bad rash all over body   Dizziness and weakness   Immunizations Administered    Name Date Dose VIS Date Route   Pfizer COVID-19 Vaccine 11/11/2019 11:59 AM 0.3 mL 06/25/2018 Intramuscular   Manufacturer: [employer], [employer]   Lot: [id number]   NDC: [address]-9

## 2019-11-12 LAB — CYTOLOGY - PAP
Adequacy: ABSENT
Chlamydia: NEGATIVE
Comment: NEGATIVE
Comment: NORMAL
Diagnosis: NEGATIVE
Neisseria Gonorrhea: NEGATIVE

## 2019-11-24 ENCOUNTER — Telehealth: Payer: Self-pay

## 2019-11-24 DIAGNOSIS — S86112A Strain of other muscle(s) and tendon(s) of posterior muscle group at lower leg level, left leg, initial encounter: Secondary | ICD-10-CM

## 2019-11-24 NOTE — Telephone Encounter (Signed)
MRI has been approved from 11/21/2019 to 01/19/2020  Auth # [id number]  Patient has been notified of approval and will call scheduling department to set up MRI appt to her convenience.

## 2019-12-04 ENCOUNTER — Ambulatory Visit: Payer: Medicaid Other

## 2019-12-08 ENCOUNTER — Telehealth: Payer: Self-pay | Admitting: Podiatry

## 2019-12-09 ENCOUNTER — Ambulatory Visit: Admission: RE | Admit: 2019-12-09 | Payer: Medicaid Other | Source: Ambulatory Visit

## 2019-12-10 ENCOUNTER — Ambulatory Visit: Payer: Medicaid Other | Admitting: Podiatry

## 2019-12-12 DIAGNOSIS — H5213 Myopia, bilateral: Secondary | ICD-10-CM | POA: Diagnosis not present

## 2019-12-26 ENCOUNTER — Ambulatory Visit
Admission: RE | Admit: 2019-12-26 | Discharge: 2019-12-26 | Disposition: A | Discharge status: Home / Self Care | Payer: Medicaid Other | Source: Ambulatory Visit | Attending: Podiatry | Admitting: Podiatry

## 2019-12-26 ENCOUNTER — Other Ambulatory Visit: Payer: Self-pay

## 2019-12-26 DIAGNOSIS — S86112A Strain of other muscle(s) and tendon(s) of posterior muscle group at lower leg level, left leg, initial encounter: Secondary | ICD-10-CM | POA: Diagnosis not present

## 2020-01-05 ENCOUNTER — Encounter: Payer: Self-pay | Admitting: Physician Assistant

## 2020-01-07 ENCOUNTER — Ambulatory Visit: Payer: Medicaid Other | Admitting: Physician Assistant

## 2020-01-07 ENCOUNTER — Encounter: Payer: Self-pay | Admitting: Physician Assistant

## 2020-01-07 ENCOUNTER — Other Ambulatory Visit: Payer: Self-pay

## 2020-01-07 VITALS — BP 120/82 | HR 102 | Temp 97.7°F | Ht <= 58 in | Wt 189.0 lb

## 2020-01-07 DIAGNOSIS — M2550 Pain in unspecified joint: Secondary | ICD-10-CM

## 2020-01-07 NOTE — Progress Notes (Signed)
Established patient visit   Patient: Martha Herrera   DOB: 1995-09-21   24 y.o. Female  MRN: [id number] Visit Date: 01/07/2020  Today's healthcare provider: Trinna Post, PA-C   Chief Complaint  Patient presents with  . Knee Pain   Subjective      Patient presents today with left knee pain. She denies any injuries. She has also noticed some swelling in her left knee. She has been taking Meloxicam with no relief. She has reported knee pain for years. She reports doing PT before which helped temporarily. More recently this has been compounded by left ankle pain after a fall. She has seen podiatry for this and underwent MRI which was normal. She continues to have left knee pain and swelling. Left knee xray 08/2019 shows anterior soft tissue swelling but no bony abnormality. She takes meloxicam with some relief. She is concerned about inflammatory arthritis.      Medications: Outpatient Medications Prior to Visit  Medication Sig  . fluticasone (FLONASE) 50 MCG/ACT nasal spray Place 1 spray into both nostrils daily.  Marland Kitchen loratadine (CLARITIN) 10 MG tablet Take 10 mg by mouth daily.  Marland Kitchen omeprazole (PRILOSEC) 40 MG capsule Take 1 capsule (40 mg total) by mouth daily.  . promethazine-dextromethorphan (PROMETHAZINE-DM) 6.25-15 MG/5ML syrup Take 5 mLs by mouth at bedtime as needed.  . SUMAtriptan (IMITREX) 50 MG tablet May repeat in 2 hours if headache persists or recurs.  . cyclobenzaprine (FLEXERIL) 10 MG tablet Take 10 mg by mouth 3 (three) times daily. (Patient not taking: Reported on 01/07/2020)   No facility-administered medications prior to visit.    Review of Systems  Constitutional: Negative.   Cardiovascular: Positive for leg swelling.  Musculoskeletal: Positive for arthralgias, joint swelling and myalgias.      Objective    BP 120/82   Pulse (!) 102   Temp 97.7 F (36.5 C)   Ht 4' 10" (1.473 m)   Wt 189 lb (85.7 kg)   LMP 12/12/2019   BMI 39.50 kg/m    Physical  Exam Constitutional:      Appearance: Normal appearance.  Musculoskeletal:     Right hand: Normal. No swelling or deformity.     Left hand: Normal. No swelling or deformity.     Right knee: Normal.     Left knee: Swelling present. No deformity, erythema or bony tenderness.  Skin:    General: Skin is warm and dry.  Neurological:     Mental Status: She is alert and oriented to person, place, and time. Mental status is at baseline.       Results for orders placed or performed in visit on 01/07/20  Comprehensive Metabolic Panel (CMET)  Result Value Ref Range   Glucose 90 65 - 99 mg/dL   BUN 16 6 - 20 mg/dL   Creatinine, Ser 0.86 0.57 - 1.00 mg/dL   GFR calc non Af Amer 95 >59 mL/min/1.73   GFR calc Af Amer 109 >59 mL/min/1.73   BUN/Creatinine Ratio 19 9 - 23   Sodium 141 134 - 144 mmol/L   Potassium 3.5 3.5 - 5.2 mmol/L   Chloride 102 96 - 106 mmol/L   CO2 24 20 - 29 mmol/L   Calcium 9.5 8.7 - 10.2 mg/dL   Total Protein 6.9 6.0 - 8.5 g/dL   Albumin 4.7 3.9 - 5.0 g/dL   Globulin, Total 2.2 1.5 - 4.5 g/dL   Albumin/Globulin Ratio 2.1 1.2 - 2.2   Bilirubin Total  0.5 0.0 - 1.2 mg/dL   Alkaline Phosphatase 112 48 - 121 IU/L   AST 14 0 - 40 IU/L   ALT 16 0 - 32 IU/L  CBC with Differential  Result Value Ref Range   WBC 11.0 (H) 3.4 - 10.8 x10E3/uL   RBC 5.20 3.77 - 5.28 x10E6/uL   Hemoglobin 14.3 11.1 - 15.9 g/dL   Hematocrit 42.4 34.0 - 46.6 %   MCV 82 79 - 97 fL   MCH 27.5 26.6 - 33.0 pg   MCHC 33.7 31 - 35 g/dL   RDW 12.3 11.7 - 15.4 %   Platelets 310 150 - 450 x10E3/uL   Neutrophils 64 Not Estab. %   Lymphs 29 Not Estab. %   Monocytes 5 Not Estab. %   Eos 1 Not Estab. %   Basos 1 Not Estab. %   Neutrophils Absolute 7.1 (H) 1 - 7 x10E3/uL   Lymphocytes Absolute 3.2 (H) 0 - 3 x10E3/uL   Monocytes Absolute 0.5 0 - 0 x10E3/uL   EOS (ABSOLUTE) 0.1 0.0 - 0.4 x10E3/uL   Basophils Absolute 0.1 0 - 0 x10E3/uL   Immature Granulocytes 0 Not Estab. %   Immature Grans (Abs)  0.0 0.0 - 0.1 x10E3/uL  Sed Rate (ESR)  Result Value Ref Range   Sed Rate 21 0 - 32 mm/hr  C-reactive protein  Result Value Ref Range   CRP 11 (H) 0 - 10 mg/L  Rheumatoid Factor  Result Value Ref Range   Rhuematoid fact SerPl-aCnc <10.0 0.0 - 13.9 IU/mL  ANA  Result Value Ref Range   ANA Titer 1 Negative    Please Note: Comment     Assessment & Plan    1. Polyarthralgia  Discussed conservative management, including meloxicam, PT. Patient concerned about inflammatory arthritis and so will test as below.   - Comprehensive Metabolic Panel (CMET) - CBC with Differential - Sed Rate (ESR) - C-reactive protein - Rheumatoid Factor - ANA - Ambulatory referral to Physical Therapy    No follow-ups on file.      ITrinna Post, PA-C, have reviewed all documentation for this visit. The documentation on 01/13/20 for the exam, diagnosis, procedures, and orders are all accurate and complete.   I spent 20 minutes dedicated to the care of this patient on the date of this encounter to include pre-visit review of records, face-to-face time with the patient discussing inflammatory arthritis, and post visit ordering of testing.  Paulene Floor  [facility] [phone] (phone) [phone] (fax)  [place]

## 2020-01-07 NOTE — Patient Instructions (Signed)

## 2020-01-09 ENCOUNTER — Telehealth: Payer: Self-pay

## 2020-01-09 ENCOUNTER — Encounter: Payer: Self-pay | Admitting: Physician Assistant

## 2020-01-09 ENCOUNTER — Other Ambulatory Visit: Payer: Self-pay | Admitting: Physician Assistant

## 2020-01-09 DIAGNOSIS — M2550 Pain in unspecified joint: Secondary | ICD-10-CM

## 2020-01-09 LAB — COMPREHENSIVE METABOLIC PANEL
ALT: 16 IU/L (ref 0–32)
AST: 14 IU/L (ref 0–40)
Albumin/Globulin Ratio: 2.1 (ref 1.2–2.2)
Albumin: 4.7 g/dL (ref 3.9–5.0)
Alkaline Phosphatase: 112 IU/L (ref 48–121)
BUN/Creatinine Ratio: 19 (ref 9–23)
BUN: 16 mg/dL (ref 6–20)
Bilirubin Total: 0.5 mg/dL (ref 0.0–1.2)
CO2: 24 mmol/L (ref 20–29)
Calcium: 9.5 mg/dL (ref 8.7–10.2)
Chloride: 102 mmol/L (ref 96–106)
Creatinine, Ser: 0.86 mg/dL (ref 0.57–1.00)
GFR calc Af Amer: 109 mL/min/{1.73_m2} (ref >59)
GFR calc non Af Amer: 95 mL/min/{1.73_m2} (ref >59)
Globulin, Total: 2.2 g/dL (ref 1.5–4.5)
Glucose: 90 mg/dL (ref 65–99)
Potassium: 3.5 mmol/L (ref 3.5–5.2)
Sodium: 141 mmol/L (ref 134–144)
Total Protein: 6.9 g/dL (ref 6.0–8.5)

## 2020-01-09 LAB — CBC WITH DIFFERENTIAL/PLATELET
Basophils Absolute: 0.1 10*3/uL (ref 0.0–0.2)
Basos: 1 %
EOS (ABSOLUTE): 0.1 10*3/uL (ref 0.0–0.4)
Eos: 1 %
Hematocrit: 42.4 % (ref 34.0–46.6)
Hemoglobin: 14.3 g/dL (ref 11.1–15.9)
Immature Grans (Abs): 0 10*3/uL (ref 0.0–0.1)
Immature Granulocytes: 0 %
Lymphocytes Absolute: 3.2 10*3/uL — ABNORMAL HIGH (ref 0.7–3.1)
Lymphs: 29 %
MCH: 27.5 pg (ref 26.6–33.0)
MCHC: 33.7 g/dL (ref 31.5–35.7)
MCV: 82 fL (ref 79–97)
Monocytes Absolute: 0.5 10*3/uL (ref 0.1–0.9)
Monocytes: 5 %
Neutrophils Absolute: 7.1 10*3/uL — ABNORMAL HIGH (ref 1.4–7.0)
Neutrophils: 64 %
Platelets: 310 10*3/uL (ref 150–450)
RBC: 5.2 x10E6/uL (ref 3.77–5.28)
RDW: 12.3 % (ref 11.7–15.4)
WBC: 11 10*3/uL — ABNORMAL HIGH (ref 3.4–10.8)

## 2020-01-09 LAB — C-REACTIVE PROTEIN: CRP: 11 mg/L — ABNORMAL HIGH (ref 0–10)

## 2020-01-09 LAB — RHEUMATOID FACTOR: Rheumatoid fact SerPl-aCnc: <10 IU/mL (ref 0.0–13.9)

## 2020-01-09 LAB — ANA: ANA Titer 1: NEGATIVE

## 2020-01-09 LAB — SEDIMENTATION RATE: Sed Rate: 21 mm/hr (ref 0–32)

## 2020-01-09 NOTE — Telephone Encounter (Signed)
Request for MRI disc has been faxed to [facility] radiology. Patient has been notified of delay.  She will call back next week to schedule follow up with Dr. Al Corpus

## 2020-01-09 NOTE — Telephone Encounter (Signed)
Patient send a MyChart message as well to provider.

## 2020-01-09 NOTE — Telephone Encounter (Signed)
Copied from CRM [id number]. Topic: General - Other >> Jan 09, 2020 11:20 AM Dalphine Handing A wrote: Patient requesting callback to go over lab resutls

## 2020-01-09 NOTE — Telephone Encounter (Signed)
Please have provider review and advise. KW

## 2020-01-09 NOTE — Telephone Encounter (Signed)
-----   Message from Martha Herrera, [place] sent at 12/29/2019  7:37 AM EDT ----- Please send for an over read and inform patient of the delay.

## 2020-01-12 ENCOUNTER — Ambulatory Visit: Payer: Medicaid Other | Admitting: Podiatry

## 2020-01-13 ENCOUNTER — Telehealth: Payer: Self-pay

## 2020-01-13 NOTE — Telephone Encounter (Signed)
Copied from CRM #[id number]. Topic: Medical Record Request - Other °>> Jan 13, 2020  3:07 PM Robinson, Norma J wrote: °Patient Name/DOB/MRN #: [id number] Requestor Name/Agency: self °Call Back #:[phone]  Information Requested:pt would like a copy of her immunization ° °Route to [facility] HIM Pool for [place] clinics. For all other clinics, route to the clinic's PEC Pool. °

## 2020-01-14 NOTE — Telephone Encounter (Signed)
Copied from CRM [id number]. Topic: Medical Record Request - Other >> Jan 13, 2020  3:07 PM Leafy Ro wrote: Patient Name/DOB/MRN #: [id number] Requestor Name/Agency: self Call Back #[phone]  Information Requested:pt would like a copy of her immunization  Route to [facility] HIM Pool for [place] clinics. For all other clinics, route to the clinic's PEC Pool.

## 2020-01-15 ENCOUNTER — Telehealth: Payer: Self-pay

## 2020-01-15 NOTE — Telephone Encounter (Signed)
-----   Message from Max T Hyatt, DPM sent at 12/29/2019  7:37 AM EDT ----- °Please send for an over read and inform patient of the delay. °

## 2020-01-15 NOTE — Telephone Encounter (Signed)
Mailed MRI disc and report to [facility] 

## 2020-01-19 NOTE — Telephone Encounter (Signed)
Patient was advised that a copy of her immunizations was left up front for her to pick up.

## 2020-01-27 ENCOUNTER — Encounter: Payer: Self-pay | Admitting: Physician Assistant

## 2020-01-28 ENCOUNTER — Ambulatory Visit: Payer: Medicaid Other | Admitting: Physician Assistant

## 2020-01-28 NOTE — Progress Notes (Deleted)
     Established patient visit   Patient: Martha Herrera   DOB: May 13, 1995   24 y.o. Female  MRN: [id number] Visit Date: 01/29/2020  Today's healthcare provider: Trey Sailors, PA-C   No chief complaint on file.  Subjective    HPI  Ingrown Toenail  {Show patient history (optional):23778::" "}   Medications: Outpatient Medications Prior to Visit  Medication Sig  . cyclobenzaprine (FLEXERIL) 10 MG tablet Take 10 mg by mouth 3 (three) times daily. (Patient not taking: Reported on 01/07/2020)  . fluticasone (FLONASE) 50 MCG/ACT nasal spray Place 1 spray into both nostrils daily.  Marland Kitchen loratadine (CLARITIN) 10 MG tablet Take 10 mg by mouth daily.  Marland Kitchen omeprazole (PRILOSEC) 40 MG capsule Take 1 capsule (40 mg total) by mouth daily.  . promethazine-dextromethorphan (PROMETHAZINE-DM) 6.25-15 MG/5ML syrup Take 5 mLs by mouth at bedtime as needed.  . SUMAtriptan (IMITREX) 50 MG tablet May repeat in 2 hours if headache persists or recurs.   No facility-administered medications prior to visit.    Review of Systems  {Heme  Chem  Endocrine  Serology  Results Review (optional):23779::" "}  Objective    There were no vitals taken for this visit. {Show previous vital signs (optional):23777::" "}  Physical Exam  ***  No results found for any visits on 01/29/20.  Assessment & Plan     ***  No follow-ups on file.      {provider attestation***:1}   Maryella Shivers  [facility] [phone] (phone) [phone] (fax)  [facility]

## 2020-01-29 ENCOUNTER — Ambulatory Visit: Payer: Medicaid Other | Admitting: Physician Assistant

## 2020-03-02 ENCOUNTER — Telehealth (INDEPENDENT_AMBULATORY_CARE_PROVIDER_SITE_OTHER): Payer: Medicaid Other | Admitting: Physician Assistant

## 2020-03-02 DIAGNOSIS — L709 Acne, unspecified: Secondary | ICD-10-CM | POA: Diagnosis not present

## 2020-03-02 DIAGNOSIS — R42 Dizziness and giddiness: Secondary | ICD-10-CM | POA: Diagnosis not present

## 2020-03-02 MED ORDER — MECLIZINE HCL 25 MG PO TABS: 25.0000 mg | ORAL_TABLET | Freq: Three times a day (TID) | ORAL | 0 refills | Status: DC | PRN

## 2020-03-02 MED ORDER — ADAPALENE 0.1 % EX GEL: Freq: Every day | CUTANEOUS | 0 refills | Status: DC

## 2020-03-02 MED ORDER — ONDANSETRON HCL 4 MG PO TABS: 4.0000 mg | ORAL_TABLET | Freq: Three times a day (TID) | ORAL | 0 refills | Status: DC | PRN

## 2020-03-02 NOTE — Patient Instructions (Addendum)
Benzoyl peroxide  differin gel 0.1%   How to Perform the Epley Maneuver The Epley maneuver is an exercise that relieves symptoms of vertigo. Vertigo is the feeling that you or your surroundings are moving when they are not. When you feel vertigo, you may feel like the room is spinning and have trouble walking. Dizziness is a little different than vertigo. When you are dizzy, you may feel unsteady or light-headed. You can do this maneuver at home whenever you have symptoms of vertigo. You can do it up to 3 times a day until your symptoms go away. Even though the Epley maneuver may relieve your vertigo for a few weeks, it is possible that your symptoms will return. This maneuver relieves vertigo, but it does not relieve dizziness. What are the risks? If it is done correctly, the Epley maneuver is considered safe. Sometimes it can lead to dizziness or nausea that goes away after a short time. If you develop other symptoms, such as changes in vision, weakness, or numbness, stop doing the maneuver and call your health care provider. How to perform the Epley maneuver 1. Sit on the edge of a bed or table with your back straight and your legs extended or hanging over the edge of the bed or table. 2. Turn your head halfway toward the affected ear or side. 3. Lie backward quickly with your head turned until you are lying flat on your back. You may want to position a pillow under your shoulders. 4. Hold this position for 30 seconds. You may experience an attack of vertigo. This is normal. 5. Turn your head to the opposite direction until your unaffected ear is facing the floor. 6. Hold this position for 30 seconds. You may experience an attack of vertigo. This is normal. Hold this position until the vertigo stops. 7. Turn your whole body to the same side as your head. Hold for another 30 seconds. 8. Sit back up. You can repeat this exercise up to 3 times a day. Follow these instructions at home:  After  doing the Epley maneuver, you can return to your normal activities.  Ask your health care provider if there is anything you should do at home to prevent vertigo. He or she may recommend that you: ? Keep your head raised (elevated) with two or more pillows while you sleep. ? Do not sleep on the side of your affected ear. ? Get up slowly from bed. ? Avoid sudden movements during the day. ? Avoid extreme head movement, like looking up or bending over. Contact a health care provider if:  Your vertigo gets worse.  You have other symptoms, including: ? Nausea. ? Vomiting. ? Headache. Get help right away if:  You have vision changes.  You have a severe or worsening headache or neck pain.  You cannot stop vomiting.  You have new numbness or weakness in any part of your body. Summary  Vertigo is the feeling that you or your surroundings are moving when they are not.  The Epley maneuver is an exercise that relieves symptoms of vertigo.  If the Epley maneuver is done correctly, it is considered safe. You can do it up to 3 times a day. This information is not intended to replace advice given to you by your health care provider. Make sure you discuss any questions you have with your health care provider. Document Revised: 03/30/2017 Document Reviewed: 03/07/2016 Elsevier Patient Education  2020 [employer].

## 2020-03-02 NOTE — Progress Notes (Signed)
MyChart Video Visit    Virtual Visit via Video Note   This visit type was conducted due to national recommendations for restrictions regarding the COVID-19 Pandemic (e.g. social distancing) in an effort to limit this patient's exposure and mitigate transmission in our community. This patient is at least at moderate risk for complications without adequate follow up. This format is felt to be most appropriate for this patient at this time. Physical exam was limited by quality of the video and audio technology used for the visit.   Patient location: Home Provider location: Office   I discussed the limitations of evaluation and management by telemedicine and the availability of in person appointments. The patient expressed understanding and agreed to proceed.  Patient: Martha Herrera   DOB: 19-Nov-1995   24 y.o. Female  MRN: [id number] Visit Date: 03/02/2020  Today's healthcare provider: Trey Sailors, PA-C   Chief Complaint  Patient presents with  . Dizziness  I,Isatu Macinnes M Lior Cartelli,acting as a scribe for Trey Sailors, PA-C.,have documented all relevant documentation on the behalf of Trey Sailors, PA-C,as directed by  Trey Sailors, PA-C while in the presence of Trey Sailors, PA-C.  Subjective    Dizziness This is a new problem. The current episode started in the past 7 days. The problem occurs 2 to 4 times per day. The problem has been unchanged. Associated symptoms include headaches. Pertinent negatives include no chest pain, chills, fatigue, fever, vertigo, visual change or weakness. Nothing aggravates the symptoms. She has tried nothing for the symptoms. The treatment provided no relief.    She reports some dizziness and room spinning sensation after she gets off the elevator. She has felt some nausea without vomiting. She denies hearing loss. She denies tried ibuprofen and tylenol without relief. She reports the episodes lasts 30 seconds. She has some typical headaches  but nothing new or unusual for her.   She also reports some increased acne breakouts. She reports salicylic acid was too hard on her skin previously.     Medications: Outpatient Medications Prior to Visit  Medication Sig  . loratadine (CLARITIN) 10 MG tablet Take 10 mg by mouth daily.  Marland Kitchen omeprazole (PRILOSEC) 40 MG capsule Take 1 capsule (40 mg total) by mouth daily.  . SUMAtriptan (IMITREX) 50 MG tablet May repeat in 2 hours if headache persists or recurs.  . [DISCONTINUED] cyclobenzaprine (FLEXERIL) 10 MG tablet Take 10 mg by mouth 3 (three) times daily. (Patient not taking: Reported on 01/07/2020)  . [DISCONTINUED] fluticasone (FLONASE) 50 MCG/ACT nasal spray Place 1 spray into both nostrils daily.  . [DISCONTINUED] promethazine-dextromethorphan (PROMETHAZINE-DM) 6.25-15 MG/5ML syrup Take 5 mLs by mouth at bedtime as needed.   No facility-administered medications prior to visit.    Review of Systems  Constitutional: Negative for chills, fatigue and fever.  HENT: Negative for sinus pressure, sinus pain and trouble swallowing.   Respiratory: Negative for chest tightness, shortness of breath and wheezing.   Cardiovascular: Negative for chest pain and palpitations.  Neurological: Positive for dizziness, light-headedness and headaches. Negative for vertigo and weakness.      Objective    There were no vitals taken for this visit.   Physical Exam     Assessment & Plan    1. Vertigo  Suspect BPPV, discussed epley maneuvers and nausea. Also consider Meniere's and recommend f/u with ENT if not improving.   - meclizine (ANTIVERT) 25 MG tablet; Take 1 tablet (25 mg total) by mouth 3 (  three) times daily as needed for dizziness.  Dispense: 30 tablet; Refill: 0 - ondansetron (ZOFRAN) 4 MG tablet; Take 1 tablet (4 mg total) by mouth every 8 (eight) hours as needed for nausea or vomiting.  Dispense: 20 tablet; Refill: 0  2. Acne, unspecified acne type  Recommend benzoyl peroxide and  may also try differin gel.   - adapalene (DIFFERIN) 0.1 % gel; Apply topically at bedtime.  Dispense: 45 g; Refill: 0   Return if symptoms worsen or fail to improve.     I discussed the assessment and treatment plan with the patient. The patient was provided an opportunity to ask questions and all were answered. The patient agreed with the plan and demonstrated an understanding of the instructions.   The patient was advised to call back or seek an in-person evaluation if the symptoms worsen or if the condition fails to improve as anticipated.   ITrey Sailors, PA-C, have reviewed all documentation for this visit. The documentation on 03/02/20 for the exam, diagnosis, procedures, and orders are all accurate and complete.  The entirety of the information documented in the History of Present Illness, Review of Systems and Physical Exam were personally obtained by me. Portions of this information were initially documented by [facility] and reviewed by me for thoroughness and accuracy.    Maryella Shivers [facility] [phone] (phone) [phone] (fax)  [facility]

## 2020-03-19 ENCOUNTER — Encounter: Payer: Self-pay | Admitting: Physician Assistant

## 2020-03-27 ENCOUNTER — Other Ambulatory Visit: Payer: Self-pay | Admitting: Physician Assistant

## 2020-03-27 ENCOUNTER — Other Ambulatory Visit: Payer: Self-pay

## 2020-03-27 ENCOUNTER — Emergency Department (HOSPITAL_COMMUNITY)
Admission: EM | Admit: 2020-03-27 | Discharge: 2020-03-27 | Disposition: A | Discharge status: Home / Self Care | Payer: No Typology Code available for payment source | Attending: Emergency Medicine | Admitting: Emergency Medicine

## 2020-03-27 ENCOUNTER — Emergency Department (HOSPITAL_COMMUNITY): Payer: No Typology Code available for payment source

## 2020-03-27 ENCOUNTER — Encounter (HOSPITAL_COMMUNITY): Payer: Self-pay | Admitting: Emergency Medicine

## 2020-03-27 DIAGNOSIS — M25561 Pain in right knee: Secondary | ICD-10-CM | POA: Diagnosis not present

## 2020-03-27 DIAGNOSIS — M25562 Pain in left knee: Secondary | ICD-10-CM

## 2020-03-27 MED ORDER — HYDROCODONE-ACETAMINOPHEN 5-325 MG PO TABS: 1.0000 | ORAL_TABLET | ORAL | 0 refills | Status: DC | PRN

## 2020-03-27 NOTE — ED Notes (Signed)
Pt discharged; All questions and concerns addressed. No complaints at this time.

## 2020-03-27 NOTE — Discharge Instructions (Signed)
The pain in your knee is likely from irritation of the meniscus.  To treat this use heat on the sore area 3 or 4 times a day and continue taking meloxicam daily.  Also use the knee brace as needed for comfort when you are walking.  We are giving you a short-term course of narcotic pain reliever to use for severe pain.  Do not drive or work when you are taking the pain reliever.  If you are not better in 3 to 4 days, see your primary care doctor for further evaluation and treatment.

## 2020-03-27 NOTE — ED Provider Notes (Signed)
[facility] EMERGENCY DEPARTMENT Provider Note   CSN: [id number] Arrival date & time: 03/27/20  1003     History Chief Complaint  Patient presents with  . Knee Pain    Martha Herrera is a 24 y.o. female.  HPI She complains of right knee pain for 2 weeks, worsening despite using knee brace and taking meloxicam.  No known trauma.  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  She has been taking meloxicam chronically for a left knee problem, prescribed by her PCP.  She denies fever, vomiting, dizziness or weakness.  There are no other known modifying factors.    Past Medical History:  Diagnosis Date  . Complication of anesthesia    hard to wake up  . Gastroesophageal reflux    takes prescribed meds daily  . Gestational diabetes   . Headache(784.0)    not as often  . Heart murmur   . Pneumonia 2013  . Varicella     Patient Active Problem List   Diagnosis Date Noted  . Encounter for IUD insertion 09/16/2018  . S/P cesarean section 08/07/2018  . History of gestational diabetes 05/13/2018  . Tension headache 01/13/2013  . Migraine without aura 01/13/2013  . Cardiac murmur 07/25/2012  . 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 Substernal chest pain 03/12/2012  . Gastroesophageal reflux     Past Surgical History:  Procedure Laterality Date  . CESAREAN SECTION N/A 08/07/2018   Procedure: CESAREAN SECTION;  Surgeon: Lazaro Arms, MD;  Location: [facility] LD ORS;  Service: Obstetrics;  Laterality: N/A;  . ESOPHAGOGASTRODUODENOSCOPY  05/03/2012   Procedure: ESOPHAGOGASTRODUODENOSCOPY (EGD);  Surgeon: Jon Gills, MD;  Location: [facility] OR;  Service: Gastroenterology;  Laterality: N/A;  . tubes in ears    . WISDOM TOOTH EXTRACTION       OB History    Gravida  1   Para  1   Term  1   Preterm  0   AB  0   Living  1     SAB  0   TAB  0   Ectopic  0   Multiple  0   Live Births  1           Family History  Problem Relation Age of  Onset  . GER disease Father   . Cancer Father        kidney  . Asthma Brother   . Miscarriages / [place] Brother   . GER disease Maternal Grandmother   . Hyperlipidemia Maternal Grandmother   . ADD / ADHD Brother   . Miscarriages / [place] Mother   . Headache Mother   . Cancer Maternal Grandfather   . Diabetes Maternal Grandfather   . Hyperlipidemia Maternal Grandfather   . Vision loss Maternal Grandfather   . GER disease Maternal Aunt   . Migraines Maternal Aunt   . Anxiety disorder Maternal Aunt   . Diabetes Paternal Aunt   . Cancer Paternal Uncle        spinal cancer  . Hernia Son   . Ataxia Neg Hx   . Chorea Neg Hx   . Dementia Neg Hx   . Mental retardation Neg Hx   . Multiple sclerosis Neg Hx   . Neurofibromatosis Neg Hx   . Neuropathy Neg Hx   . Parkinsonism Neg Hx   . Seizures Neg Hx   . Stroke Neg Hx   . Depression Neg Hx   .  Bipolar disorder Neg Hx     Social History   Tobacco Use  . Smoking status: Never Smoker  . Smokeless tobacco: Never Used  Vaping Use  . Vaping Use: Never used  Substance Use Topics  . Alcohol use: No  . Drug use: No    Home Medications Prior to Admission medications   Medication Sig Start Date End Date Taking? Authorizing Provider  adapalene (DIFFERIN) 0.1 % gel Apply topically at bedtime. 03/02/20   Trey Sailors, PA-C  HYDROcodone-acetaminophen (NORCO/VICODIN) 5-325 MG tablet Take 1 tablet by mouth every 4 (four) hours as needed for moderate pain. 03/27/20   Mancel Bale, MD  loratadine (CLARITIN) 10 MG tablet Take 10 mg by mouth daily.    [provider]  meclizine (ANTIVERT) 25 MG tablet Take 1 tablet (25 mg total) by mouth 3 (three) times daily as needed for dizziness. 03/02/20   Trey Sailors, PA-C  omeprazole (PRILOSEC) 40 MG capsule Take 1 capsule (40 mg total) by mouth daily. 09/09/19   Trey Sailors, PA-C  ondansetron (ZOFRAN) 4 MG tablet Take 1 tablet (4 mg total) by mouth every 8 (eight)  hours as needed for nausea or vomiting. 03/02/20   Trey Sailors, PA-C  SUMAtriptan (IMITREX) 50 MG tablet May repeat in 2 hours if headache persists or recurs. 09/17/19   Trey Sailors, PA-C    Allergies    Penicillins and Amoxil [amoxicillin]  Review of Systems   Review of Systems  All other systems reviewed and are negative.   Physical Exam Updated Vital Signs BP 115/73   Pulse 70   Temp 97.9 F (36.6 C) (Oral)   Resp (!) 23   LMP 03/03/2020   SpO2 98%   Physical Exam Vitals and nursing note reviewed.  Constitutional:      Appearance: She is well-developed.  HENT:     Head: Normocephalic and atraumatic.     Right Ear: External ear normal.     Left Ear: External ear normal.  Eyes:     Conjunctiva/sclera: Conjunctivae normal.     Pupils: Pupils are equal, round, and reactive to light.  Neck:     Trachea: Phonation normal.  Cardiovascular:     Rate and Rhythm: Normal rate.  Pulmonary:     Effort: Pulmonary effort is normal.  Abdominal:     General: There is no distension.     Palpations: Abdomen is soft.  Musculoskeletal:     Cervical back: Normal range of motion and neck supple.     Comments: She guards against full moving the right knee secondary to pain and cannot fully extend the knee secondary to pain.  No knee effusion.  Knee ligaments stable with stress.  No significant pain on exam.  Skin:    General: Skin is warm and dry.  Neurological:     Mental Status: She is alert and oriented to person, place, and time.     Cranial Nerves: No cranial nerve deficit.     Sensory: No sensory deficit.     Motor: No abnormal muscle tone.     Coordination: Coordination normal.  Psychiatric:        Mood and Affect: Mood normal.        Behavior: Behavior normal.        Thought Content: Thought content normal.        Judgment: Judgment normal.     ED Results / Procedures / Treatments   Labs (all labs ordered are  listed, but only abnormal results are  displayed) Labs Reviewed - No data to display  EKG None  Radiology DG Knee Complete 4 Views Right  Result Date: 03/27/2020 CLINICAL DATA:  24 year old female with right knee pain and swelling EXAM: RIGHT KNEE - COMPLETE 4+ VIEW COMPARISON:  None. FINDINGS: No evidence of fracture or malalignment. Trace knee joint effusion. No evidence of arthropathy or other focal bone abnormality. Soft tissues are unremarkable. IMPRESSION: No acute fracture or malalignment.  Trace knee joint effusion. Electronically Signed   By: Marliss Coots MD   On: 03/27/2020 10:39    Procedures Procedures (including critical care time)  Medications Ordered in ED Medications - No data to display  ED Course  I have reviewed the triage vital signs and the nursing notes.  Pertinent labs & imaging results that were available during my care of the patient were reviewed by me and considered in my medical decision making (see chart for details).    MDM Rules/Calculators/A&P                           Patient Vitals for the past 24 hrs:  BP Temp Temp src Pulse Resp SpO2  03/27/20 1115 115/73 -- -- 70 (!) 23 98 %  03/27/20 1007 127/81 97.9 F (36.6 C) Oral 72 16 98 %    11:30 AM Reevaluation with update and discussion. After initial assessment and treatment, an updated evaluation reveals no change in status, findings discussed with the patient and all questions were answered. Mancel Bale   Medical Decision Making:  This patient is presenting for evaluation of right knee pain, which does require a range of treatment options, and is a complaint that involves a moderate risk of morbidity and mortality. The differential diagnoses include contusion, sprain, internal derangement. 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  I did not require additional historical information from  anyone.   Radiologic Tests Ordered, included right knee x-ray.  I independently Visualized: Radiologic images, which show no fracture, dislocation or effusion   Critical Interventions-clinical evaluation, radiography, observation reassessment  After These Interventions, the Patient was reevaluated and was found stable for discharge, no specific findings for right knee pain, doubt internal derangement but cannot rule out meniscal abnormality.  No indication for further treatment in the ED setting.  CRITICAL CARE-no Performed by: Mancel Bale  Nursing Notes Reviewed/ Care Coordinated Applicable Imaging Reviewed Interpretation of Laboratory Data incorporated into ED treatment  The patient appears reasonably screened and/or stabilized for discharge and I doubt any other medical condition or other [facility] requiring further screening, evaluation, or treatment in the ED at this time prior to discharge.  Plan: Home Medications-continue usual; Home Treatments-work release for 4 days, heat to affected area use knee brace; return here if the recommended treatment, does not improve the symptoms; Recommended follow up-PCP as needed     Final Clinical Impression(s) / ED Diagnoses Final diagnoses:  Acute pain of right knee    Rx / DC Orders ED Discharge Orders         Ordered    HYDROcodone-acetaminophen (NORCO/VICODIN) 5-325 MG tablet  Every 4 hours PRN        03/27/20 1123           Mancel Bale, MD 03/27/20 1133

## 2020-03-27 NOTE — ED Triage Notes (Signed)
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  Reports R knee pain, swelling, and bruising x 2 weeks.  No known injury.  Wearing knee brace on arrival.  Ambulatory to triage.

## 2020-03-30 ENCOUNTER — Ambulatory Visit: Payer: Medicaid Other | Admitting: Physician Assistant

## 2020-03-30 ENCOUNTER — Telehealth: Payer: Self-pay

## 2020-03-30 NOTE — Telephone Encounter (Signed)
Copied from CRM [id number]. Topic: General - Other >> Mar 30, 2020  9:35 AM Martha Herrera wrote: Reason for CRM: Patient called to cancel her appt. Today because she did not have a babysitter.  She did schedule another appt. For Thursday

## 2020-04-01 ENCOUNTER — Encounter: Payer: Self-pay | Admitting: Physician Assistant

## 2020-04-01 ENCOUNTER — Telehealth: Payer: Self-pay | Admitting: Physician Assistant

## 2020-04-01 ENCOUNTER — Ambulatory Visit: Payer: Medicaid Other | Admitting: Physician Assistant

## 2020-04-01 DIAGNOSIS — M25569 Pain in unspecified knee: Secondary | ICD-10-CM

## 2020-04-01 MED ORDER — MELOXICAM 7.5 MG PO TABS: 7.5000 mg | ORAL_TABLET | Freq: Every day | ORAL | 0 refills | Status: DC

## 2020-04-01 NOTE — Telephone Encounter (Signed)
Discussed missed appointment with patient. Three no shows on 01/29/2020, 03/30/2020 and 04/01/2020. Patient has been informed of policy, informed of virtual visits, and offered assistance.

## 2020-04-01 NOTE — Telephone Encounter (Signed)
Martha Herrera,   Can we check on 12/2019 referral for rheum? No update in system. Thank you!

## 2020-05-11 ENCOUNTER — Other Ambulatory Visit: Payer: No Typology Code available for payment source

## 2020-05-11 DIAGNOSIS — Z20822 Contact with and (suspected) exposure to covid-19: Secondary | ICD-10-CM

## 2020-05-12 ENCOUNTER — Other Ambulatory Visit: Payer: No Typology Code available for payment source

## 2020-05-13 LAB — NOVEL CORONAVIRUS, NAA: SARS-CoV-2, NAA: NOT DETECTED

## 2020-05-13 LAB — SARS-COV-2, NAA 2 DAY TAT

## 2020-05-24 ENCOUNTER — Ambulatory Visit
Admission: RE | Admit: 2020-05-24 | Discharge: 2020-05-24 | Disposition: A | Discharge status: Home / Self Care | Payer: No Typology Code available for payment source | Source: Ambulatory Visit

## 2020-05-24 ENCOUNTER — Other Ambulatory Visit: Payer: Self-pay

## 2020-05-24 ENCOUNTER — Encounter: Payer: Self-pay | Admitting: Emergency Medicine

## 2020-05-24 ENCOUNTER — Ambulatory Visit
Admission: EM | Admit: 2020-05-24 | Discharge: 2020-05-24 | Disposition: A | Discharge status: Home / Self Care | Payer: No Typology Code available for payment source | Attending: Emergency Medicine | Admitting: Emergency Medicine

## 2020-05-24 DIAGNOSIS — J029 Acute pharyngitis, unspecified: Secondary | ICD-10-CM | POA: Diagnosis not present

## 2020-05-24 DIAGNOSIS — Z1152 Encounter for screening for COVID-19: Secondary | ICD-10-CM

## 2020-05-24 DIAGNOSIS — H6592 Unspecified nonsuppurative otitis media, left ear: Secondary | ICD-10-CM | POA: Diagnosis not present

## 2020-05-24 MED ORDER — FLUTICASONE PROPIONATE 50 MCG/ACT NA SUSP
1.0000 | Freq: Every day | NASAL | 0 refills | Status: DC
Start: 2020-05-24 — End: 2020-07-19

## 2020-05-24 NOTE — ED Triage Notes (Signed)
Left ear pain and left side of throat pain

## 2020-05-24 NOTE — Discharge Instructions (Addendum)
Covid-19 testing ordered.  It will take 2 to 7 days for results to return.  Someone will call if your result is positive.  Get plenty of rest and push fluids Use OTC throat lozenges such as Halls, Cepacol or Vicks to soothe throat Drink warm or cool liquids, use throat lozenges, or popsicles to help alleviate symptoms Take OTC ibuprofen or tylenol as needed for pain Follow up with PCP if symptoms persists Return or go to ER if patient has any new or worsening symptoms such as fever, chills, nausea, vomiting, worsening sore throat, cough, abdominal pain, chest pain, changes in bowel or bladder habits, etc..Marland Kitchen

## 2020-05-24 NOTE — ED Provider Notes (Signed)
[facility]   [id number] 05/24/20 Arrival Time: 1837  ID:POEU THROAT  SUBJECTIVE: History from: patient.  Martha Herrera is a 25 y.o. female who presented to the urgent care for complaint of left sore throat and left ear pain for the past few days.  Denies sick exposure to COVID, strep, flu or mono, or precipitating event.  Has tried OTC medication without relief.  Symptoms are made worse with swallowing, but tolerating liquids and own secretions without difficulty.  Denies previous symptoms in the past.   Denies fever, chills, fatigue, ear pain, sinus pain, rhinorrhea, nasal congestion, cough, SOB, wheezing, chest pain, nausea, rash, changes in bowel or bladder habits.    ROS: As per HPI.  All other pertinent ROS negative.      Past Medical History:  Diagnosis Date  . Complication of anesthesia    hard to wake up  . Gastroesophageal reflux    takes prescribed meds daily  . Gestational diabetes   . Headache(784.0)    not as often  . Heart murmur   . Pneumonia 2013  . Varicella    Past Surgical History:  Procedure Laterality Date  . CESAREAN SECTION N/A 08/07/2018   Procedure: CESAREAN SECTION;  Surgeon: Lazaro Arms, MD;  Location: [facility] LD ORS;  Service: Obstetrics;  Laterality: N/A;  . ESOPHAGOGASTRODUODENOSCOPY  05/03/2012   Procedure: ESOPHAGOGASTRODUODENOSCOPY (EGD);  Surgeon: Jon Gills, MD;  Location: [facility] OR;  Service: Gastroenterology;  Laterality: N/A;  . tubes in ears    . WISDOM TOOTH EXTRACTION     Allergies  Allergen Reactions  . Penicillins Hives, Itching and Rash    Did it involve swelling of the face/tongue/throat, SOB, or low BP? No Did it involve sudden or severe rash/hives, skin peeling, or any reaction on the inside of your mouth or nose? No Did you need to seek medical attention at a hospital or doctor's office? No When did it last happen?childhood allergy If all above answers are "NO", may proceed with cephalosporin use.   Marland Kitchen Amoxil  [Amoxicillin] Itching and Rash    Did it involve swelling of the face/tongue/throat, SOB, or low BP? No Did it involve sudden or severe rash/hives, skin peeling, or any reaction on the inside of your mouth or nose? No Did you need to seek medical attention at a hospital or doctor's office? No When did it last happen?2019 If all above answers are "NO", may proceed with cephalosporin use.   No current facility-administered medications on file prior to encounter.   Current Outpatient Medications on File Prior to Encounter  Medication Sig Dispense Refill  . adapalene (DIFFERIN) 0.1 % gel Apply topically at bedtime. 45 g 0  . HYDROcodone-acetaminophen (NORCO/VICODIN) 5-325 MG tablet Take 1 tablet by mouth every 4 (four) hours as needed for moderate pain. 10 tablet 0  . loratadine (CLARITIN) 10 MG tablet Take 10 mg by mouth daily.    . meclizine (ANTIVERT) 25 MG tablet Take 1 tablet (25 mg total) by mouth 3 (three) times daily as needed for dizziness. 30 tablet 0  . meloxicam (MOBIC) 7.5 MG tablet Take 1 tablet (7.5 mg total) by mouth daily. 30 tablet 0  . omeprazole (PRILOSEC) 40 MG capsule Take 1 capsule (40 mg total) by mouth daily. 30 capsule 6  . ondansetron (ZOFRAN) 4 MG tablet Take 1 tablet (4 mg total) by mouth every 8 (eight) hours as needed for nausea or vomiting. 20 tablet 0  . SUMAtriptan (IMITREX) 50 MG tablet  May repeat in 2 hours if headache persists or recurs. 10 tablet 0   Social History   Socioeconomic History  . Marital status: Married    Spouse name: Kalleigh Harbor  . Number of children: 1  . Years of education: Not on file  . Highest education level: Associate degree: academic program  Occupational History  . Not on file  Tobacco Use  . Smoking status: Never Smoker  . Smokeless tobacco: Never Used  Vaping Use  . Vaping Use: Never used  Substance and Sexual Activity  . Alcohol use: No  . Drug use: No  . Sexual activity: Yes    Birth control/protection: None,  Condom  Other Topics Concern  . Not on file  Social History Narrative   10th grade   Social Determinants of Health   Financial Resource Strain: Not on file  Food Insecurity: Not on file  Transportation Needs: Not on file  Physical Activity: Not on file  Stress: Not on file  Social Connections: Not on file  Intimate Partner Violence: Not on file   Family History  Problem Relation Age of Onset  . GER disease Father   . Cancer Father        kidney  . Asthma Brother   . Miscarriages / [place] Brother   . GER disease Maternal Grandmother   . Hyperlipidemia Maternal Grandmother   . ADD / ADHD Brother   . Miscarriages / [place] Mother   . Headache Mother   . Cancer Maternal Grandfather   . Diabetes Maternal Grandfather   . Hyperlipidemia Maternal Grandfather   . Vision loss Maternal Grandfather   . GER disease Maternal Aunt   . Migraines Maternal Aunt   . Anxiety disorder Maternal Aunt   . Diabetes Paternal Aunt   . Cancer Paternal Uncle        spinal cancer  . Hernia Son   . Ataxia Neg Hx   . Chorea Neg Hx   . Dementia Neg Hx   . Mental retardation Neg Hx   . Multiple sclerosis Neg Hx   . Neurofibromatosis Neg Hx   . Neuropathy Neg Hx   . Parkinsonism Neg Hx   . Seizures Neg Hx   . Stroke Neg Hx   . Depression Neg Hx   . Bipolar disorder Neg Hx     OBJECTIVE:  Vitals:   05/24/20 1846 05/24/20 1849  BP: 131/87   Pulse: 100   Resp: 15   Temp: 98.9 F (37.2 C)   SpO2: 98%   Weight:  192 lb (87.1 kg)  Height:  4\' 10"  (1.473 m)     General appearance: alert; appears fatigued, but nontoxic, speaking in full sentences and managing own secretions HEENT: NCAT; Ears: EACs clear, TMs pearly gray with visible cone of light, without erythema; Eyes: PERRL, EOMI grossly; Nose: no obvious rhinorrhea; Throat: oropharynx clear, tonsils 1+ and mildly erythematous without white tonsillar exudates, uvula midline Neck: supple without LAD Lungs: CTA bilaterally  without adventitious breath sounds; cough absent Heart: regular rate and rhythm.  Radial pulses 2+ symmetrical bilaterally Skin: warm and dry Psychological: alert and cooperative; normal mood and affect  LABS: No results found for this or any previous visit (from the past 24 hour(s)).   ASSESSMENT & PLAN:  1. Sore throat   2. Middle ear effusion, left   3. Encounter for screening for COVID-19     Meds ordered this encounter  Medications  . fluticasone (FLONASE) 50 MCG/ACT nasal spray  Sig: Place 1 spray into both nostrils daily for 14 days.    Dispense:  16 g    Refill:  0    Covid-19 testing ordered.  It will take 2 to 7 days for results to return.  Someone will call if your result is positive.  Get plenty of rest and push fluids Use OTC throat lozenges such as Halls, Cepacol or Vicks to soothe throat Drink warm or cool liquids, use throat lozenges, or popsicles to help alleviate symptoms Take OTC ibuprofen or tylenol as needed for pain Follow up with PCP if symptoms persists Return or go to ER if patient has any new or worsening symptoms such as fever, chills, nausea, vomiting, worsening sore throat, cough, abdominal pain, chest pain, changes in bowel or bladder habits, etc...  Reviewed expectations re: course of current medical issues. Questions answered. Outlined signs and symptoms indicating need for more acute intervention. Patient verbalized understanding. After Visit Summary given.        Durward Parcel, FNP 05/24/20 1933

## 2020-05-25 ENCOUNTER — Telehealth: Payer: Medicaid Other | Admitting: Family Medicine

## 2020-05-25 ENCOUNTER — Inpatient Hospital Stay (HOSPITAL_COMMUNITY)
Admission: AD | Admit: 2020-05-25 | Discharge: 2020-05-25 | Disposition: A | Discharge status: Home / Self Care | Payer: No Typology Code available for payment source | Attending: Obstetrics and Gynecology | Admitting: Obstetrics and Gynecology

## 2020-05-25 ENCOUNTER — Encounter (HOSPITAL_COMMUNITY): Payer: Self-pay | Admitting: Obstetrics and Gynecology

## 2020-05-25 DIAGNOSIS — O99511 Diseases of the respiratory system complicating pregnancy, first trimester: Secondary | ICD-10-CM | POA: Diagnosis not present

## 2020-05-25 DIAGNOSIS — B9789 Other viral agents as the cause of diseases classified elsewhere: Secondary | ICD-10-CM | POA: Diagnosis not present

## 2020-05-25 DIAGNOSIS — Z3201 Encounter for pregnancy test, result positive: Secondary | ICD-10-CM | POA: Diagnosis not present

## 2020-05-25 DIAGNOSIS — O98511 Other viral diseases complicating pregnancy, first trimester: Secondary | ICD-10-CM | POA: Insufficient documentation

## 2020-05-25 DIAGNOSIS — H9202 Otalgia, left ear: Secondary | ICD-10-CM | POA: Diagnosis not present

## 2020-05-25 DIAGNOSIS — O99891 Other specified diseases and conditions complicating pregnancy: Secondary | ICD-10-CM | POA: Diagnosis not present

## 2020-05-25 DIAGNOSIS — Z3A01 Less than 8 weeks gestation of pregnancy: Secondary | ICD-10-CM | POA: Insufficient documentation

## 2020-05-25 DIAGNOSIS — J028 Acute pharyngitis due to other specified organisms: Secondary | ICD-10-CM | POA: Insufficient documentation

## 2020-05-25 DIAGNOSIS — H9209 Otalgia, unspecified ear: Secondary | ICD-10-CM

## 2020-05-25 LAB — POCT PREGNANCY, URINE: Preg Test, Ur: POSITIVE — AB

## 2020-05-25 NOTE — Discharge Instructions (Signed)
Safe Medications in Pregnancy   Acne:  Benzoyl Peroxide  Salicylic Acid   Backache/Headache:  Tylenol: 2 regular strength every 4 hours OR        2 Extra strength every 6 hours   Colds/Coughs/Allergies:  Benadryl (alcohol free) 25 mg every 6 hours as needed  Breath right strips  Claritin  Cepacol throat lozenges  Chloraseptic throat spray  Cold-Eeze- up to three times per day  Cough drops, alcohol free  Flonase (by prescription only)  Guaifenesin  Mucinex  Robitussin DM (plain only, alcohol free)  Saline nasal spray/drops  Sudafed (pseudoephedrine) & Actifed * use only after [redacted] weeks gestation and if you do not have high blood pressure  Tylenol  Vicks Vaporub  Zinc lozenges  Zyrtec   Constipation:  Colace  Ducolax suppositories  Fleet enema  Glycerin suppositories  Metamucil  Milk of magnesia  Miralax  Senokot  Smooth move tea   Diarrhea:  Kaopectate  Imodium A-D   *NO pepto Bismol   Hemorrhoids:  Anusol  Anusol HC  Preparation H  Tucks   Indigestion:  Tums  Maalox  Mylanta  Zantac  Pepcid   Insomnia:  Benadryl (alcohol free) 25mg  every 6 hours as needed  Tylenol PM  Unisom, no Gelcaps   Leg Cramps:  Tums  MagGel   Nausea/Vomiting:  Bonine  Dramamine  Emetrol  Ginger extract  Sea bands  Meclizine  Nausea medication to take during pregnancy:  Unisom (doxylamine succinate 25 mg tablets) Take one tablet daily at bedtime. If symptoms are not adequately controlled, the dose can be increased to a maximum recommended dose of two tablets daily (1/2 tablet in the morning, 1/2 tablet mid-afternoon and one at bedtime).  Vitamin B6 100mg  tablets. Take one tablet twice a day (up to 200 mg per day).   Skin Rashes:  Aveeno products  Benadryl cream or 25mg  every 6 hours as needed  Calamine Lotion  1% cortisone cream   Yeast infection:  Gyne-lotrimin 7  Monistat 7    **If taking multiple medications, please check labels to avoid  duplicating the same active ingredients  **take medication as directed on the label  ** Do not exceed 4000 mg of tylenol in 24 hours  **Do not take medications that contain aspirin or ibuprofen          Sore Throat A sore throat is pain, burning, irritation, or scratchiness in the throat. When you have a sore throat, you may feel pain or tenderness in your throat when you swallow or talk. Many things can cause a sore throat, including:  An infection.  Seasonal allergies.  Dryness in the air.  Irritants, such as smoke or pollution.  Radiation treatment to the area.  Gastroesophageal reflux disease (GERD).  A tumor. A sore throat is often the first sign of another sickness. It may happen with other symptoms, such as coughing, sneezing, fever, and swollen neck glands. Most sore throats go away without medical treatment. Follow these instructions at home:  Take over-the-counter medicines only as told by your health care provider. ? If your child has a sore throat, do not give your child aspirin because of the association with Reye syndrome.  Drink enough fluids to keep your urine pale yellow.  Rest as needed.  To help with pain, try: ? Sipping warm liquids, such as broth, herbal tea, or warm water. ? Eating or drinking cold or frozen liquids, such as frozen ice pops. ? Gargling with a salt-water mixture 3-4  times a day or as needed. To make a salt-water mixture, completely dissolve -1 tsp (3-6 g) of salt in 1 cup (237 mL) of warm water. ? Sucking on hard candy or throat lozenges. ? Putting a cool-mist humidifier in your bedroom at night to moisten the air. ? Sitting in the bathroom with the door closed for 5-10 minutes while you run hot water in the shower.  Do not use any products that contain nicotine or tobacco, such as cigarettes, e-cigarettes, and chewing tobacco. If you need help quitting, ask your health care provider.  Wash your hands well and often with soap and  water. If soap and water are not available, use hand sanitizer.      Contact a health care provider if:  You have a fever for more than 2-3 days.  You have symptoms that last (are persistent) for more than 2-3 days.  Your throat does not get better within 7 days.  You have a fever and your symptoms suddenly get worse.  Your child who is 3 months to 46 years old has a temperature of 102.83F (39C) or higher. Get help right away if:  You have difficulty breathing.  You cannot swallow fluids, soft foods, or your saliva.  You have increased swelling in your throat or neck.  You have persistent nausea and vomiting. Summary  A sore throat is pain, burning, irritation, or scratchiness in the throat. Many things can cause a sore throat.  Take over-the-counter medicines only as told by your health care provider. Do not give your child aspirin.  Drink plenty of fluids, and rest as needed.  Contact a health care provider if your symptoms worsen or your sore throat does not get better within 7 days. This information is not intended to replace advice given to you by your health care provider. Make sure you discuss any questions you have with your health care provider. Document Revised: 09/17/2017 Document Reviewed: 09/17/2017 Elsevier Patient Education  2021 [employer].

## 2020-05-25 NOTE — MAU Note (Signed)
..  Martha Herrera is a 25 y.o. at [redacted]w[redacted]d here in MAU reporting: Sore throat and ear pain, both began on Saturday. Reports a positive pregnancy test on 05/21/2020. Denies vaginal bleeding or abdominal cramping.  LMP: 04/24/2020  Pain score: 10/10 Vitals:   05/25/20 0023  BP: 127/78  Pulse: (!) 112  Resp: 15  Temp: 98.6 F (37 C)  SpO2: 100%      Lab orders placed from triage: POCT urine pregnancy test

## 2020-05-25 NOTE — MAU Provider Note (Signed)
[name] is a 25 y.o. G2P1001 pregnant female at [redacted]w[redacted]d by LMP (04/24/20) who presents to MAU today with complaint of a sore throat and ear pain on the left side. She was seen in urgent care this morning and tested for Covid - that test is still pending.  She states that urgent care would not prescribe her anything because she had a positive pregnancy test. She has no pregnancy related symptoms.   O BP 127/78 (BP Location: Right Arm)   Pulse (!) 112   Temp 98.6 F (37 C) (Oral)   Resp 15   LMP 04/24/2020   SpO2 100%  Physical Exam Vitals and nursing note reviewed.  Constitutional:      General: She is not in acute distress.    Appearance: She is not ill-appearing.  HENT:     Nose: No congestion.  Eyes:     Pupils: Pupils are equal, round, and reactive to light.  Cardiovascular:     Rate and Rhythm: Normal rate.  Pulmonary:     Effort: Pulmonary effort is normal.  Musculoskeletal:        General: Normal range of motion.     Cervical back: Normal range of motion.  Skin:    General: Skin is warm and dry.     Capillary Refill: Capillary refill takes less than 2 seconds.  Neurological:     Mental Status: She is alert and oriented to person, place, and time.  Psychiatric:        Mood and Affect: Mood normal.        Behavior: Behavior normal.        Thought Content: Thought content normal.        Judgment: Judgment normal.    Reviewed note from urgent care today - tonsils and ears clear. Afebrile. Likely viral in nature.  Discussed with patient that there is nothing we would prescribe at this time, explained typical course of viral illness and recommended Tylenol for pain and Cepacol for her sore throat. Will include list of safe meds in pregnancy in AVS. Advised pt to medicate symptoms at home and return to PCP or urgent care if they get worse or do not get better in 5-7 days.  A Viral illness (sore throat, otalgia) Medical screening exam complete  P Discharge from  MAU in stable condition Pt to establish routine prenatal care Warning signs for worsening condition that would warrant emergency follow-up discussed Patient may return to MAU as needed for emergent OB/GYN related complaints  Bernerd Limbo, CNM 05/25/2020 1:45 AM

## 2020-05-26 ENCOUNTER — Other Ambulatory Visit: Payer: No Typology Code available for payment source

## 2020-05-26 LAB — SARS-COV-2, NAA 2 DAY TAT

## 2020-05-26 LAB — NOVEL CORONAVIRUS, NAA: SARS-CoV-2, NAA: DETECTED — AB

## 2020-05-27 ENCOUNTER — Telehealth: Payer: Self-pay | Admitting: Nurse Practitioner

## 2020-05-27 NOTE — Telephone Encounter (Signed)
I called Martha Herrera to discuss Covid symptoms and the use of Sotrovimab, a monoclonal antibody infusion for those with mild to moderate Covid symptoms and at a high risk of hospitalization.     Pt is qualified for this infusion at the monoclonal antibody infusion center due to co-morbid conditions and/or a member of an at-risk group, however declines infusion at this time. Symptoms tier reviewed as well as criteria for ending isolation.  Symptoms reviewed that would warrant ED/Hospital evaluation. Preventative practices reviewed. Patient verbalized understanding. Patient advised to call back if he decides that he does want to get infusion. Callback number to the infusion center given. Patient advised to go to Urgent care or ED with severe symptoms. Last date pt would be eligible for infusion is 05/28/2020.    Nicolasa Ducking, NP

## 2020-05-31 ENCOUNTER — Encounter: Payer: Self-pay | Admitting: *Deleted

## 2020-06-02 ENCOUNTER — Telehealth: Payer: Self-pay

## 2020-06-02 NOTE — Telephone Encounter (Signed)
Called patient and she scheduled appointment for 06/08/2020.

## 2020-06-02 NOTE — Telephone Encounter (Signed)
Copied from CRM [id number]. Topic: General - Other >> Jun 02, 2020  2:08 PM Tamela Oddi wrote: Reason for CRM: Patient called to ask if she could schedule an appt. To have paperwork filled out even though she tested positive for covid last Monday.  Patient stated she does not have fever but only a little cough.  She needs this paperwork filled out and would like to know the best way.  Called office and spoke with Elana, and she suggested that we send a message and they will speak with the doctor and call patient back.  CB# [phone]

## 2020-06-02 NOTE — Telephone Encounter (Signed)
I'm not sure what paperwork needs being filled out. Likely needs OV and can be virtual.

## 2020-06-03 ENCOUNTER — Telehealth (INDEPENDENT_AMBULATORY_CARE_PROVIDER_SITE_OTHER): Payer: Medicaid Other | Admitting: Physician Assistant

## 2020-06-03 DIAGNOSIS — U071 COVID-19: Secondary | ICD-10-CM

## 2020-06-03 NOTE — Progress Notes (Signed)
MyChart Video Visit    Virtual Visit via Video Note   This visit type was conducted due to national recommendations for restrictions regarding the COVID-19 Pandemic (e.g. social distancing) in an effort to limit this patient's exposure and mitigate transmission in our community. This patient is at least at moderate risk for complications without adequate follow up. This format is felt to be most appropriate for this patient at this time. Physical exam was limited by quality of the video and audio technology used for the visit.   Patient location: Home Provider location: Office   I discussed the limitations of evaluation and management by telemedicine and the availability of in person appointments. The patient expressed understanding and agreed to proceed.  Patient: Martha Herrera   DOB: 24-Sep-1995   24 y.o. Female  MRN: [id number] Visit Date: 06/03/2020  Today's healthcare provider: Trey Sailors, Martha Herrera   Chief Complaint  Patient presents with  . Covid Positive  I,Martha Herrera,acting as a scribe for Martha Sailors, Martha Herrera.,have documented all relevant documentation on the behalf of Martha Sailors, Martha Herrera,as directed by  Martha Sailors, Martha Herrera while in the presence of Martha Sailors, Martha Herrera.  Subjective    URI  This is a new problem. The current episode started 1 to 4 weeks ago. There has been no fever. Associated symptoms include congestion, coughing and headaches. Pertinent negatives include no diarrhea, ear pain, nausea, rhinorrhea, sinus pain, sneezing, sore throat, vomiting or wheezing. She has tried acetaminophen and antihistamine for the symptoms. The treatment provided no relief.  Tested positive for covid on 05/26/2020. She reports feeling rather well until this past Monday. Last period April 24, 2020. She is mostly better but is having some discomfort from her cough and this is her most bothersome symptoms.      Medications: Outpatient Medications Prior to Visit   Medication Sig  . adapalene (DIFFERIN) 0.1 % gel Apply topically at bedtime.  . fluticasone (FLONASE) 50 MCG/ACT nasal spray Place 1 spray into both nostrils daily for 14 days.  Marland Kitchen HYDROcodone-acetaminophen (NORCO/VICODIN) 5-325 MG tablet Take 1 tablet by mouth every 4 (four) hours as needed for moderate pain.  Marland Kitchen loratadine (CLARITIN) 10 MG tablet Take 10 mg by mouth daily.  . meclizine (ANTIVERT) 25 MG tablet Take 1 tablet (25 mg total) by mouth 3 (three) times daily as needed for dizziness.  . meloxicam (MOBIC) 7.5 MG tablet Take 1 tablet (7.5 mg total) by mouth daily.  Marland Kitchen omeprazole (PRILOSEC) 40 MG capsule Take 1 capsule (40 mg total) by mouth daily.  . ondansetron (ZOFRAN) 4 MG tablet Take 1 tablet (4 mg total) by mouth every 8 (eight) hours as needed for nausea or vomiting.  . SUMAtriptan (IMITREX) 50 MG tablet May repeat in 2 hours if headache persists or recurs.   No facility-administered medications prior to visit.    Review of Systems  Constitutional: Positive for fatigue. Negative for appetite change, chills and fever.  HENT: Positive for congestion and sinus pressure. Negative for ear pain, postnasal drip, rhinorrhea, sinus pain, sneezing and sore throat.   Respiratory: Positive for cough and shortness of breath. Negative for chest tightness and wheezing.   Gastrointestinal: Negative for diarrhea, nausea and vomiting.  Neurological: Positive for weakness and headaches.      Objective    LMP 04/24/2020    Physical Exam Constitutional:      Appearance: Normal appearance.  Pulmonary:     Effort: Pulmonary effort is normal.  No respiratory distress.  Neurological:     Mental Status: She is alert and oriented to person, place, and time. Mental status is at baseline.  Psychiatric:        Mood and Affect: Mood normal.        Behavior: Behavior normal.        Assessment & Plan     1. COVID-19  Was seen by urgent care and prescribed flonase which she has not picked up  yet. Recommend this. Things like delsym and robitussin are category C for pregnancy, explained what this means.    No follow-ups on file.     I discussed the assessment and treatment plan with the patient. The patient was provided an opportunity to ask questions and all were answered. The patient agreed with the plan and demonstrated an understanding of the instructions.   The patient was advised to call back or seek an in-person evaluation if the symptoms worsen or if the condition fails to improve as anticipated.   ITrey Sailors, Martha Herrera, have reviewed all documentation for this visit. The documentation on 06/04/20 for the exam, diagnosis, procedures, and orders are all accurate and complete.  The entirety of the information documented in the History of Present Illness, Review of Systems and Physical Exam were personally obtained by me. Portions of this information were initially documented by [facility] and reviewed by me for thoroughness and accuracy.    Martha Herrera [facility] [phone] (phone) [phone] (fax)  [facility]

## 2020-06-08 ENCOUNTER — Encounter: Payer: Self-pay | Admitting: Physician Assistant

## 2020-06-08 ENCOUNTER — Other Ambulatory Visit: Payer: Self-pay

## 2020-06-08 ENCOUNTER — Ambulatory Visit: Payer: Medicaid Other | Admitting: Physician Assistant

## 2020-06-08 VITALS — BP 111/70 | HR 88 | Temp 98.3°F | Wt 187.9 lb

## 2020-06-08 DIAGNOSIS — Z021 Encounter for pre-employment examination: Secondary | ICD-10-CM | POA: Diagnosis not present

## 2020-06-08 NOTE — Progress Notes (Signed)
Established patient visit   Patient: Martha Herrera   DOB: 1995/05/14   24 y.o. Female  MRN: [id number] Visit Date: 06/08/2020  Today's healthcare provider: Trey Sailors, PA-C   No chief complaint on file. I,Porsha C McClurkin,acting as a Neurosurgeon for Trey Sailors, PA-C.,have documented all relevant documentation on the behalf of Trey Sailors, PA-C,as directed by  Trey Sailors, PA-C while in the presence of Trey Sailors, PA-C.  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 She states that her employer needs the form filled out.      Medications: Outpatient Medications Prior to Visit  Medication Sig  . loratadine (CLARITIN) 10 MG tablet Take 10 mg by mouth daily.  Marland Kitchen omeprazole (PRILOSEC) 40 MG capsule Take 1 capsule (40 mg total) by mouth daily.  Marland Kitchen adapalene (DIFFERIN) 0.1 % gel Apply topically at bedtime. (Patient not taking: Reported on 06/08/2020)  . fluticasone (FLONASE) 50 MCG/ACT nasal spray Place 1 spray into both nostrils daily for 14 days.  Marland Kitchen HYDROcodone-acetaminophen (NORCO/VICODIN) 5-325 MG tablet Take 1 tablet by mouth every 4 (four) hours as needed for moderate pain. (Patient not taking: Reported on 06/08/2020)  . meclizine (ANTIVERT) 25 MG tablet Take 1 tablet (25 mg total) by mouth 3 (three) times daily as needed for dizziness. (Patient not taking: Reported on 06/08/2020)  . meloxicam (MOBIC) 7.5 MG tablet Take 1 tablet (7.5 mg total) by mouth daily. (Patient not taking: Reported on 06/08/2020)  . ondansetron (ZOFRAN) 4 MG tablet Take 1 tablet (4 mg total) by mouth every 8 (eight) hours as needed for nausea or vomiting. (Patient not taking: Reported on 06/08/2020)  . SUMAtriptan (IMITREX) 50 MG tablet May repeat in 2 hours if headache persists or recurs. (Patient not taking: Reported on 06/08/2020)   No facility-administered medications prior to visit.    Review of Systems  Constitutional: Negative.    Respiratory: Negative.   Cardiovascular: Negative.   Hematological: Negative.        Objective    BP 111/70 (BP Location: Left Arm, Patient Position: Sitting, Cuff Size: Large)   Pulse 88   Temp 98.3 F (36.8 C) (Oral)   Wt 187 lb 14.4 oz (85.2 kg)   LMP 04/24/2020   SpO2 100%   BMI 39.27 kg/m     Physical Exam Constitutional:      Appearance: Normal appearance.  Cardiovascular:     Rate and Rhythm: Normal rate.  Pulmonary:     Effort: Pulmonary effort is normal. No respiratory distress.  Skin:    General: Skin is warm and dry.  Neurological:     General: No focal deficit present.     Mental Status: She is alert and oriented to person, place, and time.  Psychiatric:        Mood and Affect: Mood normal.        Behavior: Behavior normal.       No results found for any visits on 06/08/20.  Assessment & Plan    1. Pre-employment examination  Form filled out today.    No follow-ups on file.      ITrey Sailors, PA-C, have reviewed all documentation for this visit. The documentation on 06/08/20 for the exam, diagnosis, procedures, and orders are all accurate and complete.  The entirety of the information documented in the History of Present Illness, Review of Systems and Physical Exam were  personally obtained by me. Portions of this information were initially documented by [facility] and reviewed by me for thoroughness and accuracy.    I spent 20 minutes dedicated to the care of this patient on the date of this encounter to include pre-visit review of records, face-to-face time with the patient discussing employment form, and post visit ordering of testing.  Maryella Shivers  [facility] [phone] (phone) [phone] (fax)  [facility]

## 2020-06-10 ENCOUNTER — Other Ambulatory Visit: Payer: Self-pay | Admitting: Advanced Practice Midwife

## 2020-06-10 ENCOUNTER — Telehealth: Payer: Self-pay | Admitting: *Deleted

## 2020-06-10 MED ORDER — DOXYLAMINE-PYRIDOXINE 10-10 MG PO TBEC
DELAYED_RELEASE_TABLET | ORAL | 6 refills | Status: DC
Start: 2020-06-10 — End: 2021-01-22

## 2020-06-10 NOTE — Telephone Encounter (Signed)
done

## 2020-06-10 NOTE — Telephone Encounter (Signed)
Patient called requesting nausea medication. STates she was prescribed Diclegis with her first pregnancy and would like this sent in.  Dating u/s is on 2/15.

## 2020-06-14 ENCOUNTER — Other Ambulatory Visit: Payer: Self-pay | Admitting: Obstetrics & Gynecology

## 2020-06-14 DIAGNOSIS — O3680X0 Pregnancy with inconclusive fetal viability, not applicable or unspecified: Secondary | ICD-10-CM

## 2020-06-15 ENCOUNTER — Other Ambulatory Visit: Payer: Self-pay

## 2020-06-15 ENCOUNTER — Ambulatory Visit (INDEPENDENT_AMBULATORY_CARE_PROVIDER_SITE_OTHER): Payer: Medicaid Other

## 2020-06-15 DIAGNOSIS — Z3A01 Less than 8 weeks gestation of pregnancy: Secondary | ICD-10-CM

## 2020-06-15 DIAGNOSIS — O3680X0 Pregnancy with inconclusive fetal viability, not applicable or unspecified: Secondary | ICD-10-CM | POA: Diagnosis not present

## 2020-06-15 NOTE — Progress Notes (Signed)
[place] 7+3 wks,single IUP,CRL 12.73 mm,fhr 153 bpm,normal ovaries

## 2020-06-16 ENCOUNTER — Other Ambulatory Visit: Payer: No Typology Code available for payment source

## 2020-06-24 ENCOUNTER — Encounter: Payer: Self-pay | Admitting: *Deleted

## 2020-07-13 ENCOUNTER — Telehealth: Payer: Self-pay | Admitting: Obstetrics & Gynecology

## 2020-07-13 ENCOUNTER — Telehealth: Payer: Self-pay | Admitting: Women's Health

## 2020-07-13 NOTE — Telephone Encounter (Signed)
Pt calling to state that the meds for her migraine have not helped, took about 11:45am & still no relief  Please advise

## 2020-07-13 NOTE — Telephone Encounter (Signed)
Pt is [redacted]w[redacted]d, has had a migraine for 2 days - pt's used ice pack, warm wash cloth, Tylenol, plenty of water What else can she do?  Please advise & notify pt   [facility] [place]

## 2020-07-13 NOTE — Telephone Encounter (Signed)
Pt was advised earlier to take excedrin but it has not helped her headache at all. Was told to call back for rx if no relief.

## 2020-07-13 NOTE — Telephone Encounter (Signed)
Discussed pt's concerns with Dr Charlotta Newton. Returned pt's call and informed her to take Excedrin migraine OTC per Dr Charlotta Newton. If that doesn't help, pt will give the office a call and see about something prescribed. Pt confirms understanding.

## 2020-07-16 ENCOUNTER — Other Ambulatory Visit: Payer: Self-pay | Admitting: Obstetrics & Gynecology

## 2020-07-16 DIAGNOSIS — Z3682 Encounter for antenatal screening for nuchal translucency: Secondary | ICD-10-CM

## 2020-07-19 ENCOUNTER — Encounter: Payer: Self-pay | Admitting: Women's Health

## 2020-07-19 ENCOUNTER — Other Ambulatory Visit: Payer: Self-pay

## 2020-07-19 ENCOUNTER — Ambulatory Visit (INDEPENDENT_AMBULATORY_CARE_PROVIDER_SITE_OTHER): Payer: Medicaid Other

## 2020-07-19 ENCOUNTER — Ambulatory Visit: Payer: Medicaid Other | Admitting: *Deleted

## 2020-07-19 ENCOUNTER — Ambulatory Visit (INDEPENDENT_AMBULATORY_CARE_PROVIDER_SITE_OTHER): Payer: Medicaid Other | Admitting: Women's Health

## 2020-07-19 VITALS — BP 116/83 | HR 73 | Wt 184.0 lb

## 2020-07-19 DIAGNOSIS — Z8632 Personal history of gestational diabetes: Secondary | ICD-10-CM | POA: Diagnosis not present

## 2020-07-19 DIAGNOSIS — Z3480 Encounter for supervision of other normal pregnancy, unspecified trimester: Secondary | ICD-10-CM

## 2020-07-19 DIAGNOSIS — Z3682 Encounter for antenatal screening for nuchal translucency: Secondary | ICD-10-CM | POA: Diagnosis not present

## 2020-07-19 DIAGNOSIS — Z3481 Encounter for supervision of other normal pregnancy, first trimester: Secondary | ICD-10-CM | POA: Diagnosis not present

## 2020-07-19 DIAGNOSIS — Z31430 Encounter of female for testing for genetic disease carrier status for procreative management: Secondary | ICD-10-CM | POA: Diagnosis not present

## 2020-07-19 DIAGNOSIS — Z3490 Encounter for supervision of normal pregnancy, unspecified, unspecified trimester: Secondary | ICD-10-CM | POA: Insufficient documentation

## 2020-07-19 DIAGNOSIS — Z3A12 12 weeks gestation of pregnancy: Secondary | ICD-10-CM | POA: Diagnosis not present

## 2020-07-19 LAB — POCT URINALYSIS DIPSTICK OB
Blood, UA: NEGATIVE
Glucose, UA: NEGATIVE
Ketones, UA: NEGATIVE
Leukocytes, UA: NEGATIVE
Nitrite, UA: NEGATIVE
POC,PROTEIN,UA: NEGATIVE

## 2020-07-19 MED ORDER — BLOOD PRESSURE MONITOR MISC: 0 refills | Status: DC

## 2020-07-19 NOTE — Progress Notes (Signed)
[place] 12+2 wks,measurements c/w dates,crl 62.18 mm,posterior placenta gr 0,normal ovaries,NB present,NT 1.4 mm,FHR 166

## 2020-07-19 NOTE — Progress Notes (Signed)
INITIAL OBSTETRICAL VISIT Patient name: Martha Herrera MRN [id number]  Date of birth: Sep 26, 1995 Chief Complaint:   Initial Prenatal Visit (Nt/it)  History of Present Illness:   Martha Herrera is a 25 y.o. G54P1001 Caucasian female at [redacted]w[redacted]d by LMP c/w u/s at 7 weeks with an Estimated Date of Delivery: 01/29/21 being seen today for her initial obstetrical visit.   Her obstetrical history is significant for elective PCS d/t GDM w/ suspected fetal macrosomia.   Today she reports no complaints.  Depression screen [facility] 2/9 07/19/2020 06/08/2020 11/11/2019 09/17/2019 04/30/2019  Decreased Interest 0 0 1 0 0  Down, Depressed, Hopeless 0 0 0 0 0  PHQ - 2 Score 0 0 1 0 0  Altered sleeping 0 3 3 3 1   Tired, decreased energy 0 3 3 3 1   Change in appetite 0 0 0 0 0  Feeling bad or failure about yourself  0 0 0 0 0  Trouble concentrating 0 0 0 0 0  Moving slowly or fidgety/restless 0 0 0 0 0  Suicidal thoughts 0 0 0 0 0  PHQ-9 Score 0 6 7 6 2   Difficult doing work/chores - Not difficult at all Not difficult at all Somewhat difficult Not difficult at all  Some recent data might be hidden    Patient's last menstrual period was 04/24/2020. Last pap 11/11/19. Results were: NILM w/ HRHPV not done Review of Systems:   Pertinent items are noted in HPI Denies cramping/contractions, leakage of fluid, vaginal bleeding, abnormal vaginal discharge w/ itching/odor/irritation, headaches, visual changes, shortness of breath, chest pain, abdominal pain, severe nausea/vomiting, or problems with urination or bowel movements unless otherwise stated above.  Pertinent History Reviewed:  Reviewed past medical,surgical, social, obstetrical and family history.  Reviewed problem list, medications and allergies. OB History  Gravida Para Term Preterm AB Living  2 1 1  0 0 1  SAB IAB Ectopic Multiple Live Births  0 0 0 0 1    # Outcome Date GA Lbr Len/2nd Weight Sex Delivery Anes PTL Lv  2 Current           1 Term 08/07/18  [redacted]w[redacted]d  9 lb 7 oz (4.28 kg) M CS-LTranv Spinal  LIV     Birth Comments: elective d/t GDM/fetal macrosomia   Physical Assessment:   Vitals:   07/19/20 0956  BP: 116/83  Pulse: 73  Weight: 184 lb (83.5 kg)  Body mass index is 38.46 kg/m.       Physical Examination:  General appearance - well appearing, and in no distress  Mental status - alert, oriented to person, place, and time  Psych:  She has a normal mood and affect  Skin - warm and dry, normal color, no suspicious lesions noted  Chest - effort normal, all lung fields clear to auscultation bilaterally  Heart - normal rate and regular rhythm  Abdomen - soft, nontender  Extremities:  No swelling or varicosities noted  Thin prep pap is not done   Chaperone: N/A    TODAY'S NT  12+2 wks,measurements c/w dates,crl 62.18 mm,posterior placenta gr 0,normal ovaries,NB present,NT 1.4 mm,FHR 166  Results for orders placed or performed in visit on 07/19/20 (from the past 24 hour(s))  POC Urinalysis Dipstick OB   Collection Time: 07/19/20 10:40 AM  Result Value Ref Range   Color, UA     Clarity, UA     Glucose, UA Negative Negative   Bilirubin, UA     Ketones, UA  neg    Spec Grav, UA     Blood, UA neg    pH, UA     POC,PROTEIN,UA Negative Negative, Trace, Small (1+), Moderate (2+), Large (3+), 4+   Urobilinogen, UA     Nitrite, UA neg    Leukocytes, UA Negative Negative   Appearance     Odor      Assessment & Plan:  1) Low-Risk Pregnancy G2P1001 at [redacted]w[redacted]d with an Estimated Date of Delivery: 01/29/21   2) Initial OB visit  3) Prev c/s> d/t GDM w/ fetal macrosomia, wants to see how this pregnancy goes, gave VBAC consent to review  4) H/O GDM> A1C today  Meds:  Meds ordered this encounter  Medications  . Blood Pressure Monitor MISC    Sig: For regular home bp monitoring during pregnancy    Dispense:  1 each    Refill:  0    Z34.81    Initial labs obtained Continue prenatal vitamins Reviewed n/v relief measures  and warning s/s to report Reviewed recommended weight gain based on pre-gravid BMI Encouraged well-balanced diet Genetic & carrier screening discussed: requests Panorama, NT/IT and Horizon 14  Ultrasound discussed; fetal survey: requested CCNC completed> form faxed if has or is planning to apply for medicaid The nature of [employer] for [employer] with multiple MDs and other Advanced Practice Providers was explained to patient; also emphasized that fellows, residents, and students are part of our team. Does not have home bp cuff. Rx faxed to [facility]. Check bp weekly, let us know if >140/90.   Follow-up: Return in about 3 weeks (around 08/09/2020) for LROB, CNM, 2nd IT, in person.   Orders Placed This Encounter  Procedures  . GC/Chlamydia Probe Amp  . Urine Culture  . Integrated 1  . Genetic Screening  . CBC/D/Plt+RPR+Rh+ABO+Rub Ab...  . Pain Management Screening Profile (10S)  . Hemoglobin A1c  . POC Urinalysis Dipstick OB    Cheral Marker CNM, [facility] 07/19/2020 11:05 AM

## 2020-07-19 NOTE — Patient Instructions (Signed)
Martha Herrera, I greatly value your feedback.  If you receive a survey following your visit with [place] today, we appreciate you taking the time to fill it out.  Thanks, [name], CNM, WHNP-BC   [facility] ([address] [place], [place] [address]) Entrance C, located off of [address] Free 24/7 valet parking   Nausea & Vomiting  Have saltine crackers or pretzels by your bed and eat a few bites before you raise your head out of bed in the morning  Eat small frequent meals throughout the day instead of large meals  Drink plenty of fluids throughout the day to stay hydrated, just don't drink a lot of fluids with your meals.  This can make your stomach fill up faster making you feel sick  Do not brush your teeth right after you eat  Products with real ginger are good for nausea, like ginger ale and ginger hard candy Make sure it says made with real ginger!  Sucking on sour candy like lemon heads is also good for nausea  If your prenatal vitamins make you nauseated, take them at night so you will sleep through the nausea  Sea Bands  If you feel like you need medicine for the nausea & vomiting please let us know  If you are unable to keep any fluids or food down please let us know   Constipation  Drink plenty of fluid, preferably water, throughout the day  Eat foods high in fiber such as fruits, vegetables, and grains  Exercise, such as walking, is a good way to keep your bowels regular  Drink warm fluids, especially warm prune juice, or decaf coffee  Eat a 1/2 cup of real oatmeal (not instant), 1/2 cup applesauce, and 1/2-1 cup warm prune juice every day  If needed, you may take Colace (docusate sodium) stool softener once or twice a day to help keep the stool soft.   If you still are having problems with constipation, you may take Miralax once daily as needed to help keep your bowels regular.   Home Blood Pressure Monitoring for Patients    Your provider has recommended that you check your blood pressure (BP) at least once a week at home. If you do not have a blood pressure cuff at home, one will be provided for you. Contact your provider if you have not received your monitor within 1 week.   Helpful Tips for Accurate Home Blood Pressure Checks  . Don't smoke, exercise, or drink caffeine 30 minutes before checking your BP . Use the restroom before checking your BP (a full bladder can raise your pressure) . Relax in a comfortable upright chair . Feet on the ground . Left arm resting comfortably on a flat surface at the level of your heart . Legs uncrossed . Back supported . Sit quietly and don't talk . Place the cuff on your bare arm . Adjust snuggly, so that only two fingertips can fit between your skin and the top of the cuff . Check 2 readings separated by at least one minute . Keep a log of your BP readings . For a visual, please reference this diagram: http://ccnc.care/bpdiagram  Provider Name: [facility]     Phone: [phone]  Zone 1: ALL CLEAR  Continue to monitor your symptoms:  . BP reading is less than 140 (top number) or less than 90 (bottom number)  . No right upper stomach pain . No headaches or  seeing spots . No feeling nauseated or throwing up . No swelling in face and hands  Zone 2: CAUTION Call your doctor's office for any of the following:  . BP reading is greater than 140 (top number) or greater than 90 (bottom number)  . Stomach pain under your ribs in the middle or right side . Headaches or seeing spots . Feeling nauseated or throwing up . Swelling in face and hands  Zone 3: EMERGENCY  Seek immediate medical care if you have any of the following:  . BP reading is greater than160 (top number) or greater than 110 (bottom number) . Severe headaches not improving with Tylenol . Serious difficulty catching your breath . Any worsening symptoms from Zone 2    First Trimester of  Pregnancy The first trimester of pregnancy is from week 1 until the end of week 12 (months 1 through 3). A week after a sperm fertilizes an egg, the egg will implant on the wall of the uterus. This embryo will begin to develop into a baby. Genes from you and your partner are forming the baby. The female genes determine whether the baby is a boy or a girl. At 6-8 weeks, the eyes and face are formed, and the heartbeat can be seen on ultrasound. At the end of 12 weeks, all the baby's organs are formed.  Now that you are pregnant, you will want to do everything you can to have a healthy baby. Two of the most important things are to get good prenatal care and to follow your health care provider's instructions. Prenatal care is all the medical care you receive before the baby's birth. This care will help prevent, find, and treat any problems during the pregnancy and childbirth. BODY CHANGES Your body goes through many changes during pregnancy. The changes vary from woman to woman.   You may gain or lose a couple of pounds at first.  You may feel sick to your stomach (nauseous) and throw up (vomit). If the vomiting is uncontrollable, call your health care provider.  You may tire easily.  You may develop headaches that can be relieved by medicines approved by your health care provider.  You may urinate more often. Painful urination may mean you have a bladder infection.  You may develop heartburn as a result of your pregnancy.  You may develop constipation because certain hormones are causing the muscles that push waste through your intestines to slow down.  You may develop hemorrhoids or swollen, bulging veins (varicose veins).  Your breasts may begin to grow larger and become tender. Your nipples may stick out more, and the tissue that surrounds them (areola) may become darker.  Your gums may bleed and may be sensitive to brushing and flossing.  Dark spots or blotches (chloasma, mask of pregnancy)  may develop on your face. This will likely fade after the baby is born.  Your menstrual periods will stop.  You may have a loss of appetite.  You may develop cravings for certain kinds of food.  You may have changes in your emotions from day to day, such as being excited to be pregnant or being concerned that something may go wrong with the pregnancy and baby.  You may have more vivid and strange dreams.  You may have changes in your hair. These can include thickening of your hair, rapid growth, and changes in texture. Some women also have hair loss during or after pregnancy, or hair that feels dry or thin. Your  hair will most likely return to normal after your baby is born. WHAT TO EXPECT AT YOUR PRENATAL VISITS During a routine prenatal visit:  You will be weighed to make sure you and the baby are growing normally.  Your blood pressure will be taken.  Your abdomen will be measured to track your baby's growth.  The fetal heartbeat will be listened to starting around week 10 or 12 of your pregnancy.  Test results from any previous visits will be discussed. Your health care provider may ask you:  How you are feeling.  If you are feeling the baby move.  If you have had any abnormal symptoms, such as leaking fluid, bleeding, severe headaches, or abdominal cramping.  If you have any questions. Other tests that may be performed during your first trimester include:  Blood tests to find your blood type and to check for the presence of any previous infections. They will also be used to check for low iron levels (anemia) and Rh antibodies. Later in the pregnancy, blood tests for diabetes will be done along with other tests if problems develop.  Urine tests to check for infections, diabetes, or protein in the urine.  An ultrasound to confirm the proper growth and development of the baby.  An amniocentesis to check for possible genetic problems.  Fetal screens for spina bifida and  Down syndrome.  You may need other tests to make sure you and the baby are doing well. HOME CARE INSTRUCTIONS  Medicines  Follow your health care provider's instructions regarding medicine use. Specific medicines may be either safe or unsafe to take during pregnancy.  Take your prenatal vitamins as directed.  If you develop constipation, try taking a stool softener if your health care provider approves. Diet  Eat regular, well-balanced meals. Choose a variety of foods, such as meat or vegetable-based protein, fish, milk and low-fat dairy products, vegetables, fruits, and whole grain breads and cereals. Your health care provider will help you determine the amount of weight gain that is right for you.  Avoid raw meat and uncooked cheese. These carry germs that can cause birth defects in the baby.  Eating four or five small meals rather than three large meals a day may help relieve nausea and vomiting. If you start to feel nauseous, eating a few soda crackers can be helpful. Drinking liquids between meals instead of during meals also seems to help nausea and vomiting.  If you develop constipation, eat more high-fiber foods, such as fresh vegetables or fruit and whole grains. Drink enough fluids to keep your urine clear or pale yellow. Activity and Exercise  Exercise only as directed by your health care provider. Exercising will help you:  Control your weight.  Stay in shape.  Be prepared for labor and delivery.  Experiencing pain or cramping in the lower abdomen or low back is a good sign that you should stop exercising. Check with your health care provider before continuing normal exercises.  Try to avoid standing for long periods of time. Move your legs often if you must stand in one place for a long time.  Avoid heavy lifting.  Wear low-heeled shoes, and practice good posture.  You may continue to have sex unless your health care provider directs you otherwise. Relief of Pain  or Discomfort  Wear a good support bra for breast tenderness.    Take warm sitz baths to soothe any pain or discomfort caused by hemorrhoids. Use hemorrhoid cream if your health care provider  approves.    Rest with your legs elevated if you have leg cramps or low back pain.  If you develop varicose veins in your legs, wear support hose. Elevate your feet for 15 minutes, 3-4 times a day. Limit salt in your diet. Prenatal Care  Schedule your prenatal visits by the twelfth week of pregnancy. They are usually scheduled monthly at first, then more often in the last 2 months before delivery.  Write down your questions. Take them to your prenatal visits.  Keep all your prenatal visits as directed by your health care provider. Safety  Wear your seat belt at all times when driving.  Make a list of emergency phone numbers, including numbers for family, friends, the hospital, and police and fire departments. General Tips  Ask your health care provider for a referral to a local prenatal education class. Begin classes no later than at the beginning of month 6 of your pregnancy.  Ask for help if you have counseling or nutritional needs during pregnancy. Your health care provider can offer advice or refer you to specialists for help with various needs.  Do not use hot tubs, steam rooms, or saunas.  Do not douche or use tampons or scented sanitary pads.  Do not cross your legs for long periods of time.  Avoid cat litter boxes and soil used by cats. These carry germs that can cause birth defects in the baby and possibly loss of the fetus by miscarriage or stillbirth.  Avoid all smoking, herbs, alcohol, and medicines not prescribed by your health care provider. Chemicals in these affect the formation and growth of the baby.  Schedule a dentist appointment. At home, brush your teeth with a soft toothbrush and be gentle when you floss. SEEK MEDICAL CARE IF:   You have dizziness.  You have mild  pelvic cramps, pelvic pressure, or nagging pain in the abdominal area.  You have persistent nausea, vomiting, or diarrhea.  You have a bad smelling vaginal discharge.  You have pain with urination.  You notice increased swelling in your face, hands, legs, or ankles. SEEK IMMEDIATE MEDICAL CARE IF:   You have a fever.  You are leaking fluid from your vagina.  You have spotting or bleeding from your vagina.  You have severe abdominal cramping or pain.  You have rapid weight gain or loss.  You vomit blood or material that looks like coffee grounds.  You are exposed to [place] measles and have never had them.  You are exposed to fifth disease or chickenpox.  You develop a severe headache.  You have shortness of breath.  You have any kind of trauma, such as from a fall or a car accident. Document Released: 04/11/2001 Document Revised: 09/01/2013 Document Reviewed: 02/25/2013 [facility] Patient Information 2015 [place], [place]. This information is not intended to replace advice given to you by your health care provider. Make sure you discuss any questions you have with your health care provider.

## 2020-07-20 LAB — PMP SCREEN PROFILE (10S), URINE
Amphetamine Scrn, Ur: NEGATIVE ng/mL
BARBITURATE SCREEN URINE: NEGATIVE ng/mL
BENZODIAZEPINE SCREEN, URINE: NEGATIVE ng/mL
CANNABINOIDS UR QL SCN: NEGATIVE ng/mL
Cocaine (Metab) Scrn, Ur: NEGATIVE ng/mL
Creatinine(Crt), U: 95.1 mg/dL (ref 20.0–300.0)
Methadone Screen, Urine: NEGATIVE ng/mL
OXYCODONE+OXYMORPHONE UR QL SCN: NEGATIVE ng/mL
Opiate Scrn, Ur: NEGATIVE ng/mL
Ph of Urine: 7.9 (ref 4.5–8.9)
Phencyclidine Qn, Ur: NEGATIVE ng/mL
Propoxyphene Scrn, Ur: NEGATIVE ng/mL

## 2020-07-20 LAB — GC/CHLAMYDIA PROBE AMP
Chlamydia trachomatis, NAA: NEGATIVE
Neisseria Gonorrhoeae by PCR: NEGATIVE

## 2020-07-21 LAB — CBC/D/PLT+RPR+RH+ABO+RUB AB...
Antibody Screen: NEGATIVE
Basophils Absolute: 0 10*3/uL (ref 0.0–0.2)
Basos: 0 %
EOS (ABSOLUTE): 0.2 10*3/uL (ref 0.0–0.4)
Eos: 3 %
HCV Ab: 0.1 s/co ratio (ref 0.0–0.9)
HIV Screen 4th Generation wRfx: NONREACTIVE
Hematocrit: 40.3 % (ref 34.0–46.6)
Hemoglobin: 13.5 g/dL (ref 11.1–15.9)
Hepatitis B Surface Ag: NEGATIVE
Immature Grans (Abs): 0.1 10*3/uL (ref 0.0–0.1)
Immature Granulocytes: 1 %
Lymphocytes Absolute: 2.1 10*3/uL (ref 0.7–3.1)
Lymphs: 24 %
MCH: 27.5 pg (ref 26.6–33.0)
MCHC: 33.5 g/dL (ref 31.5–35.7)
MCV: 82 fL (ref 79–97)
Monocytes Absolute: 0.5 10*3/uL (ref 0.1–0.9)
Monocytes: 6 %
Neutrophils Absolute: 5.9 10*3/uL (ref 1.4–7.0)
Neutrophils: 66 %
Platelets: 265 10*3/uL (ref 150–450)
RBC: 4.91 x10E6/uL (ref 3.77–5.28)
RDW: 12.8 % (ref 11.7–15.4)
RPR Ser Ql: NONREACTIVE
Rh Factor: POSITIVE
Rubella Antibodies, IGG: 1.47 index (ref >0.99)
WBC: 8.8 10*3/uL (ref 3.4–10.8)

## 2020-07-21 LAB — INTEGRATED 1
Crown Rump Length: 62.2 mm
Gest. Age on Collection Date: 12.4 weeks
Maternal Age at EDD: 25.4 yr
Nuchal Translucency (NT): 1.4 mm
Number of Fetuses: 1
PAPP-A Value: 418.8 ng/mL
Weight: 184 [lb_av]

## 2020-07-21 LAB — HEMOGLOBIN A1C
Est. average glucose Bld gHb Est-mCnc: 94 mg/dL
Hgb A1c MFr Bld: 4.9 % (ref 4.8–5.6)

## 2020-07-21 LAB — HCV INTERPRETATION

## 2020-07-22 LAB — URINE CULTURE

## 2020-07-26 DIAGNOSIS — Z3480 Encounter for supervision of other normal pregnancy, unspecified trimester: Secondary | ICD-10-CM | POA: Diagnosis not present

## 2020-08-04 ENCOUNTER — Other Ambulatory Visit: Payer: Self-pay | Admitting: Advanced Practice Midwife

## 2020-08-04 MED ORDER — ONDANSETRON 4 MG PO TBDP: 4.0000 mg | ORAL_TABLET | Freq: Three times a day (TID) | ORAL | 3 refills | Status: DC | PRN

## 2020-08-09 ENCOUNTER — Other Ambulatory Visit: Payer: Self-pay

## 2020-08-09 ENCOUNTER — Encounter: Payer: Self-pay | Admitting: Obstetrics & Gynecology

## 2020-08-09 ENCOUNTER — Ambulatory Visit (INDEPENDENT_AMBULATORY_CARE_PROVIDER_SITE_OTHER): Payer: Medicaid Other | Admitting: Obstetrics & Gynecology

## 2020-08-09 VITALS — BP 121/76 | HR 93 | Wt 185.6 lb

## 2020-08-09 DIAGNOSIS — Z3482 Encounter for supervision of other normal pregnancy, second trimester: Secondary | ICD-10-CM

## 2020-08-09 DIAGNOSIS — Z3A15 15 weeks gestation of pregnancy: Secondary | ICD-10-CM | POA: Diagnosis not present

## 2020-08-09 NOTE — Progress Notes (Signed)
   LOW-RISK PREGNANCY VISIT Patient name: Martha Herrera MRN [id number]  Date of birth: 04/07/1996 Chief Complaint:   Routine Prenatal Visit (/////////)  History of Present Illness:   Martha Herrera is a 25 y.o. G21P1001 female at [redacted]w[redacted]d with an Estimated Date of Delivery: 01/29/21 being seen today for ongoing management of a low-risk pregnancy.  Depression screen [facility] 2/9 07/19/2020 06/08/2020 11/11/2019 09/17/2019 04/30/2019  Decreased Interest 0 0 1 0 0  Down, Depressed, Hopeless 0 0 0 0 0  PHQ - 2 Score 0 0 1 0 0  Altered sleeping 0 3 3 3 1   Tired, decreased energy 0 3 3 3 1   Change in appetite 0 0 0 0 0  Feeling bad or failure about yourself  0 0 0 0 0  Trouble concentrating 0 0 0 0 0  Moving slowly or fidgety/restless 0 0 0 0 0  Suicidal thoughts 0 0 0 0 0  PHQ-9 Score 0 6 7 6 2   Difficult doing work/chores - Not difficult at all Not difficult at all Somewhat difficult Not difficult at all  Some recent data might be hidden    Today she reports no complaints. Contractions: Not present. Vag. Bleeding: None.  Movement: Present. denies leaking of fluid. Review of Systems:   Pertinent items are noted in HPI Denies abnormal vaginal discharge w/ itching/odor/irritation, headaches, visual changes, shortness of breath, chest pain, abdominal pain, severe nausea/vomiting, or problems with urination or bowel movements unless otherwise stated above. Pertinent History Reviewed:  Reviewed past medical,surgical, social, obstetrical and family history.  Reviewed problem list, medications and allergies.  Physical Assessment:   Vitals:   08/09/20 1613  BP: 121/76  Pulse: 93  Weight: 185 lb 9.6 oz (84.2 kg)  Body mass index is 38.79 kg/m.        Physical Examination:   General appearance: Well appearing, and in no distress  Mental status: Alert, oriented to person, place, and time  Skin: Warm & dry  Respiratory: Normal respiratory effort, no distress  Abdomen: Soft, gravid, nontender  Pelvic:  Cervical exam deferred         Extremities: Edema: None  Psych:  mood and affect appropriate  Fetal Status: Fetal Heart Rate (bpm): 155   Movement: Present    Uterus below umbilicus  Chaperone: n/a    No results found for this or any previous visit (from the past 24 hour(s)).   Assessment & Plan:  1) Low-risk pregnancy G2P1001 at [redacted]w[redacted]d with an Estimated Date of Delivery: 01/29/21   2) Prior C-section Discussed TOLAC vs repeat C-section- reviewed risk/benefit and discussed VBAC calculator.  Due to prior LGA will likely plan for growth scan close to deliver to help determine route of delivery  H/o GDM- encouraged well balanced diet   Meds: No orders of the defined types were placed in this encounter.  Labs/procedures today: IT-2  Plan:  Continue routine obstetrical care  Next visit: prefers in person    Reviewed: Preterm labor symptoms and general obstetric precautions including but not limited to vaginal bleeding, contractions, leaking of fluid and fetal movement were reviewed in detail with the patient.  All questions were answered.   Follow-up: Return in about 4 weeks (around 09/06/2020) for LROB visit and anatomy scan.  Orders Placed This Encounter  Procedures  . INTEGRATED 2    [redacted]w[redacted]d, DO Attending Obstetrician & Gynecologist, [facility] for 11/06/2020, [facility]

## 2020-08-11 LAB — INTEGRATED 2
AFP MoM: 0.78
Alpha-Fetoprotein: 20.4 ng/mL
Crown Rump Length: 62.2 mm
DIA MoM: 0.58
DIA Value: 82.4 pg/mL
Estriol, Unconjugated: 0.63 ng/mL
Gest. Age on Collection Date: 12.4 weeks
Gestational Age: 15.4 weeks
Maternal Age at EDD: 25.4 yr
Nuchal Translucency (NT): 1.4 mm
Nuchal Translucency MoM: 1.01
Number of Fetuses: 1
PAPP-A MoM: 0.55
PAPP-A Value: 418.8 ng/mL
Test Results:: NEGATIVE
Weight: 184 [lb_av]
Weight: 184 [lb_av]
hCG MoM: 0.6
hCG Value: 22.3 IU/mL
uE3 MoM: 0.83

## 2020-08-26 ENCOUNTER — Telehealth: Payer: Self-pay | Admitting: Obstetrics & Gynecology

## 2020-08-26 NOTE — Telephone Encounter (Signed)
Patient reported a cough that is keeping her from sleeping and wants a call from a nurse. Clinical staff will follow up with patient.

## 2020-08-30 ENCOUNTER — Encounter: Payer: Self-pay | Admitting: Women's Health

## 2020-08-30 ENCOUNTER — Other Ambulatory Visit: Payer: Self-pay

## 2020-08-30 ENCOUNTER — Ambulatory Visit (INDEPENDENT_AMBULATORY_CARE_PROVIDER_SITE_OTHER): Payer: Medicaid Other | Admitting: Women's Health

## 2020-08-30 VITALS — BP 116/74 | HR 88 | Temp 98.6°F | Wt 184.6 lb

## 2020-08-30 DIAGNOSIS — Z3A18 18 weeks gestation of pregnancy: Secondary | ICD-10-CM | POA: Diagnosis not present

## 2020-08-30 DIAGNOSIS — R059 Cough, unspecified: Secondary | ICD-10-CM

## 2020-08-30 DIAGNOSIS — J302 Other seasonal allergic rhinitis: Secondary | ICD-10-CM

## 2020-08-30 NOTE — Patient Instructions (Signed)
Claritin, Zyrtec, Allegra, etc for allergies Delsym and cough drops Humidifier

## 2020-08-30 NOTE — Progress Notes (Addendum)
   Work-in GYN VISIT Patient name: Martha Herrera MRN [id number]  Date of birth: 1995-05-19 Chief Complaint:   Cough  History of Present Illness:   Martha Herrera is a 25 y.o. G36P1001 Caucasian female at [redacted]w[redacted]d  being seen today as work-in for report of cough x 1.5wks. No other sx, no fever/chills/n/v/headache/sob, sore throat. Other family members w/ same sx, all covid neg. Has tried otc delsym which does help. Has not tried allergy meds.     Depression screen [facility] 2/9 07/19/2020 06/08/2020 11/11/2019 09/17/2019 04/30/2019  Decreased Interest 0 0 1 0 0  Down, Depressed, Hopeless 0 0 0 0 0  PHQ - 2 Score 0 0 1 0 0  Altered sleeping 0 3 3 3 1   Tired, decreased energy 0 3 3 3 1   Change in appetite 0 0 0 0 0  Feeling bad or failure about yourself  0 0 0 0 0  Trouble concentrating 0 0 0 0 0  Moving slowly or fidgety/restless 0 0 0 0 0  Suicidal thoughts 0 0 0 0 0  PHQ-9 Score 0 6 7 6 2   Difficult doing work/chores - Not difficult at all Not difficult at all Somewhat difficult Not difficult at all  Some recent data might be hidden    Patient's last menstrual period was 04/24/2020. Review of Systems:   Pertinent items are noted in HPI Denies fever/chills, dizziness, headaches, visual disturbances, fatigue, shortness of breath, chest pain, abdominal pain, vomiting, abnormal vaginal discharge/itching/odor/irritation, problems with periods, bowel movements, urination, or intercourse unless otherwise stated above.  Pertinent History Reviewed:  Reviewed past medical,surgical, social, obstetrical and family history.  Reviewed problem list, medications and allergies. Physical Assessment:   Vitals:   08/30/20 1419 08/30/20 1421  BP: 140/80 116/74  Pulse: 93 88  Temp: 98.6 F (37 C)   Weight: 184 lb 9.6 oz (83.7 kg)   Body mass index is 38.58 kg/m.  O2 sat RA 98%       Physical Examination:   General appearance: alert, well appearing, and in no distress  Mental status: alert, oriented to person,  place, and time  Skin: warm & dry   Cardiovascular: normal heart rate noted, RRR  Respiratory: normal respiratory effort, LCTAB, no distress  Abdomen: soft, non-tender   Pelvic: examination not indicated  Extremities: no edema   Chaperone: N/A    No results found for this or any previous visit (from the past 24 hour(s)).  Assessment & Plan:  1) [redacted]w[redacted]d w/ cough associated w/ seasonal allergies> try otc allergy meds, can continue delsym, cough drops, try humidifier, etc   Meds: No orders of the defined types were placed in this encounter.   No orders of the defined types were placed in this encounter.   Return for As scheduled.  10/30/20 CNM, [facility] 08/30/2020 2:34 PM

## 2020-09-08 ENCOUNTER — Other Ambulatory Visit: Payer: Self-pay | Admitting: Obstetrics & Gynecology

## 2020-09-08 DIAGNOSIS — Z363 Encounter for antenatal screening for malformations: Secondary | ICD-10-CM

## 2020-09-09 ENCOUNTER — Encounter: Payer: Self-pay | Admitting: Women's Health

## 2020-09-09 ENCOUNTER — Ambulatory Visit (INDEPENDENT_AMBULATORY_CARE_PROVIDER_SITE_OTHER): Payer: Medicaid Other | Admitting: Women's Health

## 2020-09-09 ENCOUNTER — Other Ambulatory Visit: Payer: Self-pay

## 2020-09-09 ENCOUNTER — Ambulatory Visit (INDEPENDENT_AMBULATORY_CARE_PROVIDER_SITE_OTHER): Payer: Medicaid Other

## 2020-09-09 VITALS — BP 112/73 | HR 92 | Wt 186.0 lb

## 2020-09-09 DIAGNOSIS — Z3480 Encounter for supervision of other normal pregnancy, unspecified trimester: Secondary | ICD-10-CM

## 2020-09-09 DIAGNOSIS — Z363 Encounter for antenatal screening for malformations: Secondary | ICD-10-CM | POA: Diagnosis not present

## 2020-09-09 DIAGNOSIS — Z3482 Encounter for supervision of other normal pregnancy, second trimester: Secondary | ICD-10-CM

## 2020-09-09 DIAGNOSIS — Z3A19 19 weeks gestation of pregnancy: Secondary | ICD-10-CM | POA: Diagnosis not present

## 2020-09-09 NOTE — Progress Notes (Signed)
[place] 19+5 wks,breech,cx 4.8 cm,posterior placenta gr 0,normal ovaries,svp of fluid 4.8 cm,fhr 152 bpm,LVEICF 2.8 mm,EFW 372 g,anatomy complete

## 2020-09-09 NOTE — Patient Instructions (Signed)
Martha Herrera, I greatly value your feedback.  If you receive a survey following your visit with [place] today, we appreciate you taking the time to fill it out.  Thanks, [name], CNM, WHNP-BC  [facility] ([address] [place], [place] [address]) Entrance C, located off of [address] valet parking  Go to [email or web address] to register for FREE online childbirth classes  [place] [facility]ians/Family Doctors:  [facility] [phone]            [facility] [phone]                 [facility] [phone] (usually not accepting new patients unless you have family there already, you are always welcome to call and ask)       [facility] Department [phone]       [facility] Pediatricians/Family Doctors:   [facility]: [phone]  [facility]: [phone]  [facility]: [phone]  [facility] Doctors:   [facility]: [phone]   [facility]: [phone]  [facility] Doctors:  [facility]: [phone]    Home Blood Pressure Monitoring for Patients   Your provider has recommended that you check your blood pressure (BP) at least once a week at home. If you do not have a blood pressure cuff at home, one will be provided for you. Contact your provider if you have not received your monitor within 1 week.   Helpful Tips for Accurate Home Blood Pressure Checks  . Don't smoke, exercise, or drink caffeine 30 minutes before checking your BP . Use the restroom before checking your BP (a full bladder can raise your pressure) . Relax in a comfortable upright chair . Feet on the ground . Left arm resting comfortably on a flat surface at the level of your heart . Legs uncrossed . Back supported . Sit quietly and don't talk . Place the cuff on your bare arm . Adjust snuggly, so  that only two fingertips can fit between your skin and the top of the cuff . Check 2 readings separated by at least one minute . Keep a log of your BP readings . For a visual, please reference this diagram: http://ccnc.care/bpdiagram  Provider Name: [facility]     Phone: [phone]  Zone 1: ALL CLEAR  Continue to monitor your symptoms:  . BP reading is less than 140 (top number) or less than 90 (bottom number)  . No right upper stomach pain . No headaches or seeing spots . No feeling nauseated or throwing up . No swelling in face and hands  Zone 2: CAUTION Call your doctor's office for any of the following:  . BP reading is greater than 140 (top number) or greater than 90 (bottom number)  . Stomach pain under your ribs in the middle or right side . Headaches or seeing spots . Feeling nauseated or throwing up . Swelling in face and hands  Zone 3: EMERGENCY  Seek immediate medical care if you have any of the following:  . BP reading is greater than160 (top number) or greater than 110 (bottom number) . Severe headaches not improving with Tylenol . Serious difficulty catching your breath . Any worsening symptoms from Zone 2     Second Trimester of Pregnancy The second trimester is from week 14 through week 27 (months 4 through 6). The second trimester is often a time when you feel your  best. Your body has adjusted to being pregnant, and you begin to feel better physically. Usually, morning sickness has lessened or quit completely, you may have more energy, and you may have an increase in appetite. The second trimester is also a time when the fetus is growing rapidly. At the end of the sixth month, the fetus is about 9 inches long and weighs about 1 pounds. You will likely begin to feel the baby move (quickening) between 16 and 20 weeks of pregnancy. Body changes during your second trimester Your body continues to go through many changes during your second trimester. The  changes vary from woman to woman.  Your weight will continue to increase. You will notice your lower abdomen bulging out.  You may begin to get stretch marks on your hips, abdomen, and breasts.  You may develop headaches that can be relieved by medicines. The medicines should be approved by your health care provider.  You may urinate more often because the fetus is pressing on your bladder.  You may develop or continue to have heartburn as a result of your pregnancy.  You may develop constipation because certain hormones are causing the muscles that push waste through your intestines to slow down.  You may develop hemorrhoids or swollen, bulging veins (varicose veins).  You may have back pain. This is caused by: ? Weight gain. ? Pregnancy hormones that are relaxing the joints in your pelvis. ? A shift in weight and the muscles that support your balance.  Your breasts will continue to grow and they will continue to become tender.  Your gums may bleed and may be sensitive to brushing and flossing.  Dark spots or blotches (chloasma, mask of pregnancy) may develop on your face. This will likely fade after the baby is born.  A dark line from your belly button to the pubic area (linea nigra) may appear. This will likely fade after the baby is born.  You may have changes in your hair. These can include thickening of your hair, rapid growth, and changes in texture. Some women also have hair loss during or after pregnancy, or hair that feels dry or thin. Your hair will most likely return to normal after your baby is born.  What to expect at prenatal visits During a routine prenatal visit:  You will be weighed to make sure you and the fetus are growing normally.  Your blood pressure will be taken.  Your abdomen will be measured to track your baby's growth.  The fetal heartbeat will be listened to.  Any test results from the previous visit will be discussed.  Your health care  provider may ask you:  How you are feeling.  If you are feeling the baby move.  If you have had any abnormal symptoms, such as leaking fluid, bleeding, severe headaches, or abdominal cramping.  If you are using any tobacco products, including cigarettes, chewing tobacco, and electronic cigarettes.  If you have any questions.  Other tests that may be performed during your second trimester include:  Blood tests that check for: ? Low iron levels (anemia). ? High blood sugar that affects pregnant women (gestational diabetes) between 71 and 28 weeks. ? Rh antibodies. This is to check for a protein on red blood cells (Rh factor).  Urine tests to check for infections, diabetes, or protein in the urine.  An ultrasound to confirm the proper growth and development of the baby.  An amniocentesis to check for possible genetic problems.  Fetal  screens for spina bifida and Down syndrome.  HIV (human immunodeficiency virus) testing. Routine prenatal testing includes screening for HIV, unless you choose not to have this test.  Follow these instructions at home: Medicines  Follow your health care provider's instructions regarding medicine use. Specific medicines may be either safe or unsafe to take during pregnancy.  Take a prenatal vitamin that contains at least 600 micrograms (mcg) of folic acid.  If you develop constipation, try taking a stool softener if your health care provider approves. Eating and drinking  Eat a balanced diet that includes fresh fruits and vegetables, whole grains, good sources of protein such as meat, eggs, or tofu, and low-fat dairy. Your health care provider will help you determine the amount of weight gain that is right for you.  Avoid raw meat and uncooked cheese. These carry germs that can cause birth defects in the baby.  If you have low calcium intake from food, talk to your health care provider about whether you should take a daily calcium  supplement.  Limit foods that are high in fat and processed sugars, such as fried and sweet foods.  To prevent constipation: ? Drink enough fluid to keep your urine clear or pale yellow. ? Eat foods that are high in fiber, such as fresh fruits and vegetables, whole grains, and beans. Activity  Exercise only as directed by your health care provider. Most women can continue their usual exercise routine during pregnancy. Try to exercise for 30 minutes at least 5 days a week. Stop exercising if you experience uterine contractions.  Avoid heavy lifting, wear low heel shoes, and practice good posture.  A sexual relationship may be continued unless your health care provider directs you otherwise. Relieving pain and discomfort  Wear a good support bra to prevent discomfort from breast tenderness.  Take warm sitz baths to soothe any pain or discomfort caused by hemorrhoids. Use hemorrhoid cream if your health care provider approves.  Rest with your legs elevated if you have leg cramps or low back pain.  If you develop varicose veins, wear support hose. Elevate your feet for 15 minutes, 3-4 times a day. Limit salt in your diet. Prenatal Care  Write down your questions. Take them to your prenatal visits.  Keep all your prenatal visits as told by your health care provider. This is important. Safety  Wear your seat belt at all times when driving.  Make a list of emergency phone numbers, including numbers for family, friends, the hospital, and police and fire departments. General instructions  Ask your health care provider for a referral to a local prenatal education class. Begin classes no later than the beginning of month 6 of your pregnancy.  Ask for help if you have counseling or nutritional needs during pregnancy. Your health care provider can offer advice or refer you to specialists for help with various needs.  Do not use hot tubs, steam rooms, or saunas.  Do not douche or use  tampons or scented sanitary pads.  Do not cross your legs for long periods of time.  Avoid cat litter boxes and soil used by cats. These carry germs that can cause birth defects in the baby and possibly loss of the fetus by miscarriage or stillbirth.  Avoid all smoking, herbs, alcohol, and unprescribed drugs. Chemicals in these products can affect the formation and growth of the baby.  Do not use any products that contain nicotine or tobacco, such as cigarettes and e-cigarettes. If you need help  quitting, ask your health care provider.  Visit your dentist if you have not gone yet during your pregnancy. Use a soft toothbrush to brush your teeth and be gentle when you floss. Contact a health care provider if:  You have dizziness.  You have mild pelvic cramps, pelvic pressure, or nagging pain in the abdominal area.  You have persistent nausea, vomiting, or diarrhea.  You have a bad smelling vaginal discharge.  You have pain when you urinate. Get help right away if:  You have a fever.  You are leaking fluid from your vagina.  You have spotting or bleeding from your vagina.  You have severe abdominal cramping or pain.  You have rapid weight gain or weight loss.  You have shortness of breath with chest pain.  You notice sudden or extreme swelling of your face, hands, ankles, feet, or legs.  You have not felt your baby move in over an hour.  You have severe headaches that do not go away when you take medicine.  You have vision changes. Summary  The second trimester is from week 14 through week 27 (months 4 through 6). It is also a time when the fetus is growing rapidly.  Your body goes through many changes during pregnancy. The changes vary from woman to woman.  Avoid all smoking, herbs, alcohol, and unprescribed drugs. These chemicals affect the formation and growth your baby.  Do not use any tobacco products, such as cigarettes, chewing tobacco, and e-cigarettes. If you  need help quitting, ask your health care provider.  Contact your health care provider if you have any questions. Keep all prenatal visits as told by your health care provider. This is important. This information is not intended to replace advice given to you by your health care provider. Make sure you discuss any questions you have with your health care provider. Document Released: 04/11/2001 Document Revised: 09/23/2015 Document Reviewed: 06/18/2012 Elsevier Interactive Patient Education  2017 [employer]   

## 2020-09-09 NOTE — Progress Notes (Signed)
LOW-RISK PREGNANCY VISIT Patient name: Martha Herrera MRN [id number]  Date of birth: 1995-09-30 Chief Complaint:   Routine Prenatal Visit  History of Present Illness:   Martha Herrera is a 25 y.o. G23P1001 female at [redacted]w[redacted]d with an Estimated Date of Delivery: 01/29/21 being seen today for ongoing management of a low-risk pregnancy.  Depression screen [facility] 2/9 07/19/2020 06/08/2020 11/11/2019 09/17/2019 04/30/2019  Decreased Interest 0 0 1 0 0  Down, Depressed, Hopeless 0 0 0 0 0  PHQ - 2 Score 0 0 1 0 0  Altered sleeping 0 3 3 3 1   Tired, decreased energy 0 3 3 3 1   Change in appetite 0 0 0 0 0  Feeling bad or failure about yourself  0 0 0 0 0  Trouble concentrating 0 0 0 0 0  Moving slowly or fidgety/restless 0 0 0 0 0  Suicidal thoughts 0 0 0 0 0  PHQ-9 Score 0 6 7 6 2   Difficult doing work/chores - Not difficult at all Not difficult at all Somewhat difficult Not difficult at all  Some recent data might be hidden    Today she reports no complaints. Contractions: Not present. Vag. Bleeding: None.  Movement: Present. denies leaking of fluid. Review of Systems:   Pertinent items are noted in HPI Denies abnormal vaginal discharge w/ itching/odor/irritation, headaches, visual changes, shortness of breath, chest pain, abdominal pain, severe nausea/vomiting, or problems with urination or bowel movements unless otherwise stated above. Pertinent History Reviewed:  Reviewed past medical,surgical, social, obstetrical and family history.  Reviewed problem list, medications and allergies. Physical Assessment:   Vitals:   09/09/20 1034  BP: 112/73  Pulse: 92  Weight: 186 lb (84.4 kg)  Body mass index is 38.87 kg/m.        Physical Examination:   General appearance: Well appearing, and in no distress  Mental status: Alert, oriented to person, place, and time  Skin: Warm & dry  Cardiovascular: Normal heart rate noted  Respiratory: Normal respiratory effort, no distress  Abdomen: Soft, gravid,  nontender  Pelvic: Cervical exam deferred         Extremities: Edema: None  Fetal Status: Fetal Heart Rate (bpm): 152 u/s   Movement: Present    19+5 wks,breech,cx 4.8 cm,posterior placenta gr 0,normal ovaries,svp of fluid 4.8 cm,fhr 152 bpm,LVEICF 2.8 mm,EFW 372 g,anatomy complete  Chaperone: N/A   No results found for this or any previous visit (from the past 24 hour(s)).  Assessment & Plan:  1) Low-risk pregnancy G2P1001 at [redacted]w[redacted]d with an Estimated Date of Delivery: 01/29/21   2) Fetal isolated LVEICF, neg NIPS, discussed and gave printed info  3) H/O GDM> early A1C normal, GTT @ 26-28wks  4) Prev c/s   Meds: No orders of the defined types were placed in this encounter.  Labs/procedures today: U/S  Plan:  Continue routine obstetrical care  Next visit: prefers in person    Reviewed: Preterm labor symptoms and general obstetric precautions including but not limited to vaginal bleeding, contractions, leaking of fluid and fetal movement were reviewed in detail with the patient.  All questions were answered. Does have home bp cuff. Office bp cuff given: not applicable. Check bp weekly, let us know if consistently >140 and/or >90.  Follow-up: Return in about 4 weeks (around 10/07/2020) for LROB, CNM, in person.  Future Appointments  Date Time Provider Department Center  10/07/2020  9:10 AM [place], CNM [facility]  11/11/2020 10:00 AM 12/07/2020  M, PA-C [facility]    No orders of the defined types were placed in this encounter.  Cheral Marker CNM, [facility] 09/09/2020 11:01 AM

## 2020-09-26 ENCOUNTER — Other Ambulatory Visit: Payer: Self-pay

## 2020-09-26 ENCOUNTER — Encounter (HOSPITAL_COMMUNITY): Payer: Self-pay | Admitting: Family Medicine

## 2020-09-26 ENCOUNTER — Inpatient Hospital Stay (HOSPITAL_COMMUNITY)
Admission: AD | Admit: 2020-09-26 | Discharge: 2020-09-26 | Disposition: A | Discharge status: Home / Self Care | Payer: Medicaid Other | Attending: Family Medicine | Admitting: Family Medicine

## 2020-09-26 DIAGNOSIS — R109 Unspecified abdominal pain: Secondary | ICD-10-CM | POA: Insufficient documentation

## 2020-09-26 DIAGNOSIS — Z3A22 22 weeks gestation of pregnancy: Secondary | ICD-10-CM | POA: Insufficient documentation

## 2020-09-26 DIAGNOSIS — O26899 Other specified pregnancy related conditions, unspecified trimester: Secondary | ICD-10-CM

## 2020-09-26 DIAGNOSIS — Z79899 Other long term (current) drug therapy: Secondary | ICD-10-CM | POA: Diagnosis not present

## 2020-09-26 DIAGNOSIS — O26892 Other specified pregnancy related conditions, second trimester: Secondary | ICD-10-CM | POA: Diagnosis not present

## 2020-09-26 DIAGNOSIS — Z3480 Encounter for supervision of other normal pregnancy, unspecified trimester: Secondary | ICD-10-CM

## 2020-09-26 DIAGNOSIS — Z3A20 20 weeks gestation of pregnancy: Secondary | ICD-10-CM

## 2020-09-26 LAB — URINALYSIS, ROUTINE W REFLEX MICROSCOPIC
Bilirubin Urine: NEGATIVE
Glucose, UA: NEGATIVE mg/dL
Hgb urine dipstick: NEGATIVE
Ketones, ur: NEGATIVE mg/dL
Leukocytes,Ua: NEGATIVE
Nitrite: NEGATIVE
Protein, ur: NEGATIVE mg/dL
Specific Gravity, Urine: 1.018 (ref 1.005–1.030)
pH: 7 (ref 5.0–8.0)

## 2020-09-26 NOTE — MAU Provider Note (Signed)
Chief Complaint: Abdominal Pain   Event Date/Time   First Provider Initiated Contact with Patient 09/26/20 0601      SUBJECTIVE HPI: Martha Herrera is a 25 y.o. G2P1001 at [redacted]w[redacted]d by LMP who presents to maternity admissions reporting uterine tightening x 24 hours. She reports she woke up Saturday morning and had stomach tightening that feels constant.  She denies vaginal bleeding, vaginal itching/burning, urinary symptoms, h/a, dizziness, n/v, or fever/chills.     Location: lower abdomen Quality: tightening Severity: 6/10 on pain scale Duration: 24 hours Timing: constant Modifying factors: none Associated signs and symptoms: none  HPI  Past Medical History:  Diagnosis Date  . Complication of anesthesia    hard to wake up  . Gastroesophageal reflux    takes prescribed meds daily  . Gestational diabetes   . Headache(784.0)    not as often  . Heart murmur   . Pneumonia 2013  . Varicella    Past Surgical History:  Procedure Laterality Date  . CESAREAN SECTION N/A 08/07/2018   Procedure: CESAREAN SECTION;  Surgeon: Lazaro Arms, MD;  Location: [facility];  Service: Obstetrics;  Laterality: N/A;  . ESOPHAGOGASTRODUODENOSCOPY  05/03/2012   Procedure: ESOPHAGOGASTRODUODENOSCOPY (EGD);  Surgeon: Jon Gills, MD;  Location: [facility] OR;  Service: Gastroenterology;  Laterality: N/A;  . tubes in ears    . WISDOM TOOTH EXTRACTION     Social History   Socioeconomic History  . Marital status: Married    Spouse name: Gem Conkle  . Number of children: 1  . Years of education: Not on file  . Highest education level: Associate degree: academic program  Occupational History  . Not on file  Tobacco Use  . Smoking status: Never Smoker  . Smokeless tobacco: Never Used  Vaping Use  . Vaping Use: Never used  Substance and Sexual Activity  . Alcohol use: No  . Drug use: No  . Sexual activity: Yes    Birth control/protection: None  Other Topics Concern  . Not on file  Social History  Narrative   10th grade   Social Determinants of Health   Financial Resource Strain: Low Risk   . Difficulty of Paying Living Expenses: Not hard at all  Food Insecurity: No Food Insecurity  . Worried About Programme researcher, broadcasting/film/video in the Last Year: Never true  . Ran Out of Food in the Last Year: Never true  Transportation Needs: No Transportation Needs  . Lack of Transportation (Medical): No  . Lack of Transportation (Non-Medical): No  Physical Activity: Inactive  . Days of Exercise per Week: 0 days  . Minutes of Exercise per Session: 0 min  Stress: No Stress Concern Present  . Feeling of Stress : Not at all  Social Connections: Moderately Isolated  . Frequency of Communication with Friends and Family: More than three times a week  . Frequency of Social Gatherings with Friends and Family: Never  . Attends Religious Services: Never  . Active Member of Clubs or Organizations: No  . Attends Banker Meetings: Never  . Marital Status: Married  Catering manager Violence: Not At Risk  . Fear of Current or Ex-Partner: No  . Emotionally Abused: No  . Physically Abused: No  . Sexually Abused: No   No current facility-administered medications on file prior to encounter.   Current Outpatient Medications on File Prior to Encounter  Medication Sig Dispense Refill  . loratadine (CLARITIN) 10 MG tablet Take 10 mg by mouth daily.    Marland Kitchen  omeprazole (PRILOSEC) 40 MG capsule Take 1 capsule (40 mg total) by mouth daily. 30 capsule 6  . Prenatal Vit-Fe Fumarate-FA (PRENATAL VITAMIN PO) Take by mouth.    Marland Kitchen acetaminophen (TYLENOL) 500 MG tablet Take 1,000 mg by mouth every 6 (six) hours as needed.    . Blood Pressure Monitor MISC For regular home bp monitoring during pregnancy 1 each 0  . Doxylamine-Pyridoxine (DICLEGIS) 10-10 MG TBEC Take 2 qhs; may also take one in am and one in afternoon prn nausea (Patient not taking: No sig reported) 120 tablet 6  . ondansetron (ZOFRAN ODT) 4 MG  disintegrating tablet Take 1 tablet (4 mg total) by mouth every 8 (eight) hours as needed for nausea or vomiting. (Patient not taking: No sig reported) 30 tablet 3   Allergies  Allergen Reactions  . Penicillins Hives, Itching and Rash    Did it involve swelling of the face/tongue/throat, SOB, or low BP? No Did it involve sudden or severe rash/hives, skin peeling, or any reaction on the inside of your mouth or nose? No Did you need to seek medical attention at a hospital or doctor's office? No When did it last happen?childhood allergy If all above answers are "NO", may proceed with cephalosporin use.   Marland Kitchen Amoxil [Amoxicillin] Itching and Rash    Did it involve swelling of the face/tongue/throat, SOB, or low BP? No Did it involve sudden or severe rash/hives, skin peeling, or any reaction on the inside of your mouth or nose? No Did you need to seek medical attention at a hospital or doctor's office? No When did it last happen?2019 If all above answers are "NO", may proceed with cephalosporin use.    ROS:  Review of Systems  Constitutional: Negative for chills, fatigue and fever.  Respiratory: Negative for shortness of breath.   Cardiovascular: Negative for chest pain.  Gastrointestinal: Positive for abdominal pain. Negative for nausea and vomiting.  Genitourinary: Negative for difficulty urinating, dysuria, flank pain, pelvic pain, vaginal bleeding, vaginal discharge and vaginal pain.  Neurological: Negative for dizziness and headaches.  Psychiatric/Behavioral: Negative.      I have reviewed patient's Past Medical Hx, Surgical Hx, Family Hx, Social Hx, medications and allergies.   Physical Exam   Patient Vitals for the past 24 hrs:  BP Temp Pulse Resp SpO2 Height Weight  09/26/20 0740 (!) 106/58 -- 84 15 100 % -- --  09/26/20 0459 119/69 98 F (36.7 C) 94 18 -- 4\' 10"  (1.473 m) 84.4 kg   Constitutional: Well-developed, well-nourished female in no acute distress.   Cardiovascular: normal rate Respiratory: normal effort GI: Abd soft, non-tender. Pos BS x 4 MS: Extremities nontender, no edema, normal ROM Neurologic: Alert and oriented x 4.  GU: Neg CVAT.  PELVIC EXAM: Deferred  FHT 156 by doppler  LAB RESULTS Results for orders placed or performed during the hospital encounter of 09/26/20 (from the past 24 hour(s))  Urinalysis, Routine w reflex microscopic Urine, Clean Catch     Status: Abnormal   Collection Time: 09/26/20  5:01 AM  Result Value Ref Range   Color, Urine YELLOW YELLOW   APPearance HAZY (A) CLEAR   Specific Gravity, Urine 1.018 1.005 - 1.030   pH 7.0 5.0 - 8.0   Glucose, UA NEGATIVE NEGATIVE mg/dL   Hgb urine dipstick NEGATIVE NEGATIVE   Bilirubin Urine NEGATIVE NEGATIVE   Ketones, ur NEGATIVE NEGATIVE mg/dL   Protein, ur NEGATIVE NEGATIVE mg/dL   Nitrite NEGATIVE NEGATIVE   Leukocytes,Ua NEGATIVE NEGATIVE  O/Positive/-- (03/21 1108)  IMAGING No results found.  MAU Management/MDM: Orders Placed This Encounter  Procedures  . Urinalysis, Routine w reflex microscopic Urine, Clean Catch  . Discharge patient    No orders of the defined types were placed in this encounter.   Cervix closed/thick/high, so no evidence of preterm labor. No acute abdomen on exam. U/A wnl so no evidence of UTI, no vaginal symptoms and swab not colleted.  Reviewed common causes of cramping in pregnancy including dehydration, constipation, intercourse, etc.  Pt states understanding.  Rest/ice/heat/warm bath/Tylenol/pregnancy support belt. F/U at [facility] as scheduled. Pt discharged with strict return precautions.  ASSESSMENT 1. Abdominal cramping affecting pregnancy   2. Supervision of other normal pregnancy, antepartum   3. [redacted] weeks gestation of pregnancy     PLAN Discharge home Allergies as of 09/26/2020      Reactions   Penicillins Hives, Itching, Rash   Did it involve swelling of the face/tongue/throat, SOB, or low BP? No Did it  involve sudden or severe rash/hives, skin peeling, or any reaction on the inside of your mouth or nose? No Did you need to seek medical attention at a hospital or doctor's office? No When did it last happen?childhood allergy If all above answers are "NO", may proceed with cephalosporin use.   Amoxil [amoxicillin] Itching, Rash   Did it involve swelling of the face/tongue/throat, SOB, or low BP? No Did it involve sudden or severe rash/hives, skin peeling, or any reaction on the inside of your mouth or nose? No Did you need to seek medical attention at a hospital or doctor's office? No When did it last happen?2019 If all above answers are "NO", may proceed with cephalosporin use.      Medication List    TAKE these medications   acetaminophen 500 MG tablet Commonly known as: TYLENOL Take 1,000 mg by mouth every 6 (six) hours as needed.   Blood Pressure Monitor Misc For regular home bp monitoring during pregnancy   Doxylamine-Pyridoxine 10-10 MG Tbec Commonly known as: Diclegis Take 2 qhs; may also take one in am and one in afternoon prn nausea   loratadine 10 MG tablet Commonly known as: CLARITIN Take 10 mg by mouth daily.   omeprazole 40 MG capsule Commonly known as: PRILOSEC Take 1 capsule (40 mg total) by mouth daily.   ondansetron 4 MG disintegrating tablet Commonly known as: Zofran ODT Take 1 tablet (4 mg total) by mouth every 8 (eight) hours as needed for nausea or vomiting.   PRENATAL VITAMIN PO Take by mouth.       Follow-up Information    [facility] Follow up.   Why: As scheduled, return to MAU as needed for emergencies Contact information: [address] [place] [address] [phone]              Sharen Counter Certified Nurse-Midwife 09/26/2020  8:01 AM

## 2020-10-07 ENCOUNTER — Ambulatory Visit (INDEPENDENT_AMBULATORY_CARE_PROVIDER_SITE_OTHER): Payer: Medicaid Other | Admitting: Women's Health

## 2020-10-07 ENCOUNTER — Encounter: Payer: Self-pay | Admitting: Women's Health

## 2020-10-07 ENCOUNTER — Other Ambulatory Visit: Payer: Self-pay

## 2020-10-07 VITALS — BP 128/81 | HR 83 | Wt 184.4 lb

## 2020-10-07 DIAGNOSIS — Z3482 Encounter for supervision of other normal pregnancy, second trimester: Secondary | ICD-10-CM

## 2020-10-07 DIAGNOSIS — Z3A23 23 weeks gestation of pregnancy: Secondary | ICD-10-CM

## 2020-10-07 NOTE — Patient Instructions (Signed)
Martha Herrera, thank you for choosing our office today! We appreciate the opportunity to meet your healthcare needs. You may receive a short survey by mail, e-mail, or through [employer]. If you are happy with your care we would appreciate if you could take just a few minutes to complete the survey questions. We read all of your comments and take your feedback very seriously. Thank you again for choosing our office.  Center for [employer] Team at [facility]  [facility] at [facility] ([address] [place], [place] [address]) Entrance C, located off of [address] Free 24/7 valet parking   You will have your sugar test next visit.  Please do not eat or drink anything after midnight the night before you come, not even water.  You will be here for at least two hours.  Please make an appointment online for the bloodwork at [email or web address] for 8:00am (or as close to this as possible). Make sure you select the [facility] service center.   CLASSES: Go to [email or web address] to register for classes (childbirth, breastfeeding, waterbirth, infant CPR, daddy bootcamp, etc.)  Call the office [phone]) or go to [facility] if: You begin to have strong, frequent contractions Your water breaks.  Sometimes it is a big gush of fluid, sometimes it is just a trickle that keeps getting your panties wet or running down your legs You have vaginal bleeding.  It is normal to have a small amount of spotting if your cervix was checked.  You don't feel your baby moving like normal.  If you don't, get you something to eat and drink and lay down and focus on feeling your baby move.   If your baby is still not moving like normal, you should call the office or go to [facility]  Call the office [phone]) or go to [facility] hospital for these signs of pre-eclampsia: Severe headache that does not go away with Tylenol Visual changes- seeing spots, double, blurred vision Pain under your right breast or upper  abdomen that does not go away with Tums or heartburn medicine Nausea and/or vomiting Severe swelling in your hands, feet, and face    [facility] Pediatricians/Family Doctors [facility]): [address] Colette Ribas, [phone]           [facility]: [address], [phone]                [facility]): [address], [phone] (call to ask if accepting patients) [facility] Department: [address] [place], [phone]    [facility] Pediatricians/Family Doctors [facility]): [address], [phone] [facility]: [address] [place], [phone] [facility]: [address], [phone]  [facility] Doctors  [facility]): [phone] [facility]: [address] [phone]   [facility] Doctors [facility]: [address], [phone]  [facility] Doctors  [facility]: [phone], [phone]  Home Blood Pressure Monitoring for Patients   Your provider has recommended that you check your blood pressure (BP) at least once a week at home. If you do not have a blood pressure cuff at home, one will be provided for you. Contact your provider if you have not received your monitor within 1 week.   Helpful Tips for Accurate Home Blood Pressure Checks  Don't smoke, exercise, or drink  caffeine 30 minutes before checking your BP Use the restroom before checking your BP (a full bladder can raise your pressure) Relax in a comfortable upright chair Feet on the ground Left arm resting comfortably on a flat surface at the level of your heart Legs uncrossed Back supported Sit quietly and don't talk Place the cuff on your bare arm Adjust snuggly, so that only two fingertips can fit between your skin and the top of the cuff Check 2  readings separated by at least one minute Keep a log of your BP readings For a visual, please reference this diagram: http://ccnc.care/bpdiagram  Provider Name: [facility]     Phone: [phone]  Zone 1: ALL CLEAR  Continue to monitor your symptoms:  BP reading is less than 140 (top number) or less than 90 (bottom number)  No right upper stomach pain No headaches or seeing spots No feeling nauseated or throwing up No swelling in face and hands  Zone 2: CAUTION Call your doctor's office for any of the following:  BP reading is greater than 140 (top number) or greater than 90 (bottom number)  Stomach pain under your ribs in the middle or right side Headaches or seeing spots Feeling nauseated or throwing up Swelling in face and hands  Zone 3: EMERGENCY  Seek immediate medical care if you have any of the following:  BP reading is greater than160 (top number) or greater than 110 (bottom number) Severe headaches not improving with Tylenol Serious difficulty catching your breath Any worsening symptoms from Zone 2   Second Trimester of Pregnancy The second trimester is from week 13 through week 28, months 4 through 6. The second trimester is often a time when you feel your best. Your body has also adjusted to being pregnant, and you begin to feel better physically. Usually, morning sickness has lessened or quit completely, you may have more energy, and you may have an increase in appetite. The second trimester is also a time when the fetus is growing rapidly. At the end of the sixth month, the fetus is about 9 inches long and weighs about 1 pounds. You will likely begin to feel the baby move (quickening) between 18 and 20 weeks of the pregnancy. BODY CHANGES Your body goes through many changes during pregnancy. The changes vary from woman to woman.  Your weight will continue to increase. You will notice your lower abdomen bulging out. You may begin to get stretch marks on your  hips, abdomen, and breasts. You may develop headaches that can be relieved by medicines approved by your health care provider. You may urinate more often because the fetus is pressing on your bladder. You may develop or continue to have heartburn as a result of your pregnancy. You may develop constipation because certain hormones are causing the muscles that push waste through your intestines to slow down. You may develop hemorrhoids or swollen, bulging veins (varicose veins). You may have back pain because of the weight gain and pregnancy hormones relaxing your joints between the bones in your pelvis and as a result of a shift in weight and the muscles that support your balance. Your breasts will continue to grow and be tender. Your gums may bleed and may be sensitive to brushing and flossing. Dark spots or blotches (chloasma, mask of pregnancy) may develop on your face. This will likely fade after the baby is born. A dark line from your belly button to the pubic area (linea nigra) may appear. This  will likely fade after the baby is born. You may have changes in your hair. These can include thickening of your hair, rapid growth, and changes in texture. Some women also have hair loss during or after pregnancy, or hair that feels dry or thin. Your hair will most likely return to normal after your baby is born. WHAT TO EXPECT AT YOUR PRENATAL VISITS During a routine prenatal visit: You will be weighed to make sure you and the fetus are growing normally. Your blood pressure will be taken. Your abdomen will be measured to track your baby's growth. The fetal heartbeat will be listened to. Any test results from the previous visit will be discussed. Your health care provider may ask you: How you are feeling. If you are feeling the baby move. If you have had any abnormal symptoms, such as leaking fluid, bleeding, severe headaches, or abdominal cramping. If you have any questions. Other tests that may  be performed during your second trimester include: Blood tests that check for: Low iron levels (anemia). Gestational diabetes (between 24 and 28 weeks). Rh antibodies. Urine tests to check for infections, diabetes, or protein in the urine. An ultrasound to confirm the proper growth and development of the baby. An amniocentesis to check for possible genetic problems. Fetal screens for spina bifida and Down syndrome. HOME CARE INSTRUCTIONS  Avoid all smoking, herbs, alcohol, and unprescribed drugs. These chemicals affect the formation and growth of the baby. Follow your health care provider's instructions regarding medicine use. There are medicines that are either safe or unsafe to take during pregnancy. Exercise only as directed by your health care provider. Experiencing uterine cramps is a good sign to stop exercising. Continue to eat regular, healthy meals. Wear a good support bra for breast tenderness. Do not use hot tubs, steam rooms, or saunas. Wear your seat belt at all times when driving. Avoid raw meat, uncooked cheese, cat litter boxes, and soil used by cats. These carry germs that can cause birth defects in the baby. Take your prenatal vitamins. Try taking a stool softener (if your health care provider approves) if you develop constipation. Eat more high-fiber foods, such as fresh vegetables or fruit and whole grains. Drink plenty of fluids to keep your urine clear or pale yellow. Take warm sitz baths to soothe any pain or discomfort caused by hemorrhoids. Use hemorrhoid cream if your health care provider approves. If you develop varicose veins, wear support hose. Elevate your feet for 15 minutes, 3-4 times a day. Limit salt in your diet. Avoid heavy lifting, wear low heel shoes, and practice good posture. Rest with your legs elevated if you have leg cramps or low back pain. Visit your dentist if you have not gone yet during your pregnancy. Use a soft toothbrush to brush your teeth  and be gentle when you floss. A sexual relationship may be continued unless your health care provider directs you otherwise. Continue to go to all your prenatal visits as directed by your health care provider. SEEK MEDICAL CARE IF:  You have dizziness. You have mild pelvic cramps, pelvic pressure, or nagging pain in the abdominal area. You have persistent nausea, vomiting, or diarrhea. You have a bad smelling vaginal discharge. You have pain with urination. SEEK IMMEDIATE MEDICAL CARE IF:  You have a fever. You are leaking fluid from your vagina. You have spotting or bleeding from your vagina. You have severe abdominal cramping or pain. You have rapid weight gain or loss. You have shortness of   breath with chest pain. You notice sudden or extreme swelling of your face, hands, ankles, feet, or legs. You have not felt your baby move in over an hour. You have severe headaches that do not go away with medicine. You have vision changes. Document Released: 04/11/2001 Document Revised: 04/22/2013 Document Reviewed: 06/18/2012 [facility] Patient Information 2015 [place], [place]. This information is not intended to replace advice given to you by your health care provider. Make sure you discuss any questions you have with your health care provider.

## 2020-10-07 NOTE — Progress Notes (Signed)
LOW-RISK PREGNANCY VISIT Patient name: Martha Herrera MRN [id number]  Date of birth: [birth year] Chief Complaint:   Routine Prenatal Visit  History of Present Illness:   Martha Herrera is a 25 y.o. G57P1001 female at [redacted]w[redacted]d with an Estimated Date of Delivery: 01/29/21 being seen today for ongoing management of a low-risk pregnancy.   Today she reports no complaints. Contractions: Not present. Vag. Bleeding: None.  Movement: Present. denies leaking of fluid.  Depression screen [facility] 2/9 07/19/2020 06/08/2020 11/11/2019 09/17/2019 04/30/2019  Decreased Interest 0 0 1 0 0  Down, Depressed, Hopeless 0 0 0 0 0  PHQ - 2 Score 0 0 1 0 0  Altered sleeping 0 3 3 3 1   Tired, decreased energy 0 3 3 3 1   Change in appetite 0 0 0 0 0  Feeling bad or failure about yourself  0 0 0 0 0  Trouble concentrating 0 0 0 0 0  Moving slowly or fidgety/restless 0 0 0 0 0  Suicidal thoughts 0 0 0 0 0  PHQ-9 Score 0 6 7 6 2   Difficult doing work/chores - Not difficult at all Not difficult at all Somewhat difficult Not difficult at all  Some recent data might be hidden     GAD 7 : Generalized Anxiety Score 07/19/2020 04/30/2019 02/26/2019  Nervous, Anxious, on Edge 0 0 2  Control/stop worrying 0 0 2  Worry too much - different things 0 0 2  Trouble relaxing 0 0 3  Restless 0 0 1  Easily annoyed or irritable 0 0 3  Afraid - awful might happen 0 0 0  Total GAD 7 Score 0 0 13  Anxiety Difficulty - Not difficult at all Very difficult      Review of Systems:   Pertinent items are noted in HPI Denies abnormal vaginal discharge w/ itching/odor/irritation, headaches, visual changes, shortness of breath, chest pain, abdominal pain, severe nausea/vomiting, or problems with urination or bowel movements unless otherwise stated above. Pertinent History Reviewed:  Reviewed past medical,surgical, social, obstetrical and family history.  Reviewed problem list, medications and allergies. Physical Assessment:   Vitals:    10/07/20 0926  BP: 128/81  Pulse: 83  Weight: 184 lb 6.4 oz (83.6 kg)  Body mass index is 38.54 kg/m.        Physical Examination:   General appearance: Well appearing, and in no distress  Mental status: Alert, oriented to person, place, and time  Skin: Warm & dry  Cardiovascular: Normal heart rate noted  Respiratory: Normal respiratory effort, no distress  Abdomen: Soft, gravid, nontender  Pelvic: Cervical exam deferred         Extremities: Edema: None  Fetal Status: Fetal Heart Rate (bpm): 152 Fundal Height: 24 cm Movement: Present    Chaperone: N/A   No results found for this or any previous visit (from the past 24 hour(s)).  Assessment & Plan:  1) Low-risk pregnancy G2P1001 at [redacted]w[redacted]d with an Estimated Date of Delivery: 01/29/21   2) Prev c/s, wants RCS  3) H/O GDM> early A1C 4.9, GTT next visit   Meds: No orders of the defined types were placed in this encounter.  Labs/procedures today: none  Plan:  Continue routine obstetrical care  Next visit: prefers will be in person for pn2     Reviewed: Preterm labor symptoms and general obstetric precautions including but not limited to vaginal bleeding, contractions, leaking of fluid and fetal movement were reviewed in detail with the patient.  All questions were answered. Does have home bp cuff. Office bp cuff given: not applicable. Check bp weekly, let us know if consistently >140 and/or >90.  Follow-up: Return in about 4 weeks (around 11/04/2020) for LROB, PN2, CNM, in person.  Future Appointments  Date Time Provider Department Center  11/11/2020 10:00 AM Trey Sailors, PA-C [facility]    No orders of the defined types were placed in this encounter.  Cheral Marker CNM, [facility] 10/07/2020 9:36 AM

## 2020-11-04 ENCOUNTER — Other Ambulatory Visit: Payer: Medicaid Other

## 2020-11-04 ENCOUNTER — Other Ambulatory Visit: Payer: Self-pay

## 2020-11-04 ENCOUNTER — Ambulatory Visit (INDEPENDENT_AMBULATORY_CARE_PROVIDER_SITE_OTHER): Payer: Medicaid Other | Admitting: Advanced Practice Midwife

## 2020-11-04 VITALS — BP 118/80 | HR 87 | Wt 190.0 lb

## 2020-11-04 DIAGNOSIS — Z3A27 27 weeks gestation of pregnancy: Secondary | ICD-10-CM

## 2020-11-04 DIAGNOSIS — Z3482 Encounter for supervision of other normal pregnancy, second trimester: Secondary | ICD-10-CM

## 2020-11-04 DIAGNOSIS — Z131 Encounter for screening for diabetes mellitus: Secondary | ICD-10-CM

## 2020-11-04 DIAGNOSIS — Z3480 Encounter for supervision of other normal pregnancy, unspecified trimester: Secondary | ICD-10-CM | POA: Diagnosis not present

## 2020-11-04 DIAGNOSIS — Z23 Encounter for immunization: Secondary | ICD-10-CM | POA: Diagnosis not present

## 2020-11-04 NOTE — Progress Notes (Signed)
   LOW-RISK PREGNANCY VISIT Patient name: Martha Herrera MRN [id number]  Date of birth: 04-24-1996 Chief Complaint:   Routine Prenatal Visit  History of Present Illness:   Martha Herrera is a 25 y.o. G75P1001 female at [redacted]w[redacted]d with an Estimated Date of Delivery: 01/29/21 being seen today for ongoing management of a low-risk pregnancy.  Today she reports no complaints. Contractions: Irritability. Vag. Bleeding: None.  Movement: Present. denies leaking of fluid. Review of Systems:   Pertinent items are noted in HPI Denies abnormal vaginal discharge w/ itching/odor/irritation, headaches, visual changes, shortness of breath, chest pain, abdominal pain, severe nausea/vomiting, or problems with urination or bowel movements unless otherwise stated above. Pertinent History Reviewed:  Reviewed past medical,surgical, social, obstetrical and family history.  Reviewed problem list, medications and allergies. Physical Assessment:   Vitals:   11/04/20 0853  BP: 118/80  Pulse: 87  Weight: 190 lb (86.2 kg)  Body mass index is 39.71 kg/m.        Physical Examination:   General appearance: Well appearing, and in no distress  Mental status: Alert, oriented to person, place, and time  Skin: Warm & dry  Cardiovascular: Normal heart rate noted  Respiratory: Normal respiratory effort, no distress  Abdomen: Soft, gravid, nontender  Pelvic: Cervical exam deferred         Extremities: Edema: None  Fetal Status: Fetal Heart Rate (bpm): 140 Fundal Height: 29 cm Movement: Present    Chaperone: n/a    No results found for this or any previous visit (from the past 24 hour(s)).  Assessment & Plan:  1) Low-risk pregnancy G2P1001 at [redacted]w[redacted]d with an Estimated Date of Delivery: 01/29/21   2) hx cx, plans repeat, sign papers today   Meds: No orders of the defined types were placed in this encounter.  Labs/procedures today: PN2  Plan:  Continue routine obstetrical care  Next visit: prefers in person    Reviewed:  Preterm labor symptoms and general obstetric precautions including but not limited to vaginal bleeding, contractions, leaking of fluid and fetal movement were reviewed in detail with the patient.  All questions were answered. Has home bp cuff.. Check bp weekly, let us know if >140/90.   Follow-up: Return in about 3 weeks (around 11/25/2020) for LROB w/LHE schedule CS, sign BTL form.  Orders Placed This Encounter  Procedures   Tdap vaccine greater than or equal to 7yo IM   Jacklyn Shell DNP, CNM 11/04/2020 9:26 AM

## 2020-11-04 NOTE — Patient Instructions (Signed)
Martha Herrera, I greatly value your feedback.  If you receive a survey following your visit with [place] today, we appreciate you taking the time to fill it out.  Thanks, Cathie Beams, CNM   [facility] HAS MOVED!!! It is now [facility] ([address] [place], [place] [address]) Entrance located off of [address] Free 24/7 valet parking   Go to [email or web address] to register for FREE online childbirth classes    Call the office [phone]) or go to [facility] if: You begin to have strong, frequent contractions Your water breaks.  Sometimes it is a big gush of fluid, sometimes it is just a trickle that keeps getting your panties wet or running down your legs You have vaginal bleeding.  It is normal to have a small amount of spotting if your cervix was checked.  You don't feel your baby moving like normal.  If you don't, get you something to eat and drink and lay down and focus on feeling your baby move.  You should feel at least 10 movements in 2 hours.  If you don't, you should call the office or go to [facility].    Tdap Vaccine It is recommended that you get the Tdap vaccine during the third trimester of EACH pregnancy to help protect your baby from getting pertussis (whooping cough) 27-36 weeks is the BEST time to do this so that you can pass the protection on to your baby. During pregnancy is better than after pregnancy, but if you are unable to get it during pregnancy it will be offered at the hospital.  You will be offered this vaccine in the office after 27 weeks. If you do not have health insurance, you can get this vaccine at the health department or your family doctor Everyone who will be around your baby should also be up-to-date on their vaccines. Adults (who are not pregnant) only need 1 dose of Tdap during adulthood.   Third Trimester of Pregnancy The third trimester is from week 29 through week 42, months 7 through 9. The third trimester  is a time when the fetus is growing rapidly. At the end of the ninth month, the fetus is about 20 inches in length and weighs 6-10 pounds.  BODY CHANGES Your body goes through many changes during pregnancy. The changes vary from woman to woman.  Your weight will continue to increase. You can expect to gain 25-35 pounds (11-16 kg) by the end of the pregnancy. You may begin to get stretch marks on your hips, abdomen, and breasts. You may urinate more often because the fetus is moving lower into your pelvis and pressing on your bladder. You may develop or continue to have heartburn as a result of your pregnancy. You may develop constipation because certain hormones are causing the muscles that push waste through your intestines to slow down. You may develop hemorrhoids or swollen, bulging veins (varicose veins). You may have pelvic pain because of the weight gain and pregnancy hormones relaxing your joints between the bones in your pelvis. Backaches may result from overexertion of the muscles supporting your posture. You may have changes in your hair. These can include thickening of your hair, rapid growth, and changes in texture. Some women also have hair loss during or after pregnancy, or hair that feels dry or thin. Your hair will most likely return to normal after your baby is born. Your breasts will continue to grow and be tender. A  yellow discharge may leak from your breasts called colostrum. Your belly button may stick out. You may feel short of breath because of your expanding uterus. You may notice the fetus "dropping," or moving lower in your abdomen. You may have a bloody mucus discharge. This usually occurs a few days to a week before labor begins. Your cervix becomes thin and soft (effaced) near your due date. WHAT TO EXPECT AT YOUR PRENATAL EXAMS  You will have prenatal exams every 2 weeks until week 36. Then, you will have weekly prenatal exams. During a routine prenatal visit: You  will be weighed to make sure you and the fetus are growing normally. Your blood pressure is taken. Your abdomen will be measured to track your baby's growth. The fetal heartbeat will be listened to. Any test results from the previous visit will be discussed. You may have a cervical check near your due date to see if you have effaced. At around 36 weeks, your caregiver will check your cervix. At the same time, your caregiver will also perform a test on the secretions of the vaginal tissue. This test is to determine if a type of bacteria, Group B streptococcus, is present. Your caregiver will explain this further. Your caregiver may ask you: What your birth plan is. How you are feeling. If you are feeling the baby move. If you have had any abnormal symptoms, such as leaking fluid, bleeding, severe headaches, or abdominal cramping. If you have any questions. Other tests or screenings that may be performed during your third trimester include: Blood tests that check for low iron levels (anemia). Fetal testing to check the health, activity level, and growth of the fetus. Testing is done if you have certain medical conditions or if there are problems during the pregnancy. FALSE LABOR You may feel small, irregular contractions that eventually go away. These are called Braxton Hicks contractions, or false labor. Contractions may last for hours, days, or even weeks before true labor sets in. If contractions come at regular intervals, intensify, or become painful, it is best to be seen by your caregiver.  SIGNS OF LABOR  Menstrual-like cramps. Contractions that are 5 minutes apart or less. Contractions that start on the top of the uterus and spread down to the lower abdomen and back. A sense of increased pelvic pressure or back pain. A watery or bloody mucus discharge that comes from the vagina. If you have any of these signs before the 37th week of pregnancy, call your caregiver right away. You need to  go to the hospital to get checked immediately. HOME CARE INSTRUCTIONS  Avoid all smoking, herbs, alcohol, and unprescribed drugs. These chemicals affect the formation and growth of the baby. Follow your caregiver's instructions regarding medicine use. There are medicines that are either safe or unsafe to take during pregnancy. Exercise only as directed by your caregiver. Experiencing uterine cramps is a good sign to stop exercising. Continue to eat regular, healthy meals. Wear a good support bra for breast tenderness. Do not use hot tubs, steam rooms, or saunas. Wear your seat belt at all times when driving. Avoid raw meat, uncooked cheese, cat litter boxes, and soil used by cats. These carry germs that can cause birth defects in the baby. Take your prenatal vitamins. Try taking a stool softener (if your caregiver approves) if you develop constipation. Eat more high-fiber foods, such as fresh vegetables or fruit and whole grains. Drink plenty of fluids to keep your urine clear or pale yellow.  Take warm sitz baths to soothe any pain or discomfort caused by hemorrhoids. Use hemorrhoid cream if your caregiver approves. If you develop varicose veins, wear support hose. Elevate your feet for 15 minutes, 3-4 times a day. Limit salt in your diet. Avoid heavy lifting, wear low heal shoes, and practice good posture. Rest a lot with your legs elevated if you have leg cramps or low back pain. Visit your dentist if you have not gone during your pregnancy. Use a soft toothbrush to brush your teeth and be gentle when you floss. A sexual relationship may be continued unless your caregiver directs you otherwise. Do not travel far distances unless it is absolutely necessary and only with the approval of your caregiver. Take prenatal classes to understand, practice, and ask questions about the labor and delivery. Make a trial run to the hospital. Pack your hospital bag. Prepare the baby's nursery. Continue to  go to all your prenatal visits as directed by your caregiver. SEEK MEDICAL CARE IF: You are unsure if you are in labor or if your water has broken. You have dizziness. You have mild pelvic cramps, pelvic pressure, or nagging pain in your abdominal area. You have persistent nausea, vomiting, or diarrhea. You have a bad smelling vaginal discharge. You have pain with urination. SEEK IMMEDIATE MEDICAL CARE IF:  You have a fever. You are leaking fluid from your vagina. You have spotting or bleeding from your vagina. You have severe abdominal cramping or pain. You have rapid weight loss or gain. You have shortness of breath with chest pain. You notice sudden or extreme swelling of your face, hands, ankles, feet, or legs. You have not felt your baby move in over an hour. You have severe headaches that do not go away with medicine. You have vision changes. Document Released: 04/11/2001 Document Revised: 04/22/2013 Document Reviewed: 06/18/2012 [facility] Patient Information 2015 [place], [place]. This information is not intended to replace advice given to you by your health care provider. Make sure you discuss any questions you have with your health care provider.

## 2020-11-05 ENCOUNTER — Telehealth: Payer: Self-pay | Admitting: Women's Health

## 2020-11-05 LAB — CBC
Hematocrit: 34.3 % (ref 34.0–46.6)
Hemoglobin: 11.6 g/dL (ref 11.1–15.9)
MCH: 27.8 pg (ref 26.6–33.0)
MCHC: 33.8 g/dL (ref 31.5–35.7)
MCV: 82 fL (ref 79–97)
Platelets: 253 10*3/uL (ref 150–450)
RBC: 4.18 x10E6/uL (ref 3.77–5.28)
RDW: 12.4 % (ref 11.7–15.4)
WBC: 9.2 10*3/uL (ref 3.4–10.8)

## 2020-11-05 LAB — HIV ANTIBODY (ROUTINE TESTING W REFLEX): HIV Screen 4th Generation wRfx: NONREACTIVE

## 2020-11-05 LAB — ANTIBODY SCREEN: Antibody Screen: NEGATIVE

## 2020-11-05 LAB — GLUCOSE TOLERANCE, 2 HOURS W/ 1HR
Glucose, 1 hour: 138 mg/dL (ref 65–179)
Glucose, 2 hour: 116 mg/dL (ref 65–152)
Glucose, Fasting: 78 mg/dL (ref 65–91)

## 2020-11-05 LAB — RPR: RPR Ser Ql: NONREACTIVE

## 2020-11-05 NOTE — Telephone Encounter (Signed)
Pt would like a call back regarding her PN2 results  Please advise & notify pt

## 2020-11-05 NOTE — Telephone Encounter (Signed)
Patient reviewed results on mychart and wants to make sure she passed her glucola.  Pt informed she passed.

## 2020-11-11 ENCOUNTER — Encounter: Payer: Self-pay | Admitting: Physician Assistant

## 2020-11-29 ENCOUNTER — Ambulatory Visit (INDEPENDENT_AMBULATORY_CARE_PROVIDER_SITE_OTHER): Payer: Medicaid Other | Admitting: Obstetrics & Gynecology

## 2020-11-29 ENCOUNTER — Encounter: Payer: Self-pay | Admitting: Obstetrics & Gynecology

## 2020-11-29 ENCOUNTER — Other Ambulatory Visit: Payer: Self-pay

## 2020-11-29 VITALS — BP 118/87 | HR 100 | Wt 189.0 lb

## 2020-11-29 DIAGNOSIS — Z3480 Encounter for supervision of other normal pregnancy, unspecified trimester: Secondary | ICD-10-CM

## 2020-11-29 DIAGNOSIS — Z3A31 31 weeks gestation of pregnancy: Secondary | ICD-10-CM

## 2020-11-29 NOTE — Progress Notes (Signed)
   LOW-RISK PREGNANCY VISIT Patient name: Martha Herrera MRN [id number]  Date of birth: July 17, 1995 Chief Complaint:   Routine Prenatal Visit  History of Present Illness:   Martha Herrera is a 25 y.o. G6P1001 female at [redacted]w[redacted]d with an Estimated Date of Delivery: 01/29/21 being seen today for ongoing management of a low-risk pregnancy.  Depression screen [facility] 2/9 11/04/2020 07/19/2020 06/08/2020 11/11/2019 09/17/2019  Decreased Interest 0 0 0 1 0  Down, Depressed, Hopeless 0 0 0 0 0  PHQ - 2 Score 0 0 0 1 0  Altered sleeping 3 0 3 3 3   Tired, decreased energy 1 0 3 3 3   Change in appetite 0 0 0 0 0  Feeling bad or failure about yourself  0 0 0 0 0  Trouble concentrating 0 0 0 0 0  Moving slowly or fidgety/restless 0 0 0 0 0  Suicidal thoughts 0 0 0 0 0  PHQ-9 Score 4 0 6 7 6   Difficult doing work/chores - - Not difficult at all Not difficult at all Somewhat difficult  Some recent data might be hidden    Today she reports no complaints. Contractions: Irregular. Vag. Bleeding: None.  Movement: Present. denies leaking of fluid. Review of Systems:   Pertinent items are noted in HPI Denies abnormal vaginal discharge w/ itching/odor/irritation, headaches, visual changes, shortness of breath, chest pain, abdominal pain, severe nausea/vomiting, or problems with urination or bowel movements unless otherwise stated above. Pertinent History Reviewed:  Reviewed past medical,surgical, social, obstetrical and family history.  Reviewed problem list, medications and allergies. Physical Assessment:   Vitals:   11/29/20 1039  BP: 118/87  Pulse: 100  Weight: 189 lb (85.7 kg)  Body mass index is 39.5 kg/m.        Physical Examination:   General appearance: Well appearing, and in no distress  Mental status: Alert, oriented to person, place, and time  Skin: Warm & dry  Cardiovascular: Normal heart rate noted  Respiratory: Normal respiratory effort, no distress  Abdomen: Soft, gravid, nontender  Pelvic:  Cervical exam deferred         Extremities: Edema: None  Fetal Status: Fetal Heart Rate (bpm): 144 Fundal Height: 32 cm Movement: Present    Chaperone: n/a    No results found for this or any previous visit (from the past 24 hour(s)).  Assessment & Plan:  1) Low-risk pregnancy G2P1001 at [redacted]w[redacted]d with an Estimated Date of Delivery: 01/29/21   2) Repeat C section, declines TOL,   Wants BTL   Meds: No orders of the defined types were placed in this encounter.  Labs/procedures today:   Plan:  Continue routine obstetrical care  Next visit: prefers in person    Reviewed: Preterm labor symptoms and general obstetric precautions including but not limited to vaginal bleeding, contractions, leaking of fluid and fetal movement were reviewed in detail with the patient.  All questions were answered. Has home bp cuff. Rx faxed to . Check bp weekly, let 01/29/21 know if >140/90.   Follow-up: Return in about 3 weeks (around 12/20/2020) for LROB.  No orders of the defined types were placed in this encounter.   03/31/21, MD 11/29/2020 10:55 AM

## 2020-12-03 ENCOUNTER — Telehealth: Payer: Self-pay

## 2020-12-03 NOTE — Telephone Encounter (Signed)
Pt called stating that she was having less fetal movement since Tuesday. Pt admits to at least 10 movements every 2 hours, per kick count. She denies any leakage of fluid or spotting/bleeding. She c/o intermittent cramping and admits she only drinks 5-6 bottles of water daily. She was instructed to drink something sweet to see if baby movement increases and call us back. She was also instructed to drink at least 8 bottles of water daily to help with contractions from non-adequate hydration. Pt confirmed understanding.

## 2020-12-04 DIAGNOSIS — Z3A32 32 weeks gestation of pregnancy: Secondary | ICD-10-CM | POA: Diagnosis not present

## 2020-12-04 DIAGNOSIS — L600 Ingrowing nail: Secondary | ICD-10-CM | POA: Diagnosis not present

## 2020-12-04 DIAGNOSIS — Z880 Allergy status to penicillin: Secondary | ICD-10-CM | POA: Diagnosis not present

## 2020-12-04 DIAGNOSIS — O99713 Diseases of the skin and subcutaneous tissue complicating pregnancy, third trimester: Secondary | ICD-10-CM | POA: Diagnosis not present

## 2020-12-06 ENCOUNTER — Telehealth: Payer: Self-pay

## 2020-12-06 NOTE — Telephone Encounter (Signed)
Transition Care Management Follow-up Telephone Call Date of discharge and from where: 12/05/2020-[facility]  How have you been since you were released from the hospital? Patient stated she is doing a lot better.  Any questions or concerns? No  Items Reviewed: Did the pt receive and understand the discharge instructions provided? Yes  Medications obtained and verified? Yes  Other? No  Any new allergies since your discharge? No  Dietary orders reviewed? N/A Do you have support at home? Yes   Home Care and Equipment/Supplies: Were home health services ordered? not applicable If so, what is the name of the agency? N/A  Has the agency set up a time to come to the patient's home? not applicable Were any new equipment or medical supplies ordered?  No What is the name of the medical supply agency? N/A Were you able to get the supplies/equipment? not applicable Do you have any questions related to the use of the equipment or supplies? No  Functional Questionnaire: (I = Independent and D = Dependent) ADLs: I  Bathing/Dressing- I  Meal Prep- I  Eating- I  Maintaining continence- I  Transferring/Ambulation- I  Managing Meds- I  Follow up appointments reviewed:  PCP Hospital f/u appt confirmed? No   Specialist Hospital f/u appt confirmed? No   Are transportation arrangements needed? No  If their condition worsens, is the pt aware to call PCP or go to the Emergency Dept.? Yes Was the patient provided with contact information for the PCP's office or ED? Yes Was to pt encouraged to call back with questions or concerns? Yes

## 2020-12-07 ENCOUNTER — Telehealth: Payer: Self-pay | Admitting: Women's Health

## 2020-12-07 NOTE — Telephone Encounter (Signed)
Pt called concerned with possible yeast  Explained that I spoke with the nurse & she recommended using a 7 day OTC treatment  Pt wants to know if it will interfere with the 4 remaining days of her antibiotics  Please advise & notify pt    [facility]

## 2020-12-08 ENCOUNTER — Other Ambulatory Visit: Payer: Self-pay

## 2020-12-08 ENCOUNTER — Ambulatory Visit: Payer: Medicaid Other | Admitting: Podiatry

## 2020-12-08 DIAGNOSIS — L600 Ingrowing nail: Secondary | ICD-10-CM | POA: Diagnosis not present

## 2020-12-08 DIAGNOSIS — L03031 Cellulitis of right toe: Secondary | ICD-10-CM | POA: Diagnosis not present

## 2020-12-08 NOTE — Patient Instructions (Signed)

## 2020-12-08 NOTE — Progress Notes (Signed)
  Subjective:  Patient ID: Martha Herrera, female    DOB: 1995/06/09,  MRN: [id number]  Chief Complaint  Patient presents with   Ingrown Toenail    Right great toenail medial border, infected, taking antibiotics since Saturday    25 y.o. female presents with the above complaint. History confirmed with patient.  She was seen at the ER on Saturday they gave her Keflex.  She is [redacted] weeks pregnant  Objective:  Physical Exam: warm, good capillary refill, no trophic changes or ulcerative lesions, normal DP and PT pulses, and normal sensory exam. Left Foot: normal exam, no swelling, tenderness, instability; ligaments intact, full range of motion of all ankle/foot joints Right Foot: Paronychia right hallux nail with severe ingrown  Assessment:   1. Ingrowing right great toenail   2. Paronychia of great toe, right      Plan:  Patient was evaluated and treated and all questions answered.    Ingrown Nail, right -Patient elects to proceed with minor surgery to remove ingrown toenail today. Consent reviewed and signed by patient. -Ingrown nail excised. See procedure note. -Educated on post-procedure care including soaking. Written instructions provided and reviewed. -Recommended total temporary nail avulsion today due to severity and her current pregnancy status.  If this recurs would perform partial permanent nail avulsion  Procedure: Excision of Ingrown Toenail Location: Right 1st toe medial nail borders. Anesthesia: Lidocaine 1% plain; 1.5 mL and Marcaine 0.5% plain; 1.5 mL, digital block. Skin Prep: Betadine. Dressing: Silvadene; telfa; dry, sterile, compression dressing. Technique: Following skin prep, the toe was exsanguinated and a tourniquet was secured at the base of the toe. The affected nail was freed, s and excised. The tourniquet was then removed and sterile dressing applied. Disposition: Patient tolerated procedure well.  Return if symptoms worsen or fail to improve.

## 2020-12-10 ENCOUNTER — Encounter: Payer: Self-pay | Admitting: Podiatry

## 2020-12-21 ENCOUNTER — Encounter: Payer: Self-pay | Admitting: Women's Health

## 2020-12-21 ENCOUNTER — Other Ambulatory Visit: Payer: Self-pay

## 2020-12-21 ENCOUNTER — Ambulatory Visit (INDEPENDENT_AMBULATORY_CARE_PROVIDER_SITE_OTHER): Payer: Medicaid Other | Admitting: Women's Health

## 2020-12-21 VITALS — BP 129/85 | HR 84 | Wt 192.0 lb

## 2020-12-21 DIAGNOSIS — Z3483 Encounter for supervision of other normal pregnancy, third trimester: Secondary | ICD-10-CM

## 2020-12-21 DIAGNOSIS — Z3A34 34 weeks gestation of pregnancy: Secondary | ICD-10-CM

## 2020-12-21 DIAGNOSIS — Z3480 Encounter for supervision of other normal pregnancy, unspecified trimester: Secondary | ICD-10-CM

## 2020-12-21 NOTE — Patient Instructions (Signed)
Claiborne Billings, thank you for choosing our office today! We appreciate the opportunity to meet your healthcare needs. You may receive a short survey by mail, e-mail, or through [employer]. If you are happy with your care we would appreciate if you could take just a few minutes to complete the survey questions. We read all of your comments and take your feedback very seriously. Thank you again for choosing our office.  Center for [employer] Team at [place] [facility] ([place], [place] [id number]) Entrance C, located off of [address] parking   CLASSES: Go to [email or web address] to register for classes (childbirth, breastfeeding, waterbirth, infant CPR, daddy bootcamp, etc.)  Call the office [phone]) or go to [facility] if: You begin to have strong, frequent contractions Your water breaks.  Sometimes it is a big gush of fluid, sometimes it is just a trickle that keeps getting your panties wet or running down your legs You have vaginal bleeding.  It is normal to have a small amount of spotting if your cervix was checked.  You don't feel your baby moving like normal.  If you don't, get you something to eat and drink and lay down and focus on feeling your baby move.   If your baby is still not moving like normal, you should call the office or go to [facility].  Call the office [phone]) or go to [facility] hospital for these signs of pre-eclampsia: Severe headache that does not go away with Tylenol Visual changes- seeing spots, double, blurred vision Pain under your right breast or upper abdomen that does not go away with Tums or heartburn medicine Nausea and/or vomiting Severe swelling in your hands, feet, and face   Tdap Vaccine It is recommended that you get the Tdap vaccine during the third trimester of EACH pregnancy to help protect your baby from getting pertussis (whooping cough) 27-36 weeks is the BEST time to do  this so that you can pass the protection on to your baby. During pregnancy is better than after pregnancy, but if you are unable to get it during pregnancy it will be offered at the hospital.  You can get this vaccine with [place], at the health department, your family doctor, or some local pharmacies Everyone who will be around your baby should also be up-to-date on their vaccines before the baby comes. Adults (who are not pregnant) only need 1 dose of Tdap during adulthood.   [facility] Pediatricians/Family Doctors [facility]): [address] [place], [facility]: [address] [place], [phone]                [facility]): [place], [phone] (call to ask if accepting patients) [facility] Department: [address], [place], [place] Pediatricians/Family Doctors [facility]): [place] [place], [address], [facility]: [address] [place], [place] [facility]: [place], [place] [facility]): [phone] [facility]: [address], [place]: [address] [place] [facility]: [phone], [phone]  Home Blood Pressure Monitoring for Patients   Your provider has recommended that you check your  blood pressure (BP) at least once a week at home. If you do not have a blood pressure cuff at home, one will be provided for you. Contact your provider if you have not received your monitor within 1 week.   Helpful Tips for Accurate Home Blood Pressure Checks  Don't smoke, exercise, or drink caffeine 30 minutes before checking your BP Use the restroom before checking your BP (a full bladder can raise your  pressure) Relax in a comfortable upright chair Feet on the ground Left arm resting comfortably on a flat surface at the level of your heart Legs uncrossed Back supported Sit quietly and don't talk Place the cuff on your bare arm Adjust snuggly, so that only two fingertips can fit between your skin and the top of the cuff Check 2 readings separated by at least one minute Keep a log of your BP readings For a visual, please reference this diagram: http://ccnc.care/bpdiagram  Provider Name: [facility]     Phone: [phone]  Zone 1: ALL CLEAR  Continue to monitor your symptoms:  BP reading is less than 140 (top number) or less than 90 (bottom number)  No right upper stomach pain No headaches or seeing spots No feeling nauseated or throwing up No swelling in face and hands  Zone 2: CAUTION Call your doctor's office for any of the following:  BP reading is greater than 140 (top number) or greater than 90 (bottom number)  Stomach pain under your ribs in the middle or right side Headaches or seeing spots Feeling nauseated or throwing up Swelling in face and hands  Zone 3: EMERGENCY  Seek immediate medical care if you have any of the following:  BP reading is greater than160 (top number) or greater than 110 (bottom number) Severe headaches not improving with Tylenol Serious difficulty catching your breath Any worsening symptoms from Zone 2  Preterm Labor and Birth Information  The normal length of a pregnancy is 39-41 weeks. Preterm labor is when labor starts before 37 completed weeks of pregnancy. What are the risk factors for preterm labor? Preterm labor is more likely to occur in women who: Have certain infections during pregnancy such as a bladder infection, sexually transmitted infection, or infection inside the uterus (chorioamnionitis). Have a shorter-than-normal cervix. Have gone into preterm labor before. Have had surgery on their cervix. Are younger than age 17  or older than age 35. Are African American. Are pregnant with twins or multiple babies (multiple gestation). Take street drugs or smoke while pregnant. Do not gain enough weight while pregnant. Became pregnant shortly after having been pregnant. What are the symptoms of preterm labor? Symptoms of preterm labor include: Cramps similar to those that can happen during a menstrual period. The cramps may happen with diarrhea. Pain in the abdomen or lower back. Regular uterine contractions that may feel like tightening of the abdomen. A feeling of increased pressure in the pelvis. Increased watery or bloody mucus discharge from the vagina. Water breaking (ruptured amniotic sac). Why is it important to recognize signs of preterm labor? It is important to recognize signs of preterm labor because babies who are born prematurely may not be fully developed. This can put them at an increased risk for: Long-term (chronic) heart and lung problems. Difficulty immediately after birth with regulating body systems, including blood sugar, body temperature, heart rate, and breathing rate. Bleeding in the brain. Cerebral palsy. Learning difficulties. Death. These risks are highest for babies who are born before 34 weeks   of pregnancy. How is preterm labor treated? Treatment depends on the length of your pregnancy, your condition, and the health of your baby. It may involve: Having a stitch (suture) placed in your cervix to prevent your cervix from opening too early (cerclage). Taking or being given medicines, such as: Hormone medicines. These may be given early in pregnancy to help support the pregnancy. Medicine to stop contractions. Medicines to help mature the baby's lungs. These may be prescribed if the risk of delivery is high. Medicines to prevent your baby from developing cerebral palsy. If the labor happens before 34 weeks of pregnancy, you may need to stay in the hospital. What should I do if I  think I am in preterm labor? If you think that you are going into preterm labor, call your health care provider right away. How can I prevent preterm labor in future pregnancies? To increase your chance of having a full-term pregnancy: Do not use any tobacco products, such as cigarettes, chewing tobacco, and e-cigarettes. If you need help quitting, ask your health care provider. Do not use street drugs or medicines that have not been prescribed to you during your pregnancy. Talk with your health care provider before taking any herbal supplements, even if you have been taking them regularly. Make sure you gain a healthy amount of weight during your pregnancy. Watch for infection. If you think that you might have an infection, get it checked right away. Make sure to tell your health care provider if you have gone into preterm labor before. This information is not intended to replace advice given to you by your health care provider. Make sure you discuss any questions you have with your health care provider. Document Revised: 08/09/2018 Document Reviewed: 09/08/2015 Elsevier Patient Education  2020 Elsevier Inc.   

## 2020-12-21 NOTE — Progress Notes (Signed)
LOW-RISK PREGNANCY VISIT Patient name: Martha Herrera MRN [id number]  Date of birth: [birth year] Chief Complaint:   Routine Prenatal Visit  History of Present Illness:   Martha Herrera is a 25 y.o. G90P1001 female at [redacted]w[redacted]d with an Estimated Date of Delivery: 01/29/21 being seen today for ongoing management of a low-risk pregnancy.   Today she reports no complaints. Contractions: Irregular. Vag. Bleeding: None.  Movement: Present. denies leaking of fluid.  Depression screen [facility] 2/9 11/04/2020 07/19/2020 06/08/2020 11/11/2019 09/17/2019  Decreased Interest 0 0 0 1 0  Down, Depressed, Hopeless 0 0 0 0 0  PHQ - 2 Score 0 0 0 1 0  Altered sleeping 3 0 3 3 3   Tired, decreased energy 1 0 3 3 3   Change in appetite 0 0 0 0 0  Feeling bad or failure about yourself  0 0 0 0 0  Trouble concentrating 0 0 0 0 0  Moving slowly or fidgety/restless 0 0 0 0 0  Suicidal thoughts 0 0 0 0 0  PHQ-9 Score 4 0 6 7 6   Difficult doing work/chores - - Not difficult at all Not difficult at all Somewhat difficult  Some recent data might be hidden     GAD 7 : Generalized Anxiety Score 11/04/2020 07/19/2020 04/30/2019 02/26/2019  Nervous, Anxious, on Edge 0 0 0 2  Control/stop worrying 0 0 0 2  Worry too much - different things 0 0 0 2  Trouble relaxing 3 0 0 3  Restless 0 0 0 1  Easily annoyed or irritable 0 0 0 3  Afraid - awful might happen 0 0 0 0  Total GAD 7 Score 3 0 0 13  Anxiety Difficulty - - Not difficult at all Very difficult      Review of Systems:   Pertinent items are noted in HPI Denies abnormal vaginal discharge w/ itching/odor/irritation, headaches, visual changes, shortness of breath, chest pain, abdominal pain, severe nausea/vomiting, or problems with urination or bowel movements unless otherwise stated above. Pertinent History Reviewed:  Reviewed past medical,surgical, social, obstetrical and family history.  Reviewed problem list, medications and allergies. Physical Assessment:   Vitals:    12/21/20 0932  BP: 129/85  Pulse: 84  Weight: 192 lb (87.1 kg)  Body mass index is 40.13 kg/m.        Physical Examination:   General appearance: Well appearing, and in no distress  Mental status: Alert, oriented to person, place, and time  Skin: Warm & dry  Cardiovascular: Normal heart rate noted  Respiratory: Normal respiratory effort, no distress  Abdomen: Soft, gravid, nontender  Pelvic: Cervical exam deferred         Extremities: Edema: None  Fetal Status: Fetal Heart Rate (bpm): 140 Fundal Height: 34 cm Movement: Present    Chaperone: N/A   No results found for this or any previous visit (from the past 24 hour(s)).  Assessment & Plan:  1) Low-risk pregnancy G2P1001 at [redacted]w[redacted]d with an Estimated Date of Delivery: 01/29/21   2) Prev c/s> for RCS w/ bTL (consent signed 8/10)  3) H/O GDM> normal GTT this pregnancy   Meds: No orders of the defined types were placed in this encounter.  Labs/procedures today: none  Plan:  Continue routine obstetrical care  Next visit: prefers will be in person for cultures     Reviewed: Preterm labor symptoms and general obstetric precautions including but not limited to vaginal bleeding, contractions, leaking of fluid and fetal movement were  reviewed in detail with the patient.  All questions were answered. Does have home bp cuff. Office bp cuff given: not applicable. Check bp weekly, let us know if consistently >140 and/or >90.  Follow-up: Return in about 2 weeks (around 01/04/2021) for LROB, in person, MD only to schedule c/s.  Future Appointments  Date Time Provider Department Center  01/04/2021 11:50 AM Lazaro Arms, MD [facility]    No orders of the defined types were placed in this encounter.  Cheral Marker CNM, [facility] 12/21/2020 9:55 AM

## 2021-01-04 ENCOUNTER — Other Ambulatory Visit: Payer: Self-pay

## 2021-01-04 ENCOUNTER — Other Ambulatory Visit (HOSPITAL_COMMUNITY)
Admission: RE | Admit: 2021-01-04 | Discharge: 2021-01-04 | Disposition: A | Discharge status: Home / Self Care | Payer: Medicaid Other | Source: Ambulatory Visit | Attending: Obstetrics & Gynecology | Admitting: Obstetrics & Gynecology

## 2021-01-04 ENCOUNTER — Encounter: Payer: Self-pay | Admitting: Obstetrics & Gynecology

## 2021-01-04 ENCOUNTER — Ambulatory Visit (INDEPENDENT_AMBULATORY_CARE_PROVIDER_SITE_OTHER): Payer: Medicaid Other | Admitting: Obstetrics & Gynecology

## 2021-01-04 VITALS — BP 129/76 | HR 90 | Wt 192.0 lb

## 2021-01-04 DIAGNOSIS — Z3480 Encounter for supervision of other normal pregnancy, unspecified trimester: Secondary | ICD-10-CM | POA: Insufficient documentation

## 2021-01-04 DIAGNOSIS — Z3A36 36 weeks gestation of pregnancy: Secondary | ICD-10-CM | POA: Diagnosis not present

## 2021-01-04 NOTE — Progress Notes (Signed)
   LOW-RISK PREGNANCY VISIT Patient name: Martha Herrera MRN [id number]  Date of birth: 13-Jun-1995 Chief Complaint:   Routine Prenatal Visit (cultures)  History of Present Illness:   Martha Herrera is a 25 y.o. G84P1001 female at [redacted]w[redacted]d with an Estimated Date of Delivery: 01/29/21 being seen today for ongoing management of a low-risk pregnancy.  Depression screen [facility] 2/9 11/04/2020 07/19/2020 06/08/2020 11/11/2019 09/17/2019  Decreased Interest 0 0 0 1 0  Down, Depressed, Hopeless 0 0 0 0 0  PHQ - 2 Score 0 0 0 1 0  Altered sleeping 3 0 3 3 3   Tired, decreased energy 1 0 3 3 3   Change in appetite 0 0 0 0 0  Feeling bad or failure about yourself  0 0 0 0 0  Trouble concentrating 0 0 0 0 0  Moving slowly or fidgety/restless 0 0 0 0 0  Suicidal thoughts 0 0 0 0 0  PHQ-9 Score 4 0 6 7 6   Difficult doing work/chores - - Not difficult at all Not difficult at all Somewhat difficult  Some recent data might be hidden    Today she reports no complaints. Contractions: Irregular. Vag. Bleeding: None.  Movement: Present. denies leaking of fluid. Review of Systems:   Pertinent items are noted in HPI Denies abnormal vaginal discharge w/ itching/odor/irritation, headaches, visual changes, shortness of breath, chest pain, abdominal pain, severe nausea/vomiting, or problems with urination or bowel movements unless otherwise stated above. Pertinent History Reviewed:  Reviewed past medical,surgical, social, obstetrical and family history.  Reviewed problem list, medications and allergies. Physical Assessment:   Vitals:   01/04/21 1204  BP: 129/76  Pulse: 90  Weight: 192 lb (87.1 kg)  Body mass index is 40.13 kg/m.        Physical Examination:   General appearance: Well appearing, and in no distress  Mental status: Alert, oriented to person, place, and time  Skin: Warm & dry  Cardiovascular: Normal heart rate noted  Respiratory: Normal respiratory effort, no distress  Abdomen: Soft, gravid,  nontender  Pelvic: Cervical exam performed         Extremities: Edema: None  Fetal Status:     Movement: Present    Chaperone: Latisha Cresenzo    No results found for this or any previous visit (from the past 24 hour(s)).  Assessment & Plan:  1) Low-risk pregnancy G2P1001 at [redacted]w[redacted]d with an Estimated Date of Delivery: 01/29/21   2) Previous C Section for repeat 01/26/21 + BTL,    Meds: No orders of the defined types were placed in this encounter.  Labs/procedures today: cultures  Plan:  Continue routine obstetrical care  Next visit: prefers in person    Reviewed: Term labor symptoms and general obstetric precautions including but not limited to vaginal bleeding, contractions, leaking of fluid and fetal movement were reviewed in detail with the patient.  All questions were answered. Has home bp cuff. Rx faxed to . Check bp weekly, let [redacted]w[redacted]d know if >140/90.   Follow-up: Return in about 1 week (around 01/11/2021) for LROB.  Orders Placed This Encounter  Procedures   Strep Gp B NAA+Rflx    01/28/21, MD 01/04/2021 12:38 PM

## 2021-01-05 LAB — CERVICOVAGINAL ANCILLARY ONLY
Chlamydia: NEGATIVE
Comment: NEGATIVE
Comment: NORMAL
Neisseria Gonorrhea: NEGATIVE

## 2021-01-06 LAB — STREP GP B NAA+RFLX: Strep Gp B NAA+Rflx: NEGATIVE

## 2021-01-11 ENCOUNTER — Ambulatory Visit (INDEPENDENT_AMBULATORY_CARE_PROVIDER_SITE_OTHER): Payer: Medicaid Other | Admitting: Women's Health

## 2021-01-11 ENCOUNTER — Other Ambulatory Visit: Payer: Self-pay

## 2021-01-11 ENCOUNTER — Encounter: Payer: Self-pay | Admitting: Women's Health

## 2021-01-11 VITALS — BP 132/86 | HR 93 | Wt 191.0 lb

## 2021-01-11 DIAGNOSIS — Z23 Encounter for immunization: Secondary | ICD-10-CM | POA: Diagnosis not present

## 2021-01-11 DIAGNOSIS — Z3A37 37 weeks gestation of pregnancy: Secondary | ICD-10-CM

## 2021-01-11 DIAGNOSIS — Z3480 Encounter for supervision of other normal pregnancy, unspecified trimester: Secondary | ICD-10-CM

## 2021-01-11 DIAGNOSIS — Z3483 Encounter for supervision of other normal pregnancy, third trimester: Secondary | ICD-10-CM

## 2021-01-11 NOTE — Patient Instructions (Signed)
Martha Herrera, thank you for choosing our office today! We appreciate the opportunity to meet your healthcare needs. You may receive a short survey by mail, e-mail, or through [employer]. If you are happy with your care we would appreciate if you could take just a few minutes to complete the survey questions. We read all of your comments and take your feedback very seriously. Thank you again for choosing our office.  Center for [employer] Team at [facility]  [facility] at [facility] ([address] [place], [place] [address]) Entrance C, located off of [address] Free 24/7 valet parking   CLASSES: Go to [email or web address] to register for classes (childbirth, breastfeeding, waterbirth, infant CPR, daddy bootcamp, etc.)  Call the office [phone]) or go to [facility] if: You begin to have strong, frequent contractions Your water breaks.  Sometimes it is a big gush of fluid, sometimes it is just a trickle that keeps getting your panties wet or running down your legs You have vaginal bleeding.  It is normal to have a small amount of spotting if your cervix was checked.  You don't feel your baby moving like normal.  If you don't, get you something to eat and drink and lay down and focus on feeling your baby move.   If your baby is still not moving like normal, you should call the office or go to [facility].  Call the office [phone]) or go to [facility] hospital for these signs of pre-eclampsia: Severe headache that does not go away with Tylenol Visual changes- seeing spots, double, blurred vision Pain under your right breast or upper abdomen that does not go away with Tums or heartburn medicine Nausea and/or vomiting Severe swelling in your hands, feet, and face   [facility] Pediatricians/Family Doctors [facility]): [address] Colette Ribas, [phone]           [facility]: [address], [phone]                 [facility]): [address], [phone] (call to ask if accepting patients) [facility] Department: [address], [place], [phone]    [facility] Pediatricians/Family Doctors [facility]): [address], [phone] [facility]: [address] [place], [phone] [facility]: [address], [phone]  [facility] Doctors  [facility]): [phone] [facility]: [address] [phone]   [facility] Doctors [facility]: [address] [phone]  [facility] Doctors  [facility]: [phone], [phone]  Home Blood Pressure Monitoring for Patients   Your provider has recommended that you check your blood pressure (BP) at least once a week at home. If you do not have a blood pressure cuff at home, one will be provided for you. Contact your provider if you have not received your monitor within 1 week.   Helpful Tips for Accurate Home Blood Pressure Checks  Don't smoke, exercise, or drink caffeine 30 minutes before checking your BP Use the restroom before checking your BP (a full bladder can raise your pressure) Relax in a comfortable upright chair Feet on the ground Left arm resting comfortably on a flat surface at the level of your heart Legs uncrossed Back supported Sit quietly and don't talk Place the cuff on your bare arm Adjust snuggly, so that only two fingertips  can fit between your skin and the top of the cuff Check 2 readings separated by at least one minute Keep a log of your BP readings For a visual, please reference this diagram: http://ccnc.care/bpdiagram  Provider Name: [facility]     Phone: [phone]  Zone 1: ALL CLEAR  Continue to monitor your symptoms:  BP reading is less than 140 (top number) or less than 90 (bottom number)  No right  upper stomach pain No headaches or seeing spots No feeling nauseated or throwing up No swelling in face and hands  Zone 2: CAUTION Call your doctor's office for any of the following:  BP reading is greater than 140 (top number) or greater than 90 (bottom number)  Stomach pain under your ribs in the middle or right side Headaches or seeing spots Feeling nauseated or throwing up Swelling in face and hands  Zone 3: EMERGENCY  Seek immediate medical care if you have any of the following:  BP reading is greater than160 (top number) or greater than 110 (bottom number) Severe headaches not improving with Tylenol Serious difficulty catching your breath Any worsening symptoms from Zone 2   Braxton Hicks Contractions Contractions of the uterus can occur throughout pregnancy, but they are not always a sign that you are in labor. You may have practice contractions called Braxton Hicks contractions. These false labor contractions are sometimes confused with true labor. What are [place] contractions? Braxton Hicks contractions are tightening movements that occur in the muscles of the uterus before labor. Unlike true labor contractions, these contractions do not result in opening (dilation) and thinning of the cervix. Toward the end of pregnancy (32-34 weeks), Braxton Hicks contractions can happen more often and may become stronger. These contractions are sometimes difficult to tell apart from true labor because they can be very uncomfortable. You should not feel embarrassed if you go to the hospital with false labor. Sometimes, the only way to tell if you are in true labor is for your health care provider to look for changes in the cervix. The health care provider will do a physical exam and may monitor your contractions. If you are not in true labor, the exam should show that your cervix is not dilating and your water has not broken. If there are no other health problems associated with your  pregnancy, it is completely safe for you to be sent home with false labor. You may continue to have Braxton Hicks contractions until you go into true labor. How to tell the difference between true labor and false labor True labor Contractions last 30-70 seconds. Contractions become very regular. Discomfort is usually felt in the top of the uterus, and it spreads to the lower abdomen and low back. Contractions do not go away with walking. Contractions usually become more intense and increase in frequency. The cervix dilates and gets thinner. False labor Contractions are usually shorter and not as strong as true labor contractions. Contractions are usually irregular. Contractions are often felt in the front of the lower abdomen and in the groin. Contractions may go away when you walk around or change positions while lying down. Contractions get weaker and are shorter-lasting as time goes on. The cervix usually does not dilate or become thin. Follow these instructions at home:  Take over-the-counter and prescription medicines only as told by your health care provider. Keep up with your usual exercises and follow other instructions from your health care provider. Eat and drink lightly if you think  you are going into labor. If Braxton Hicks contractions are making you uncomfortable: Change your position from lying down or resting to walking, or change from walking to resting. Sit and rest in a tub of warm water. Drink enough fluid to keep your urine pale yellow. Dehydration may cause these contractions. Do slow and deep breathing several times an hour. Keep all follow-up prenatal visits as told by your health care provider. This is important. Contact a health care provider if: You have a fever. You have continuous pain in your abdomen. Get help right away if: Your contractions become stronger, more regular, and closer together. You have fluid leaking or gushing from your vagina. You pass  blood-tinged mucus (bloody show). You have bleeding from your vagina. You have low back pain that you never had before. You feel your baby's head pushing down and causing pelvic pressure. Your baby is not moving inside you as much as it used to. Summary Contractions that occur before labor are called Braxton Hicks contractions, false labor, or practice contractions. Braxton Hicks contractions are usually shorter, weaker, farther apart, and less regular than true labor contractions. True labor contractions usually become progressively stronger and regular, and they become more frequent. Manage discomfort from [facility] contractions by changing position, resting in a warm bath, drinking plenty of water, or practicing deep breathing. This information is not intended to replace advice given to you by your health care provider. Make sure you discuss any questions you have with your health care provider. Document Revised: 03/30/2017 Document Reviewed: 08/31/2016 Elsevier Patient Education  [place].

## 2021-01-11 NOTE — Progress Notes (Signed)
LOW-RISK PREGNANCY VISIT Patient name: Martha Herrera MRN [id number]  Date of birth: Dec 14, 1995 Chief Complaint:   Routine Prenatal Visit  History of Present Illness:   Martha Herrera is a 25 y.o. G21P1001 female at [redacted]w[redacted]d with an Estimated Date of Delivery: 01/29/21 being seen today for ongoing management of a low-risk pregnancy.   Today she reports  bug bites on legs . Contractions: Irritability. Vag. Bleeding: None.  Movement: Present. denies leaking of fluid.  Depression screen [facility] 2/9 11/04/2020 07/19/2020 06/08/2020 11/11/2019 09/17/2019  Decreased Interest 0 0 0 1 0  Down, Depressed, Hopeless 0 0 0 0 0  PHQ - 2 Score 0 0 0 1 0  Altered sleeping 3 0 3 3 3   Tired, decreased energy 1 0 3 3 3   Change in appetite 0 0 0 0 0  Feeling bad or failure about yourself  0 0 0 0 0  Trouble concentrating 0 0 0 0 0  Moving slowly or fidgety/restless 0 0 0 0 0  Suicidal thoughts 0 0 0 0 0  PHQ-9 Score 4 0 6 7 6   Difficult doing work/chores - - Not difficult at all Not difficult at all Somewhat difficult  Some recent data might be hidden     GAD 7 : Generalized Anxiety Score 11/04/2020 07/19/2020 04/30/2019 02/26/2019  Nervous, Anxious, on Edge 0 0 0 2  Control/stop worrying 0 0 0 2  Worry too much - different things 0 0 0 2  Trouble relaxing 3 0 0 3  Restless 0 0 0 1  Easily annoyed or irritable 0 0 0 3  Afraid - awful might happen 0 0 0 0  Total GAD 7 Score 3 0 0 13  Anxiety Difficulty - - Not difficult at all Very difficult      Review of Systems:   Pertinent items are noted in HPI Denies abnormal vaginal discharge w/ itching/odor/irritation, headaches, visual changes, shortness of breath, chest pain, abdominal pain, severe nausea/vomiting, or problems with urination or bowel movements unless otherwise stated above. Pertinent History Reviewed:  Reviewed past medical,surgical, social, obstetrical and family history.  Reviewed problem list, medications and allergies. Physical Assessment:    Vitals:   01/11/21 1402  BP: 132/86  Pulse: 93  Weight: 191 lb (86.6 kg)  Body mass index is 39.92 kg/m.        Physical Examination:   General appearance: Well appearing, and in no distress  Mental status: Alert, oriented to person, place, and time  Skin: Warm & dry  Cardiovascular: Normal heart rate noted  Respiratory: Normal respiratory effort, no distress  Abdomen: Soft, gravid, nontender  Pelvic: Cervical exam deferred         Extremities: Edema: None  Fetal Status: Fetal Heart Rate (bpm): 152 Fundal Height: 37 cm Movement: Present    Chaperone: N/A   No results found for this or any previous visit (from the past 24 hour(s)).  Assessment & Plan:  1) Low-risk pregnancy G2P1001 at [redacted]w[redacted]d with an Estimated Date of Delivery: 01/29/21   2) Bug bites, try bug sprays or citronella oil. Benadryl cream, itch cream prn  3) Prev c/s> for RCS w/ BTL 9/28   Meds: No orders of the defined types were placed in this encounter.  Labs/procedures today: flu shot  Plan:  Continue routine obstetrical care  Next visit: prefers in person    Reviewed: Term labor symptoms and general obstetric precautions including but not limited to vaginal bleeding, contractions, leaking of  fluid and fetal movement were reviewed in detail with the patient.  All questions were answered. Does have home bp cuff. Office bp cuff given: not applicable. Check bp weekly, let us know if consistently >140 and/or >90.  Follow-up: Return in about 1 week (around 01/18/2021) for LROB, CNM, in person.  No future appointments.   Orders Placed This Encounter  Procedures   Flu Vaccine QUAD 72mo+IM (Fluarix, Fluzone & Alfiuria Quad PF)   Cheral Marker CNM, [facility] 01/11/2021 2:18 PM

## 2021-01-12 ENCOUNTER — Telehealth: Payer: Self-pay

## 2021-01-12 ENCOUNTER — Encounter (HOSPITAL_COMMUNITY): Payer: Self-pay

## 2021-01-12 NOTE — Telephone Encounter (Signed)
After reading Mychart msg, called pt. Two identifiers used. Pt stated that she saw Martha Herrera yesterday, but did not have her cervix checked. She was unsure how to time her contractions and pt was instructed on how to do that. She was instructed to keep monitoring them, use measures to help with pain, and call us if they are regular and closer together to be checked or sent to MAU. Pt confirmed understanding.

## 2021-01-12 NOTE — Telephone Encounter (Signed)
Pt called back after monitoring her contractions. She is having irregular contractions every 7-10 minutes. She denies any fluid leakage, bleeding, or spotting. Baby movement is still normal. Pt encouraged to continue monitoring and go to MAU if contractions are every 5 minutes apart or less, or if she has any fluid leakage or bleeding. Pt will call back if any significant changes before the office closes today. Pt confirmed understanding.

## 2021-01-12 NOTE — Patient Instructions (Signed)
FRADY TADDEO  01/12/2021   Your procedure is scheduled on:  01/26/2021  Arrive at 0530 at Entrance C on [employer] at [facility]  and [employer]. You are invited to use the FREE valet parking or use the Visitor's parking deck.  Pick up the phone at the desk and dial [phone].  Call this number if you have problems the morning of surgery: [phone]  Remember:   Do not eat food:(After Midnight) Desps de medianoche.  Do not drink clear liquids: (After Midnight) Desps de medianoche.  Take these medicines the morning of surgery with A SIP OF WATER:  none   Do not wear jewelry, make-up or nail polish.  Do not wear lotions, powders, or perfumes. Do not wear deodorant.  Do not shave 48 hours prior to surgery.  Do not bring valuables to the hospital.  [facility] is not   responsible for any belongings or valuables brought to the hospital.  Contacts, dentures or bridgework may not be worn into surgery.  Leave suitcase in the car. After surgery it may be brought to your room.  For patients admitted to the hospital, checkout time is 11:00 AM the day of              discharge.      Please read over the following fact sheets that you were given:     Preparing for Surgery

## 2021-01-13 ENCOUNTER — Encounter (HOSPITAL_COMMUNITY): Payer: Self-pay | Admitting: Obstetrics & Gynecology

## 2021-01-13 ENCOUNTER — Other Ambulatory Visit: Payer: Self-pay

## 2021-01-13 ENCOUNTER — Inpatient Hospital Stay (HOSPITAL_COMMUNITY)
Admission: AD | Admit: 2021-01-13 | Discharge: 2021-01-13 | Disposition: A | Discharge status: Home / Self Care | Payer: Medicaid Other | Attending: Obstetrics & Gynecology | Admitting: Obstetrics & Gynecology

## 2021-01-13 DIAGNOSIS — O479 False labor, unspecified: Secondary | ICD-10-CM

## 2021-01-13 DIAGNOSIS — Z3A37 37 weeks gestation of pregnancy: Secondary | ICD-10-CM

## 2021-01-13 DIAGNOSIS — O368130 Decreased fetal movements, third trimester, not applicable or unspecified: Secondary | ICD-10-CM

## 2021-01-13 DIAGNOSIS — Z880 Allergy status to penicillin: Secondary | ICD-10-CM | POA: Insufficient documentation

## 2021-01-13 DIAGNOSIS — O471 False labor at or after 37 completed weeks of gestation: Secondary | ICD-10-CM | POA: Diagnosis not present

## 2021-01-13 DIAGNOSIS — Z3480 Encounter for supervision of other normal pregnancy, unspecified trimester: Secondary | ICD-10-CM

## 2021-01-13 NOTE — MAU Provider Note (Signed)
History     [id number]  Arrival date and time: 01/13/21 8938    Chief Complaint  Patient presents with   Contractions     HPI Martha Herrera is a 25 y.o. at [redacted]w[redacted]d with PMHx notable for prior CS, who presents for decreased fetal movement.   Patient reports contractions since early this morning No vaginal bleeding or loss of fluid Had decreased fetal movement before arriving but has now improved  O/Positive/-- (03/21 1108)  OB History     Gravida  2   Para  1   Term  1   Preterm  0   AB  0   Living  1      SAB  0   IAB  0   Ectopic  0   Multiple  0   Live Births  1           Past Medical History:  Diagnosis Date   Complication of anesthesia    hard to wake up high level with CS   Gastroesophageal reflux    takes prescribed meds daily   Gestational diabetes    Headache(784.0)    not as often   Heart murmur    Pneumonia 2013   Varicella     Past Surgical History:  Procedure Laterality Date   CESAREAN SECTION N/A 08/07/2018   Procedure: CESAREAN SECTION;  Surgeon: Lazaro Arms, MD;  Location: [facility] ORS;  Service: Obstetrics;  Laterality: N/A;   ESOPHAGOGASTRODUODENOSCOPY  05/03/2012   Procedure: ESOPHAGOGASTRODUODENOSCOPY (EGD);  Surgeon: Jon Gills, MD;  Location: [facility] OR;  Service: Gastroenterology;  Laterality: N/A;   tubes in ears     WISDOM TOOTH EXTRACTION      Family History  Problem Relation Age of Onset   GER disease Father    Cancer Father        kidney   Asthma Brother    Miscarriages / [place] Brother    GER disease Maternal Grandmother    Hyperlipidemia Maternal Grandmother    ADD / ADHD Brother    Miscarriages / Stillbirths Mother    Headache Mother    Cancer Maternal Grandfather    Diabetes Maternal Grandfather    Hyperlipidemia Maternal Grandfather    Vision loss Maternal Grandfather    GER disease Maternal Aunt    Migraines Maternal Aunt    Anxiety disorder Maternal Aunt    Diabetes Paternal Aunt     Cancer Paternal Uncle        spinal cancer   Hernia Son    Ataxia Neg Hx    Chorea Neg Hx    Dementia Neg Hx    Mental retardation Neg Hx    Multiple sclerosis Neg Hx    Neurofibromatosis Neg Hx    Neuropathy Neg Hx    Parkinsonism Neg Hx    Seizures Neg Hx    Stroke Neg Hx    Depression Neg Hx    Bipolar disorder Neg Hx     Social History   Socioeconomic History   Marital status: Married    Spouse name: Jaylinn Hellenbrand   Number of children: 1   Years of education: Not on file   Highest education level: Associate degree: academic program  Occupational History   Not on file  Tobacco Use   Smoking status: Never   Smokeless tobacco: Never  Vaping Use   Vaping Use: Never used  Substance and Sexual Activity   Alcohol use: No   Drug use:  No   Sexual activity: Yes    Birth control/protection: None  Other Topics Concern   Not on file  Social History Narrative   10th grade   Social Determinants of Health   Financial Resource Strain: Low Risk    Difficulty of Paying Living Expenses: Not hard at all  Food Insecurity: No Food Insecurity   Worried About Programme researcher, broadcasting/film/video in the Last Year: Never true   Ran Out of Food in the Last Year: Never true  Transportation Needs: No Transportation Needs   Lack of Transportation (Medical): No   Lack of Transportation (Non-Medical): No  Physical Activity: Unknown   Days of Exercise per Week: Patient refused   Minutes of Exercise per Session: Patient refused  Stress: No Stress Concern Present   Feeling of Stress : Not at all  Social Connections: Moderately Isolated   Frequency of Communication with Friends and Family: More than three times a week   Frequency of Social Gatherings with Friends and Family: More than three times a week   Attends Religious Services: Never   Database administrator or Organizations: No   Attends Engineer, structural: Never   Marital Status: Married  Catering manager Violence: Not At Risk   Fear of  Current or Ex-Partner: No   Emotionally Abused: No   Physically Abused: No   Sexually Abused: No    Allergies  Allergen Reactions   Penicillins Hives, Itching and Rash    Did it involve swelling of the face/tongue/throat, SOB, or low BP? No Did it involve sudden or severe rash/hives, skin peeling, or any reaction on the inside of your mouth or nose? No Did you need to seek medical attention at a hospital or doctor's office? No When did it last happen?     childhood allergy  If all above answers are "NO", may proceed with cephalosporin use.    Amoxil [Amoxicillin] Itching and Rash    Did it involve swelling of the face/tongue/throat, SOB, or low BP? No Did it involve sudden or severe rash/hives, skin peeling, or any reaction on the inside of your mouth or nose? No Did you need to seek medical attention at a hospital or doctor's office? No When did it last happen?     2019 If all above answers are "NO", may proceed with cephalosporin use.    No current facility-administered medications on file prior to encounter.   Current Outpatient Medications on File Prior to Encounter  Medication Sig Dispense Refill   loratadine (CLARITIN) 10 MG tablet Take 10 mg by mouth daily.     Prenatal Vit-Fe Fumarate-FA (PRENATAL VITAMIN PO) Take by mouth.     acetaminophen (TYLENOL) 500 MG tablet Take 1,000 mg by mouth every 6 (six) hours as needed.     Blood Pressure Monitor MISC For regular home bp monitoring during pregnancy 1 each 0   Doxylamine-Pyridoxine (DICLEGIS) 10-10 MG TBEC Take 2 qhs; may also take one in am and one in afternoon prn nausea (Patient not taking: No sig reported) 120 tablet 6   omeprazole (PRILOSEC) 40 MG capsule Take 1 capsule (40 mg total) by mouth daily. 30 capsule 6   ondansetron (ZOFRAN ODT) 4 MG disintegrating tablet Take 1 tablet (4 mg total) by mouth every 8 (eight) hours as needed for nausea or vomiting. 30 tablet 3     ROS Pertinent positives and negative per HPI,  all others reviewed and negative  Physical Exam   BP 127/81 (BP  Location: Right Arm)   Pulse 80   Temp 97.7 F (36.5 C) (Oral)   Resp 15   Ht 4\' 10"  (1.473 m)   Wt 86.7 kg   LMP 04/24/2020   SpO2 99%   BMI 39.96 kg/m   Patient Vitals for the past 24 hrs:  BP Temp Temp src Pulse Resp SpO2 Height Weight  01/13/21 0839 127/81 -- -- 80 15 99 % -- --  01/13/21 0723 123/86 -- -- (!) 101 -- -- -- --  01/13/21 0713 125/84 97.7 F (36.5 C) Oral (!) 113 20 100 % -- --  01/13/21 0708 -- -- -- -- -- -- 4\' 10"  (1.473 m) 86.7 kg    Physical Exam Vitals reviewed.  Constitutional:      General: She is not in acute distress.    Appearance: She is well-developed. She is not diaphoretic.  Eyes:     General: No scleral icterus. Pulmonary:     Effort: Pulmonary effort is normal. No respiratory distress.  Skin:    General: Skin is warm and dry.  Neurological:     Mental Status: She is alert.     Coordination: Coordination normal.     Cervical Exam Dilation: 1 Effacement (%): Thick Station: -3 Presentation: Vertex Exam by:: Flippin RN  Bedside Ultrasound Not done  My interpretation: n/a  FHT Baseline 135, moderate variability, +accels, no decels Toco: rare ctx Cat: I  Labs No results found for this or any previous visit (from the past 24 hour(s)).  Imaging No results found.  MAU Course  Procedures Lab Orders  No laboratory test(s) ordered today   No orders of the defined types were placed in this encounter.  Imaging Orders  No imaging studies ordered today    MDM mild  Assessment and Plan  #DFM Resolved since arrival to the MAU, NST reactive.   #False labor Cervix still thick, rare contractions. Does not appear laborous.  #FWB FHT Cat I NST: Reactive  Discharged to home in stable condition.  [phone], MD/MPH 01/13/21 8:49 AM  Allergies as of 01/13/2021       Reactions   Penicillins Hives, Itching, Rash   Did it involve swelling  of the face/tongue/throat, SOB, or low BP? No Did it involve sudden or severe rash/hives, skin peeling, or any reaction on the inside of your mouth or nose? No Did you need to seek medical attention at a hospital or doctor's office? No When did it last happen?     childhood allergy  If all above answers are "NO", may proceed with cephalosporin use.   Amoxil [amoxicillin] Itching, Rash   Did it involve swelling of the face/tongue/throat, SOB, or low BP? No Did it involve sudden or severe rash/hives, skin peeling, or any reaction on the inside of your mouth or nose? No Did you need to seek medical attention at a hospital or doctor's office? No When did it last happen?     2019 If all above answers are "NO", may proceed with cephalosporin use.        Medication List     TAKE these medications    acetaminophen 500 MG tablet Commonly known as: TYLENOL Take 1,000 mg by mouth every 6 (six) hours as needed.   Blood Pressure Monitor Misc For regular home bp monitoring during pregnancy   Doxylamine-Pyridoxine 10-10 MG Tbec Commonly known as: Diclegis Take 2 qhs; may also take one in am and one in afternoon prn nausea  loratadine 10 MG tablet Commonly known as: CLARITIN Take 10 mg by mouth daily.   omeprazole 40 MG capsule Commonly known as: PRILOSEC Take 1 capsule (40 mg total) by mouth daily.   ondansetron 4 MG disintegrating tablet Commonly known as: Zofran ODT Take 1 tablet (4 mg total) by mouth every 8 (eight) hours as needed for nausea or vomiting.   PRENATAL VITAMIN PO Take by mouth.

## 2021-01-13 NOTE — MAU Note (Signed)
Presents stating she's having ctxs 5 minutes apart since 0400.  Denies VB or LOF.  Endorses +FM, not much as usual.

## 2021-01-19 ENCOUNTER — Ambulatory Visit (INDEPENDENT_AMBULATORY_CARE_PROVIDER_SITE_OTHER): Payer: Medicaid Other | Admitting: Advanced Practice Midwife

## 2021-01-19 ENCOUNTER — Other Ambulatory Visit: Payer: Self-pay

## 2021-01-19 VITALS — BP 128/89 | HR 103 | Wt 192.8 lb

## 2021-01-19 DIAGNOSIS — O169 Unspecified maternal hypertension, unspecified trimester: Secondary | ICD-10-CM

## 2021-01-19 DIAGNOSIS — Z3480 Encounter for supervision of other normal pregnancy, unspecified trimester: Secondary | ICD-10-CM

## 2021-01-19 DIAGNOSIS — Z3A38 38 weeks gestation of pregnancy: Secondary | ICD-10-CM

## 2021-01-19 NOTE — Progress Notes (Signed)
   LOW-RISK PREGNANCY VISIT Patient name: Martha Herrera MRN [id number]  Date of birth: 06-01-1995 Chief Complaint:   Routine Prenatal Visit  History of Present Illness:   Martha Herrera is a 25 y.o. G42P1001 female at [redacted]w[redacted]d with an Estimated Date of Delivery: 01/29/21 being seen today for ongoing management of a low-risk pregnancy.  Today she reports no complaints. Contractions: Irritability. Vag. Bleeding: None.  Movement: Present. denies leaking of fluid. Review of Systems:   Pertinent items are noted in HPI Denies abnormal vaginal discharge w/ itching/odor/irritation, headaches, visual changes, shortness of breath, chest pain, abdominal pain, severe nausea/vomiting, or problems with urination or bowel movements unless otherwise stated above. Pertinent History Reviewed:  Reviewed past medical,surgical, social, obstetrical and family history.  Reviewed problem list, medications and allergies. Physical Assessment:   Vitals:   01/19/21 1413 01/19/21 1416  BP: (!) 143/90 128/89  Pulse: 92 (!) 103  Weight: 192 lb 12.8 oz (87.5 kg)   Body mass index is 40.3 kg/m.        Physical Examination:   General appearance: Well appearing, and in no distress  Mental status: Alert, oriented to person, place, and time  Skin: Warm & dry  Cardiovascular: Normal heart rate noted  Respiratory: Normal respiratory effort, no distress  Abdomen: Soft, gravid, nontender  Pelvic: Cervical exam performed  Dilation: 1 Effacement (%): Thick Station: -2  Extremities: Edema: None  Fetal Status: Fetal Heart Rate (bpm): 144 Fundal Height: 38 cm Movement: Present Presentation: Vertex  No results found for this or any previous visit (from the past 24 hour(s)).  Assessment & Plan:  1) Low-risk pregnancy G2P1001 at [redacted]w[redacted]d with an Estimated Date of Delivery: 01/29/21   2) Elevated BP, asymptomatic; first ^ value, second normal; will collect pre-e labs and have an RN BP check first thing in the morning (will be NPO); if  elevated, will go to [facility] for rLTCS/BTL; if normal and labs normal, will have appt 9/26  3) Previous C/S, repeat 9/28 with BTL   Meds: No orders of the defined types were placed in this encounter.  Labs/procedures today: pre-e labs; SVE  Plan:  Continue routine obstetrical care   Reviewed: Term labor symptoms and general obstetric precautions including but not limited to vaginal bleeding, contractions, leaking of fluid and fetal movement were reviewed in detail with the patient.  All questions were answered. Has home bp cuff. Check bp weekly, let us know if >140/90.   Follow-up: Return for RN BP check tomorrow morning early; LROB on 9/26 .  Orders Placed This Encounter  Procedures   Protein / creatinine ratio, urine   Comp Met (CMET)   CBC   Myrtis Ser [facility] 01/19/2021 2:35 PM

## 2021-01-20 ENCOUNTER — Inpatient Hospital Stay (HOSPITAL_COMMUNITY): Payer: Medicaid Other | Admitting: Anesthesiology

## 2021-01-20 ENCOUNTER — Encounter (HOSPITAL_COMMUNITY)
Admission: AD | Disposition: A | Discharge status: Home / Self Care | Payer: Self-pay | Source: Home / Self Care | Attending: Obstetrics and Gynecology

## 2021-01-20 ENCOUNTER — Other Ambulatory Visit: Payer: Self-pay | Admitting: Obstetrics and Gynecology

## 2021-01-20 ENCOUNTER — Encounter (HOSPITAL_COMMUNITY): Payer: Self-pay | Admitting: Obstetrics and Gynecology

## 2021-01-20 ENCOUNTER — Inpatient Hospital Stay (HOSPITAL_COMMUNITY)
Admission: AD | Admit: 2021-01-20 | Discharge: 2021-01-22 | DRG: 785 | Disposition: A | Discharge status: Home / Self Care | Payer: Medicaid Other | Attending: Obstetrics and Gynecology | Admitting: Obstetrics and Gynecology

## 2021-01-20 ENCOUNTER — Other Ambulatory Visit: Payer: Self-pay

## 2021-01-20 ENCOUNTER — Encounter: Payer: Self-pay | Admitting: Family Medicine

## 2021-01-20 ENCOUNTER — Ambulatory Visit (INDEPENDENT_AMBULATORY_CARE_PROVIDER_SITE_OTHER): Payer: Medicaid Other

## 2021-01-20 VITALS — BP 130/94 | HR 109

## 2021-01-20 DIAGNOSIS — Z0130 Encounter for examination of blood pressure without abnormal findings: Secondary | ICD-10-CM

## 2021-01-20 DIAGNOSIS — O34211 Maternal care for low transverse scar from previous cesarean delivery: Principal | ICD-10-CM | POA: Diagnosis present

## 2021-01-20 DIAGNOSIS — K219 Gastro-esophageal reflux disease without esophagitis: Secondary | ICD-10-CM | POA: Diagnosis present

## 2021-01-20 DIAGNOSIS — Z880 Allergy status to penicillin: Secondary | ICD-10-CM

## 2021-01-20 DIAGNOSIS — Z20822 Contact with and (suspected) exposure to covid-19: Secondary | ICD-10-CM | POA: Diagnosis not present

## 2021-01-20 DIAGNOSIS — Z98891 History of uterine scar from previous surgery: Secondary | ICD-10-CM

## 2021-01-20 DIAGNOSIS — O9962 Diseases of the digestive system complicating childbirth: Secondary | ICD-10-CM | POA: Diagnosis not present

## 2021-01-20 DIAGNOSIS — O134 Gestational [pregnancy-induced] hypertension without significant proteinuria, complicating childbirth: Secondary | ICD-10-CM | POA: Diagnosis not present

## 2021-01-20 DIAGNOSIS — Z302 Encounter for sterilization: Secondary | ICD-10-CM

## 2021-01-20 DIAGNOSIS — O139 Gestational [pregnancy-induced] hypertension without significant proteinuria, unspecified trimester: Secondary | ICD-10-CM | POA: Diagnosis present

## 2021-01-20 DIAGNOSIS — Z3490 Encounter for supervision of normal pregnancy, unspecified, unspecified trimester: Secondary | ICD-10-CM

## 2021-01-20 DIAGNOSIS — Z3A38 38 weeks gestation of pregnancy: Secondary | ICD-10-CM

## 2021-01-20 DIAGNOSIS — Z9851 Tubal ligation status: Secondary | ICD-10-CM

## 2021-01-20 LAB — COMPREHENSIVE METABOLIC PANEL
ALT: 12 IU/L (ref 0–32)
ALT: 16 U/L (ref 0–44)
AST: 13 IU/L (ref 0–40)
AST: 20 U/L (ref 15–41)
Albumin/Globulin Ratio: 1.2 (ref 1.2–2.2)
Albumin: 3.5 g/dL — ABNORMAL LOW (ref 3.9–5.0)
Albumin: 2.8 g/dL — ABNORMAL LOW (ref 3.5–5.0)
Alkaline Phosphatase: 197 IU/L — ABNORMAL HIGH (ref 44–121)
Alkaline Phosphatase: 181 U/L — ABNORMAL HIGH (ref 38–126)
Anion gap: 9 (ref 5–15)
BUN/Creatinine Ratio: 13 (ref 9–23)
BUN: 10 mg/dL (ref 6–20)
BUN: 8 mg/dL (ref 6–20)
Bilirubin Total: 0.3 mg/dL (ref 0.0–1.2)
CO2: 19 mmol/L — ABNORMAL LOW (ref 20–29)
CO2: 23 mmol/L (ref 22–32)
Calcium: 8.9 mg/dL (ref 8.7–10.2)
Calcium: 8.8 mg/dL — ABNORMAL LOW (ref 8.9–10.3)
Chloride: 100 mmol/L (ref 96–106)
Chloride: 103 mmol/L (ref 98–111)
Creatinine, Ser: 0.78 mg/dL (ref 0.57–1.00)
Creatinine, Ser: 0.61 mg/dL (ref 0.44–1.00)
GFR, Estimated: >60 mL/min (ref >60)
Globulin, Total: 2.9 g/dL (ref 1.5–4.5)
Glucose, Bld: 77 mg/dL (ref 70–99)
Glucose: 108 mg/dL — ABNORMAL HIGH (ref 65–99)
Potassium: 3.9 mmol/L (ref 3.5–5.2)
Potassium: 3.8 mmol/L (ref 3.5–5.1)
Sodium: 135 mmol/L (ref 134–144)
Sodium: 135 mmol/L (ref 135–145)
Total Bilirubin: 0.4 mg/dL (ref 0.3–1.2)
Total Protein: 6.4 g/dL (ref 6.0–8.5)
Total Protein: 6.4 g/dL — ABNORMAL LOW (ref 6.5–8.1)
eGFR: 108 mL/min/{1.73_m2} (ref >59)

## 2021-01-20 LAB — PROTEIN / CREATININE RATIO, URINE
Creatinine, Urine: 104.1 mg/dL
Creatinine, Urine: 79.43 mg/dL
Protein Creatinine Ratio: 0.09 mg/mg{Cre} (ref 0.00–0.15)
Protein, Ur: 16.8 mg/dL
Protein/Creat Ratio: 161 mg/g creat (ref 0–200)
Total Protein, Urine: 7 mg/dL

## 2021-01-20 LAB — CBC
HCT: 36 % (ref 36.0–46.0)
Hematocrit: 35 % (ref 34.0–46.6)
Hemoglobin: 11.4 g/dL (ref 11.1–15.9)
Hemoglobin: 11.4 g/dL — ABNORMAL LOW (ref 12.0–15.0)
MCH: 24.9 pg — ABNORMAL LOW (ref 26.6–33.0)
MCH: 24.6 pg — ABNORMAL LOW (ref 26.0–34.0)
MCHC: 32.6 g/dL (ref 31.5–35.7)
MCHC: 31.7 g/dL (ref 30.0–36.0)
MCV: 76 fL — ABNORMAL LOW (ref 79–97)
MCV: 77.6 fL — ABNORMAL LOW (ref 80.0–100.0)
Platelets: 282 10*3/uL (ref 150–450)
Platelets: 272 10*3/uL (ref 150–400)
RBC: 4.58 x10E6/uL (ref 3.77–5.28)
RBC: 4.64 MIL/uL (ref 3.87–5.11)
RDW: 14.4 % (ref 11.7–15.4)
RDW: 14.3 % (ref 11.5–15.5)
WBC: 9.3 10*3/uL (ref 3.4–10.8)
WBC: 9.3 10*3/uL (ref 4.0–10.5)
nRBC: 0 % (ref 0.0–0.2)

## 2021-01-20 LAB — TYPE AND SCREEN
ABO/RH(D): O POS
Antibody Screen: NEGATIVE

## 2021-01-20 LAB — RESP PANEL BY RT-PCR (FLU A&B, COVID) ARPGX2
Influenza A by PCR: NEGATIVE
Influenza B by PCR: NEGATIVE
SARS Coronavirus 2 by RT PCR: NEGATIVE

## 2021-01-20 LAB — POCT URINALYSIS DIPSTICK OB
Blood, UA: NEGATIVE
Glucose, UA: NEGATIVE
Ketones, UA: NEGATIVE
Leukocytes, UA: NEGATIVE
Nitrite, UA: NEGATIVE
POC,PROTEIN,UA: NEGATIVE

## 2021-01-20 SURGERY — Surgical Case
Anesthesia: Spinal

## 2021-01-20 MED ORDER — NALOXONE HCL 0.4 MG/ML IJ SOLN: 0.4000 mg | INTRAMUSCULAR | Status: DC | PRN

## 2021-01-20 MED ORDER — MEPERIDINE HCL 25 MG/ML IJ SOLN: 6.2500 mg | INTRAMUSCULAR | Status: DC | PRN

## 2021-01-20 MED ORDER — ENOXAPARIN SODIUM 40 MG/0.4ML IJ SOSY
40.0000 mg | PREFILLED_SYRINGE | INTRAMUSCULAR | Status: DC
  Administered 2021-01-21 – 2021-01-22 (×2): 40 mg via SUBCUTANEOUS
  Filled 2021-01-20 (×2): qty 0.4

## 2021-01-20 MED ORDER — SCOPOLAMINE 1 MG/3DAYS TD PT72
MEDICATED_PATCH | TRANSDERMAL | Status: AC
  Filled 2021-01-20: qty 1

## 2021-01-20 MED ORDER — PHENYLEPHRINE 40 MCG/ML (10ML) SYRINGE FOR IV PUSH (FOR BLOOD PRESSURE SUPPORT)
PREFILLED_SYRINGE | INTRAVENOUS | Status: AC
  Filled 2021-01-20: qty 10

## 2021-01-20 MED ORDER — ACETAMINOPHEN 325 MG PO TABS: 325.0000 mg | ORAL_TABLET | ORAL | Status: DC | PRN

## 2021-01-20 MED ORDER — STERILE WATER FOR IRRIGATION IR SOLN
Status: DC | PRN
  Administered 2021-01-20: 1000 mL

## 2021-01-20 MED ORDER — DIBUCAINE (PERIANAL) 1 % EX OINT: 1.0000 "application " | TOPICAL_OINTMENT | CUTANEOUS | Status: DC | PRN

## 2021-01-20 MED ORDER — TRANEXAMIC ACID-NACL 1000-0.7 MG/100ML-% IV SOLN
INTRAVENOUS | Status: AC
  Filled 2021-01-20: qty 100

## 2021-01-20 MED ORDER — SODIUM CHLORIDE 0.9% FLUSH: 3.0000 mL | INTRAVENOUS | Status: DC | PRN

## 2021-01-20 MED ORDER — MORPHINE SULFATE (PF) 0.5 MG/ML IJ SOLN
INTRAMUSCULAR | Status: DC | PRN
  Administered 2021-01-20: .15 ug via INTRATHECAL

## 2021-01-20 MED ORDER — ACETAMINOPHEN 500 MG PO TABS
1000.0000 mg | ORAL_TABLET | Freq: Four times a day (QID) | ORAL | Status: DC
  Administered 2021-01-21 – 2021-01-22 (×6): 1000 mg via ORAL
  Filled 2021-01-20 (×7): qty 2

## 2021-01-20 MED ORDER — NALBUPHINE HCL 10 MG/ML IJ SOLN
5.0000 mg | INTRAMUSCULAR | Status: DC | PRN
Start: 2021-01-20 — End: 2021-01-22

## 2021-01-20 MED ORDER — METOCLOPRAMIDE HCL 5 MG/ML IJ SOLN
INTRAMUSCULAR | Status: AC
  Filled 2021-01-20: qty 2

## 2021-01-20 MED ORDER — ACETAMINOPHEN 160 MG/5ML PO SOLN: 325.0000 mg | ORAL | Status: DC | PRN

## 2021-01-20 MED ORDER — ONDANSETRON HCL 4 MG/2ML IJ SOLN
INTRAMUSCULAR | Status: AC
  Filled 2021-01-20: qty 2

## 2021-01-20 MED ORDER — ACETAMINOPHEN 10 MG/ML IV SOLN
1000.0000 mg | Freq: Once | INTRAVENOUS | Status: DC | PRN
  Administered 2021-01-20: 1000 mg via INTRAVENOUS

## 2021-01-20 MED ORDER — SIMETHICONE 80 MG PO CHEW: 80.0000 mg | CHEWABLE_TABLET | ORAL | Status: DC | PRN

## 2021-01-20 MED ORDER — MENTHOL 3 MG MT LOZG: 1.0000 | LOZENGE | OROMUCOSAL | Status: DC | PRN

## 2021-01-20 MED ORDER — OXYTOCIN-SODIUM CHLORIDE 30-0.9 UT/500ML-% IV SOLN
INTRAVENOUS | Status: DC | PRN
  Administered 2021-01-20: 300 mL via INTRAVENOUS

## 2021-01-20 MED ORDER — ZOLPIDEM TARTRATE 5 MG PO TABS: 5.0000 mg | ORAL_TABLET | Freq: Every evening | ORAL | Status: DC | PRN

## 2021-01-20 MED ORDER — LACTATED RINGERS IV SOLN: INTRAVENOUS | Status: DC | PRN

## 2021-01-20 MED ORDER — ONDANSETRON HCL 4 MG/2ML IJ SOLN: 4.0000 mg | Freq: Three times a day (TID) | INTRAMUSCULAR | Status: DC | PRN

## 2021-01-20 MED ORDER — PHENYLEPHRINE HCL-NACL 20-0.9 MG/250ML-% IV SOLN
INTRAVENOUS | Status: DC | PRN
  Administered 2021-01-20: 60 ug/min via INTRAVENOUS

## 2021-01-20 MED ORDER — GENTAMICIN SULFATE 40 MG/ML IJ SOLN
350.0000 mg | INTRAVENOUS | Status: AC
  Administered 2021-01-20: 350 mg via INTRAVENOUS
  Filled 2021-01-20: qty 8.75

## 2021-01-20 MED ORDER — OXYTOCIN-SODIUM CHLORIDE 30-0.9 UT/500ML-% IV SOLN
INTRAVENOUS | Status: AC
  Filled 2021-01-20: qty 500

## 2021-01-20 MED ORDER — SENNOSIDES-DOCUSATE SODIUM 8.6-50 MG PO TABS
2.0000 | ORAL_TABLET | Freq: Every day | ORAL | Status: DC
  Administered 2021-01-21 – 2021-01-22 (×2): 2 via ORAL
  Filled 2021-01-20 (×2): qty 2

## 2021-01-20 MED ORDER — SCOPOLAMINE 1 MG/3DAYS TD PT72
MEDICATED_PATCH | TRANSDERMAL | Status: DC | PRN
  Administered 2021-01-20: 1 via TRANSDERMAL

## 2021-01-20 MED ORDER — NALOXONE HCL 4 MG/10ML IJ SOLN
1.0000 ug/kg/h | INTRAVENOUS | Status: DC | PRN
  Filled 2021-01-20: qty 5

## 2021-01-20 MED ORDER — ACETAMINOPHEN 10 MG/ML IV SOLN
INTRAVENOUS | Status: AC
  Filled 2021-01-20: qty 100

## 2021-01-20 MED ORDER — NALBUPHINE HCL 10 MG/ML IJ SOLN: 5.0000 mg | INTRAMUSCULAR | Status: DC | PRN

## 2021-01-20 MED ORDER — CLINDAMYCIN PHOSPHATE 900 MG/50ML IV SOLN
900.0000 mg | INTRAVENOUS | Status: AC
  Administered 2021-01-20: 900 mg via INTRAVENOUS

## 2021-01-20 MED ORDER — KETOROLAC TROMETHAMINE 30 MG/ML IJ SOLN
30.0000 mg | Freq: Four times a day (QID) | INTRAMUSCULAR | Status: AC | PRN
  Administered 2021-01-21 (×3): 30 mg via INTRAVENOUS
  Filled 2021-01-20 (×3): qty 1

## 2021-01-20 MED ORDER — MORPHINE SULFATE (PF) 0.5 MG/ML IJ SOLN
INTRAMUSCULAR | Status: AC
  Filled 2021-01-20: qty 10

## 2021-01-20 MED ORDER — PHENYLEPHRINE HCL (PRESSORS) 10 MG/ML IV SOLN
INTRAVENOUS | Status: DC | PRN
  Administered 2021-01-20 (×2): 80 ug via INTRAVENOUS

## 2021-01-20 MED ORDER — TETANUS-DIPHTH-ACELL PERTUSSIS 5-2.5-18.5 LF-MCG/0.5 IM SUSY: 0.5000 mL | PREFILLED_SYRINGE | Freq: Once | INTRAMUSCULAR | Status: DC

## 2021-01-20 MED ORDER — FENTANYL CITRATE (PF) 100 MCG/2ML IJ SOLN
INTRAMUSCULAR | Status: DC | PRN
  Administered 2021-01-20: 15 ug via INTRATHECAL

## 2021-01-20 MED ORDER — KETOROLAC TROMETHAMINE 30 MG/ML IJ SOLN
INTRAMUSCULAR | Status: AC
  Filled 2021-01-20: qty 1

## 2021-01-20 MED ORDER — SODIUM CHLORIDE 0.9 % IR SOLN
Status: DC | PRN
  Administered 2021-01-20: 1

## 2021-01-20 MED ORDER — CLINDAMYCIN PHOSPHATE 900 MG/50ML IV SOLN
INTRAVENOUS | Status: AC
  Filled 2021-01-20: qty 50

## 2021-01-20 MED ORDER — OXYTOCIN-SODIUM CHLORIDE 30-0.9 UT/500ML-% IV SOLN
2.5000 [IU]/h | INTRAVENOUS | Status: AC
  Administered 2021-01-20: 2.5 [IU]/h via INTRAVENOUS

## 2021-01-20 MED ORDER — SIMETHICONE 80 MG PO CHEW
80.0000 mg | CHEWABLE_TABLET | Freq: Three times a day (TID) | ORAL | Status: DC
  Administered 2021-01-21 – 2021-01-22 (×3): 80 mg via ORAL
  Filled 2021-01-20 (×3): qty 1

## 2021-01-20 MED ORDER — IBUPROFEN 600 MG PO TABS
600.0000 mg | ORAL_TABLET | Freq: Four times a day (QID) | ORAL | Status: DC | PRN
  Administered 2021-01-21: 600 mg via ORAL
  Filled 2021-01-20: qty 1

## 2021-01-20 MED ORDER — KETOROLAC TROMETHAMINE 30 MG/ML IJ SOLN
30.0000 mg | Freq: Four times a day (QID) | INTRAMUSCULAR | Status: AC | PRN
  Administered 2021-01-20: 30 mg via INTRAMUSCULAR

## 2021-01-20 MED ORDER — OXYCODONE HCL 5 MG PO TABS: 5.0000 mg | ORAL_TABLET | ORAL | Status: DC | PRN

## 2021-01-20 MED ORDER — FENTANYL CITRATE (PF) 100 MCG/2ML IJ SOLN: 25.0000 ug | INTRAMUSCULAR | Status: DC | PRN

## 2021-01-20 MED ORDER — NALBUPHINE HCL 10 MG/ML IJ SOLN: 5.0000 mg | Freq: Once | INTRAMUSCULAR | Status: DC | PRN

## 2021-01-20 MED ORDER — NALBUPHINE HCL 10 MG/ML IJ SOLN
5.0000 mg | Freq: Once | INTRAMUSCULAR | Status: DC | PRN
Start: 2021-01-20 — End: 2021-01-22

## 2021-01-20 MED ORDER — BUPIVACAINE IN DEXTROSE 0.75-8.25 % IT SOLN
INTRATHECAL | Status: DC | PRN
  Administered 2021-01-20: 1.6 mL via INTRATHECAL

## 2021-01-20 MED ORDER — FENTANYL CITRATE (PF) 100 MCG/2ML IJ SOLN
INTRAMUSCULAR | Status: AC
  Filled 2021-01-20: qty 2

## 2021-01-20 MED ORDER — DEXAMETHASONE SODIUM PHOSPHATE 4 MG/ML IJ SOLN
INTRAMUSCULAR | Status: AC
  Filled 2021-01-20: qty 2

## 2021-01-20 MED ORDER — POVIDONE-IODINE 10 % EX SWAB
2.0000 "application " | Freq: Once | CUTANEOUS | Status: AC
  Administered 2021-01-20: 2 via TOPICAL

## 2021-01-20 MED ORDER — WITCH HAZEL-GLYCERIN EX PADS: 1.0000 "application " | MEDICATED_PAD | CUTANEOUS | Status: DC | PRN

## 2021-01-20 MED ORDER — DIPHENHYDRAMINE HCL 25 MG PO CAPS: 25.0000 mg | ORAL_CAPSULE | Freq: Four times a day (QID) | ORAL | Status: DC | PRN

## 2021-01-20 MED ORDER — ONDANSETRON HCL 4 MG/2ML IJ SOLN
INTRAMUSCULAR | Status: DC | PRN
  Administered 2021-01-20: 4 mg via INTRAVENOUS

## 2021-01-20 MED ORDER — LACTATED RINGERS IV SOLN: INTRAVENOUS | Status: DC

## 2021-01-20 MED ORDER — TRANEXAMIC ACID-NACL 1000-0.7 MG/100ML-% IV SOLN
INTRAVENOUS | Status: DC | PRN
  Administered 2021-01-20: 1000 mg via INTRAVENOUS

## 2021-01-20 MED ORDER — SCOPOLAMINE 1 MG/3DAYS TD PT72: 1.0000 | MEDICATED_PATCH | Freq: Once | TRANSDERMAL | Status: DC

## 2021-01-20 MED ORDER — DIPHENHYDRAMINE HCL 50 MG/ML IJ SOLN: 12.5000 mg | INTRAMUSCULAR | Status: DC | PRN

## 2021-01-20 MED ORDER — PROMETHAZINE HCL 25 MG/ML IJ SOLN: 6.2500 mg | INTRAMUSCULAR | Status: DC | PRN

## 2021-01-20 MED ORDER — METOCLOPRAMIDE HCL 5 MG/ML IJ SOLN
INTRAMUSCULAR | Status: DC | PRN
  Administered 2021-01-20: 10 mg via INTRAVENOUS

## 2021-01-20 MED ORDER — DEXAMETHASONE SODIUM PHOSPHATE 4 MG/ML IJ SOLN
INTRAMUSCULAR | Status: DC | PRN
  Administered 2021-01-20: 8 mg via INTRAVENOUS

## 2021-01-20 MED ORDER — PHENYLEPHRINE HCL-NACL 20-0.9 MG/250ML-% IV SOLN
INTRAVENOUS | Status: AC
  Filled 2021-01-20: qty 250

## 2021-01-20 MED ORDER — DIPHENHYDRAMINE HCL 25 MG PO CAPS: 25.0000 mg | ORAL_CAPSULE | ORAL | Status: DC | PRN

## 2021-01-20 MED ORDER — PRENATAL MULTIVITAMIN CH
1.0000 | ORAL_TABLET | Freq: Every day | ORAL | Status: DC
  Administered 2021-01-21 – 2021-01-22 (×2): 1 via ORAL
  Filled 2021-01-20 (×2): qty 1

## 2021-01-20 MED ORDER — COCONUT OIL OIL
1.0000 "application " | TOPICAL_OIL | Status: DC | PRN
  Administered 2021-01-21: 1 via TOPICAL

## 2021-01-20 SURGICAL SUPPLY — 35 items
APL SKNCLS STERI-STRIP NONHPOA (GAUZE/BANDAGES/DRESSINGS) ×1
BENZOIN TINCTURE PRP APPL 2/3 (GAUZE/BANDAGES/DRESSINGS) ×2 IMPLANT
CHLORAPREP W/TINT 26ML (MISCELLANEOUS) ×2 IMPLANT
CLAMP CORD UMBIL (MISCELLANEOUS) IMPLANT
CLOTH BEACON ORANGE TIMEOUT ST (SAFETY) ×2 IMPLANT
DRSG OPSITE POSTOP 4X10 (GAUZE/BANDAGES/DRESSINGS) ×2 IMPLANT
ELECT REM PT RETURN 9FT ADLT (ELECTROSURGICAL) ×2
ELECTRODE REM PT RTRN 9FT ADLT (ELECTROSURGICAL) ×1 IMPLANT
EXTRACTOR VACUUM M CUP 4 TUBE (SUCTIONS) IMPLANT
GLOVE BIOGEL PI IND STRL 7.0 (GLOVE) ×2 IMPLANT
GLOVE BIOGEL PI IND STRL 7.5 (GLOVE) ×2 IMPLANT
GLOVE BIOGEL PI INDICATOR 7.0 (GLOVE) ×2
GLOVE BIOGEL PI INDICATOR 7.5 (GLOVE) ×2
GLOVE ECLIPSE 7.5 STRL STRAW (GLOVE) ×2 IMPLANT
GOWN STRL REUS W/TWL LRG LVL3 (GOWN DISPOSABLE) ×6 IMPLANT
HEMOSTAT ARISTA ABSORB 3G PWDR (HEMOSTASIS) ×2 IMPLANT
KIT ABG SYR 3ML LUER SLIP (SYRINGE) IMPLANT
NEEDLE HYPO 25X5/8 SAFETYGLIDE (NEEDLE) IMPLANT
NS IRRIG 1000ML POUR BTL (IV SOLUTION) ×2 IMPLANT
PACK C SECTION WH (CUSTOM PROCEDURE TRAY) ×2 IMPLANT
PAD ABD 7.5X8 STRL (GAUZE/BANDAGES/DRESSINGS) ×2 IMPLANT
PAD OB MATERNITY 4.3X12.25 (PERSONAL CARE ITEMS) ×2 IMPLANT
PENCIL SMOKE EVAC W/HOLSTER (ELECTROSURGICAL) ×2 IMPLANT
RTRCTR C-SECT PINK 25CM LRG (MISCELLANEOUS) ×2 IMPLANT
SPONGE GAUZE 4X4 12PLY STER LF (GAUZE/BANDAGES/DRESSINGS) ×4 IMPLANT
STRIP CLOSURE SKIN 1/2X4 (GAUZE/BANDAGES/DRESSINGS) ×2 IMPLANT
SUT VIC AB 0 CT1 36 (SUTURE) ×4 IMPLANT
SUT VIC AB 0 CTX 36 (SUTURE) ×4
SUT VIC AB 0 CTX36XBRD ANBCTRL (SUTURE) ×2 IMPLANT
SUT VIC AB 2-0 CT1 27 (SUTURE) ×2
SUT VIC AB 2-0 CT1 TAPERPNT 27 (SUTURE) ×1 IMPLANT
SUT VIC AB 4-0 KS 27 (SUTURE) ×2 IMPLANT
TOWEL OR 17X24 6PK STRL BLUE (TOWEL DISPOSABLE) ×2 IMPLANT
TRAY FOLEY W/BAG SLVR 14FR LF (SET/KITS/TRAYS/PACK) ×2 IMPLANT
WATER STERILE IRR 1000ML POUR (IV SOLUTION) ×2 IMPLANT

## 2021-01-20 NOTE — Anesthesia Procedure Notes (Signed)
Spinal  Start time: 01/20/2021 4:19 PM End time: 01/20/2021 4:21 PM Reason for block: surgical anesthesia Staffing Performed: anesthesiologist  Anesthesiologist: Shelton Silvas, MD Preanesthetic Checklist Completed: patient identified, IV checked, site marked, risks and benefits discussed, surgical consent, monitors and equipment checked, pre-op evaluation and timeout performed Spinal Block Patient position: sitting Prep: DuraPrep and site prepped and draped Location: L3-4 Injection technique: single-shot Needle Needle type: Pencan  Needle gauge: 24 G Needle length: 10 cm Needle insertion depth: 10 cm Additional Notes Patient tolerated well. No immediate complications.

## 2021-01-20 NOTE — Plan of Care (Signed)
Pt admitted to the unit. Oriented to the room.  Reviewed admission packet to include menu, breastfeeding log, Edinburgh score and Baby safety sheet.  Pt verbalizes understanding.  Care plan reviewed.  Denies pain at this time.  Able to lift both lower extremities without difficulties.  Instructed to call with any questions or concerns.  Pt verbalizes understanding.

## 2021-01-20 NOTE — Progress Notes (Signed)
   NURSE VISIT- BLOOD PRESSURE CHECK  SUBJECTIVE:  Martha Herrera is a 25 y.o. G57P1001 female here for BP check. She is [redacted]w[redacted]d pregnant    HYPERTENSION ROS:  Pregnant:  Severe headaches that don't go away with tylenol/other medicines: No  Visual changes (seeing spots/double/blurred vision) No  Severe pain under right breast breast or in center of upper chest No  Severe nausea/vomiting No  Taking medicines as instructed not applicable  OBJECTIVE:  BP (!) 130/94   Pulse (!) 109   LMP 04/24/2020   Appearance alert, well appearing, and in no distress and oriented to person, place, and time.  ASSESSMENT: Pregnancy [redacted]w[redacted]d  blood pressure check  PLAN: Discussed with  Dr Annia Friendly    Recommendations:  Call made to attending at [facility] for possible C-section today    Follow-up:  As directed by MAU discharge orders    Sharleen Szczesny [address] [place]  01/20/2021 9:06 AM

## 2021-01-20 NOTE — Lactation Note (Signed)
This note was copied from a baby's chart. Lactation Consultation Note  Patient Name: Girl Novah Nessel [id number] Date: 01/20/2021 Reason for consult: Initial assessment Age:25 hours  Initial visit to 3 hours old infant of a P2 mother. Infant is cueing upon arrival, offered assistance with latch. LC demonstrated alignment, support pillows, and hand expression. Infant latches at once with rhythmic suckling and audible swallows. Mother can hand express and plentiful colostrum. Discussed normal newborn behavior and patterns, signs of good milk transfer, hunger cues, tummy size and benefits of skin to skin.   Plan: 1-Deep, comfortable latch and breastfeeding on demand or 8-12 times in 24h period. 2-Encouraged maternal rest, hydration and food intake.  3-Contact LC as needed for feeds/support/concerns/questions   All questions answered at this time. Provided Lactation services brochure and promoted [employer] booklet information.    Maternal Data Has patient been taught Hand Expression?: Yes Does the patient have breastfeeding experience prior to this delivery?: Yes How long did the patient breastfeed?: 1 month  Feeding Mother's Current Feeding Choice: Breast Milk  LATCH Score Latch: Grasps breast easily, tongue down, lips flanged, rhythmical sucking.  Audible Swallowing: Spontaneous and intermittent  Type of Nipple: Everted at rest and after stimulation  Comfort (Breast/Nipple): Soft / non-tender  Hold (Positioning): Assistance needed to correctly position infant at breast and maintain latch.  LATCH Score: 9  Interventions Interventions: Breast feeding basics reviewed;Assisted with latch;Skin to skin;Breast massage;Hand express;Adjust position;Support pillows;Position options;Expressed milk;Education  Discharge Pump: Personal WIC Program: Yes  Consult Status Consult Status: Follow-up Date: 01/21/21 Follow-up type: In-patient    Bethene Hankinson A Higuera [place] 01/20/2021, 8:01 PM

## 2021-01-20 NOTE — Op Note (Addendum)
Cesarean Section and Bilateral Tubal Ligation Operative Report  MACKAYLA MULLINS  01/20/2021  Indications:  Scheduled repeat CS    Pre-operative Diagnosis: repeat cesarean section with bilateral tubal ligation; undesired fertility; Gestational hypertension  Post-operative Diagnosis: Same   Surgeon: Surgeon(s) and Role:    * Wouk, Wilfred Curtis, MD - Primary    * Warner Mccreedy, MD - Fellow    Anesthesia: spinal    Estimated Blood Loss: 277 ml  Total IV Fluids: 2300 ml LR  Urine Output:: 100 ml clear urine  Specimens: right and left fallopian tubes  Findings: Viable female infant in vertex presentation; Apgars 9,9; weight 3435 g;  clear amniotic fluid; intact placenta with three vessel cord; normal uterus, fallopian tubes and ovaries bilaterally.  Baby condition / location:  Couplet care / Skin to Skin   Complications: no complications  Indications: ELANORA QUIN is a 25 y.o. [id number] with an IUP [redacted]w[redacted]d presenting for scheduled cesarean section in setting of gHTN.  The risks, benefits, complications, treatment options, and exected outcomes were discussed with the patient . The patient dwith the proposed plan, giving informed consent. identified as [name] and the procedure verified as C-Section Delivery.  Procedure Details:  The patient was taken back to the operative suite where spinal anesthesia was placed.  A time out was held and the above information confirmed.   After induction of anesthesia, the patient was draped and prepped in the usual sterile manner and placed in a dorsal supine position with a leftward tilt. A Pfannenstiel incision was made and carried down through the subcutaneous tissue to the fascia. Fascial incision was made and sharply extended transversely. The fascia was separated from the underlying rectus tissue superiorly and inferiorly. The peritoneum was identified and sharply entered and extended longitudinally. Alexis retractor was placed. A low  transverse uterine incision was made and extended bluntly. Delivered from cephalic presentation was a viable infant with Apgars and weight as above.  After waiting 60 seconds for delayed cord cutting, the umbilical cord was clamped and cut cord blood was obtained for evaluation. Cord ph was not sent. The placenta was removed Intact and appeared normal. The uterine outline, tubes and ovaries appeared normal. There was an inferior excision towards the cervix and the uterine incision and extension was closed with running locked sutures of 0-Vicryl suture.   Hemostasis was observed.   Attention was then turned to the fallopian tubes. Pomeroy:The right Fallopian tube was palpated and then traced to it's fimbriae and an avascular midsection of the tube approximately 3-4cm from the cornua was grasped with the Babcock clamps and brought into a knuckle. The tube was double ligated with 3-0 plain gut suture and the intervening portion of tube was transected and removed. Excellent hemostasis was noted.  Attention was then turned to the left fallopian tube, after confirmation of identification by tracing the tube out to the fimbriae. The same procedure was then performed on the right Fallopian tube with excellent hemostasis noted.  The abdomen and the pelvis were cleared of all clot and debris and the Jon Gills was removed. Hemostasis was confirmed on all surfaces.  The fascia was then closed using 0 Vicryl in a running fashion. The subcutaneous layer was reapproximated with plain gut and the skin was closed with a 4-0 vicryl subcuticular stitch. The patient tolerated the procedure well. Sponge, lap, instrument and needle counts were correct x 2. She was taken to the recovery room in stable condition.   Instrument, sponge,  and needle counts were correct prior the abdominal closure and were correct at the conclusion of the case.     Disposition: PACU - hemodynamically stable.   Maternal Condition: stable      Signed: Warner Mccreedy, MD, MPH OB Fellow, Faculty Practice

## 2021-01-20 NOTE — Progress Notes (Unsigned)
Patient presented for a BP check, discussed with RN and review chart.   Previous ow risk pregnancy, seen for prenatal care yesterday with elevated BP 143/90 and BP slowly increasing over the past several weeks. Pre-e labs drawn and WNL. Today BP 130/92 and 130/94 on recheck. No symptoms. Planned for repeat C/S on 9/28.   Discussed with Dr. Ashok Pall, L&D attending at [facility], for admit for repeat C/S due to gestational hypertension. He will admit and proceed with C/S this afternoon. Patient sent to the MAU in stable condition.   Allayne Stack, DO

## 2021-01-20 NOTE — Transfer of Care (Signed)
Immediate Anesthesia Transfer of Care Note  Patient: Martha Herrera  Procedure(s) Performed: CESAREAN SECTION  Patient Location: PACU  Anesthesia Type:Spinal  Level of Consciousness: awake, alert  and oriented  Airway & Oxygen Therapy: Patient Spontanous Breathing  Post-op Assessment: Report given to RN and Post -op Vital signs reviewed and stable  Post vital signs: Reviewed and stable  Last Vitals:  Vitals Value Taken Time  BP 102/61 01/20/21 1753  Temp    Pulse 96 01/20/21 1755  Resp 19 01/20/21 1755  SpO2 97 % 01/20/21 1755  Vitals shown include unvalidated device data.  Last Pain:  Vitals:   01/20/21 1111  TempSrc: Oral         Complications: No notable events documented.

## 2021-01-20 NOTE — Progress Notes (Signed)
Patient was evaluated by nursing staff. Agree with assessment and plan. See my separate note.

## 2021-01-20 NOTE — Anesthesia Preprocedure Evaluation (Addendum)
Anesthesia Evaluation  Patient identified by MRN, date of birth, ID band Patient awake    Reviewed: Allergy & Precautions, NPO status , Patient's Chart, lab work & pertinent test results  Airway Mallampati: I  TM Distance: >3 FB Neck ROM: Full    Dental  (+) Teeth Intact, Dental Advisory Given   Pulmonary    breath sounds clear to auscultation       Cardiovascular  Rhythm:Regular Rate:Normal     Neuro/Psych  Headaches, negative psych ROS   GI/Hepatic Neg liver ROS, GERD  Medicated,  Endo/Other  diabetes, Gestational  Renal/GU negative Renal ROS     Musculoskeletal negative musculoskeletal ROS (+)   Abdominal Normal abdominal exam  (+)   Peds  Hematology negative hematology ROS (+)   Anesthesia Other Findings   Reproductive/Obstetrics (+) Pregnancy                            Anesthesia Physical Anesthesia Plan  ASA: 3  Anesthesia Plan: Spinal   Post-op Pain Management:    Induction:   PONV Risk Score and Plan: 0 and Ondansetron  Airway Management Planned: Natural Airway  Additional Equipment: None  Intra-op Plan:   Post-operative Plan:   Informed Consent: I have reviewed the patients History and Physical, chart, labs and discussed the procedure including the risks, benefits and alternatives for the proposed anesthesia with the patient or authorized representative who has indicated his/her understanding and acceptance.     Dental advisory given  Plan Discussed with: CRNA  Anesthesia Plan Comments: (Lab Results      Component                Value               Date                      WBC                      9.3                 01/19/2021                HGB                      11.4                01/19/2021                HCT                      35.0                01/19/2021                MCV                      76 (L)              01/19/2021                PLT                       282                 01/19/2021           )  Anesthesia Quick Evaluation  

## 2021-01-20 NOTE — Discharge Summary (Addendum)
Postpartum Discharge Summary     Patient Name: Martha Herrera DOB: [birth year] MRN: [id number]  Date of admission: 01/20/2021 Delivery date:01/20/2021  Delivering provider: Laurey Arrow [facility]  Date of discharge: 01/22/2021  Admitting diagnosis: Gestational hypertension [O13.9] Intrauterine pregnancy: [redacted]w[redacted]d    Secondary diagnosis:  Active Problems:   S/P cesarean section   Encounter for supervision of normal pregnancy, antepartum   Gestational hypertension   History of bilateral tubal ligation  Additional problems: None    Discharge diagnosis: Term Pregnancy Delivered and Gestational Hypertension                                              Post partum procedures:postpartum tubal ligation Augmentation:  None Complications: None  Hospital course: Scheduled C/S   25y.o. yo G2P2002 at 317w5das admitted to the hospital 01/20/2021 for scheduled cesarean section with the following indication:Elective Repeat.Delivery details are as follows:  Membrane Rupture Time/Date: 4:53 PM ,01/20/2021   Delivery Method:C-Section, Low Transverse  Details of operation can be found in separate operative note.  Patient had an uncomplicated postpartum course.  She is ambulating, tolerating a regular diet, passing flatus, and urinating well. Patient is discharged home in stable condition on  01/22/21        Newborn Data: Birth date:01/20/2021  Birth time:4:54 PM  Gender:Female  Living status:Living  Apgars:9 ,9  Weight:3435 g     Magnesium Sulfate received: No BMZ received: No Rhophylac:N/A MMR:N/A T-DaP:Given prenatally Flu: N/A Transfusion:No  Physical exam  Vitals:   01/21/21 0410 01/21/21 1120 01/21/21 1735 01/22/21 0000  BP: (!) 95/56 103/62 108/62 (!) 101/43  Pulse: (!) 52 74 68 76  Resp: _0 Temp:  98.2 F (36.8 C) 97.6 F (36.4 C) 98.2 F (36.8 C)  TempSrc:  Oral Oral Oral  SpO2:  99% 99% 98%  Weight:      Height:       General: alert, cooperative, and no  distress Lochia: appropriate Uterine Fundus: firm Incision: Healing well with no significant drainage, No significant erythema, Dressing is clean, dry, and intact DVT Evaluation: No evidence of DVT seen on physical exam. No cords or calf tenderness. No significant calf/ankle edema. Labs: Lab Results  Component Value Date   WBC 14.0 (H) 01/21/2021   HGB 9.8 (L) 01/21/2021   HCT 30.8 (L) 01/21/2021   MCV 78.0 (L) 01/21/2021   PLT 255 01/21/2021   CMP Latest Ref Rng & Units 01/20/2021  Glucose 70 - 99 mg/dL 77  BUN 6 - 20 mg/dL 8  Creatinine 0.44 - 1.00 mg/dL 0.61  Sodium 135 - 145 mmol/L 135  Potassium 3.5 - 5.1 mmol/L 3.8  Chloride 98 - 111 mmol/L 103  CO2 22 - 32 mmol/L 23  Calcium 8.9 - 10.3 mg/dL 8.8(L)  Total Protein 6.5 - 8.1 g/dL 6.4(L)  Total Bilirubin 0.3 - 1.2 mg/dL 0.4  Alkaline Phos 38 - 126 U/L 181(H)  AST 15 - 41 U/L 20  ALT 0 - 44 U/L 16   Edinburgh Score: Edinburgh Postnatal Depression Scale Screening Tool 01/21/2021  I have been able to laugh and see the funny side of things. 0  I have looked forward with enjoyment to things. 0  I have blamed myself unnecessarily when things went wrong. 0  I have been anxious or worried for no good  reason. 2  I have felt scared or panicky for no good reason. 1  Things have been getting on top of me. 0  I have been so unhappy that I have had difficulty sleeping. 0  I have felt sad or miserable. 0  I have been so unhappy that I have been crying. 0  The thought of harming myself has occurred to me. 0  Edinburgh Postnatal Depression Scale Total 3     After visit meds:  Allergies as of 01/22/2021       Reactions   Penicillins Hives, Itching, Rash   Did it involve swelling of the face/tongue/throat, SOB, or low BP? No Did it involve sudden or severe rash/hives, skin peeling, or any reaction on the inside of your mouth or nose? No Did you need to seek medical attention at a hospital or doctor's office? No When did it last  happen?     childhood allergy  If all above answers are "NO", may proceed with cephalosporin use.   Amoxil [amoxicillin] Itching, Rash   Did it involve swelling of the face/tongue/throat, SOB, or low BP? No Did it involve sudden or severe rash/hives, skin peeling, or any reaction on the inside of your mouth or nose? No Did you need to seek medical attention at a hospital or doctor's office? No When did it last happen?     2019 If all above answers are "NO", may proceed with cephalosporin use.        Medication List     STOP taking these medications    acetaminophen 500 MG tablet Commonly known as: TYLENOL   Blood Pressure Monitor Misc   Doxylamine-Pyridoxine 10-10 MG Tbec Commonly known as: Diclegis   loratadine 10 MG tablet Commonly known as: CLARITIN   omeprazole 40 MG capsule Commonly known as: PRILOSEC   ondansetron 4 MG disintegrating tablet Commonly known as: Zofran ODT       TAKE these medications    ibuprofen 600 MG tablet Commonly known as: ADVIL Take 1 tablet (600 mg total) by mouth every 6 (six) hours as needed for moderate pain.   oxyCODONE 5 MG immediate release tablet Commonly known as: Oxy IR/ROXICODONE Take 1 tablet (5 mg total) by mouth every 4 (four) hours as needed for moderate pain.   PRENATAL VITAMIN PO Take 2 tablets by mouth daily. Gummies         Discharge home in stable condition Infant Feeding: Breast Infant Disposition:home with mother Discharge instruction: per After Visit Summary and Postpartum booklet. Activity: Advance as tolerated. Pelvic rest for 6 weeks.  Diet: routine diet Future Appointments: Future Appointments  Date Time Provider [place]  01/28/2021 11:50 AM Florian Buff, MD [facility]  02/18/2021 12:10 PM Janyth Pupa, DO [facility]   Follow up Visit: Message sent to [facility] by Dr. Cy Blamer on 9/22 Please schedule this patient for a In person postpartum visit in 4 weeks with the following provider:  MD. Additional Postpartum F/U:Incision check 1 week and BP check High risk pregnancy complicated by: HTN Delivery mode:  C-Section, Low Transverse  Anticipated Birth Control:  BTL done [facility]   01/22/2021 Gifford Shave, MD  CNM attestation I have seen and examined this patient and agree with above documentation in the resident's note.   DMYA LONG is a 25 y.o. [id number] s/p rLTCS.   Pain is well controlled.  Plan for birth control is bilateral tubal ligation.  Method of Feeding: bottle  PE:  BP (!) 101/43 (  BP Location: Right Arm)   Pulse 76   Temp 98.2 F (36.8 C) (Oral)   Resp 17   Ht _0  (1.473 m)   Wt 87.5 kg   LMP 04/24/2020   SpO2 98%   Breastfeeding Unknown   BMI 40.30 kg/m  Fundus firm  Recent Labs    01/23/21 2309  HGB 10.0*  HCT 31.8*     Plan: discharge today - postpartum care discussed - f/u clinic in 1 week for BP/incision check, then 4 weeks for postpartum visit   Myrtis Ser, CNM 9:39 PM

## 2021-01-20 NOTE — H&P (Signed)
OBSTETRIC ADMISSION HISTORY AND PHYSICAL  Martha Herrera is a 25 y.o. female G2P1001 with IUP at 52w5dby early [place] for scheduled repeat cesarean section in setting of gHTN. She reports +FMs, No LOF, no VB, no blurry vision, headaches or peripheral edema, and RUQ pain.  She plans on breast feeding. She request BTL for birth control. [facility]  Dating: By [place]--->  Estimated Date of Delivery: 01/29/21  Sono:    @[redacted]w[redacted]d , CWD, normal anatomy, breech presentation, posterior placental lie, 372g, 92% EFW   Prenatal History/Complications:  gHTN   Past Medical History: Past Medical History:  Diagnosis Date   Complication of anesthesia    hard to wake up high level with CS   Gastroesophageal reflux    takes prescribed meds daily   Gestational diabetes    Headache(784.0)    not as often   Heart murmur    Pneumonia 2013   Varicella     Past Surgical History: Past Surgical History:  Procedure Laterality Date   CESAREAN SECTION N/A 08/07/2018   Procedure: CESAREAN SECTION;  Surgeon: EFlorian Buff MD;  Location: [facility];  Service: Obstetrics;  Laterality: N/A;   ESOPHAGOGASTRODUODENOSCOPY  05/03/2012   Procedure: ESOPHAGOGASTRODUODENOSCOPY (EGD);  Surgeon: JOletha Blend MD;  [place] [facility]  Service: Gastroenterology;  Laterality: N/A;   tubes in ears     WISDOM TOOTH EXTRACTION      Obstetrical History: OB History     Gravida  2   Para  1   Term  1   Preterm  0   AB  0   Living  1      SAB  0   IAB  0   Ectopic  0   Multiple  0   Live Births  1           Social History Social History   Socioeconomic History   Marital status: Married    Spouse name: RLiboria Putnam  Number of children: 1   Years of education: Not on file   Highest education level: Associate degree: academic program  Occupational History   Not on file  Tobacco Use   Smoking status: Never   Smokeless tobacco: Never  Vaping Use   Vaping  Use: Never used  Substance and Sexual Activity   Alcohol use: No   Drug use: No   Sexual activity: Yes    Birth control/protection: None  Other Topics Concern   Not on file  Social History Narrative   10th grade   Social Determinants of Health   Financial Resource Strain: Low Risk    Difficulty of Paying Living Expenses: Not hard at all  Food Insecurity: No Food Insecurity   Worried About RCharity fundraiserin the Last Year: Never true   RPinein the Last Year: Never true  Transportation Needs: No Transportation Needs   Lack of Transportation (Medical): No   Lack of Transportation (Non-Medical): No  Physical Activity: Unknown   Days of Exercise per Week: Patient refused   Minutes of Exercise per Session: Patient refused  Stress: No Stress Concern Present   Feeling of Stress : Not at all  Social Connections: Moderately Isolated   Frequency of Communication with Friends and Family: More than three times a week   Frequency of Social Gatherings with Friends and Family: More than three times a week   Attends Religious Services:  Never   Active Member of Clubs or Organizations: No   Attends Archivist Meetings: Never   Marital Status: Married    Family History: Family History  Problem Relation Age of Onset   GER disease Father    Cancer Father        kidney   Asthma Brother    Miscarriages / [place] Brother    GER disease Maternal Grandmother    Hyperlipidemia Maternal Grandmother    ADD / ADHD Brother    Miscarriages / Stillbirths Mother    Headache Mother    Cancer Maternal Grandfather    Diabetes Maternal Grandfather    Hyperlipidemia Maternal Grandfather    Vision loss Maternal Grandfather    GER disease Maternal Aunt    Migraines Maternal Aunt    Anxiety disorder Maternal Aunt    Diabetes Paternal Aunt    Cancer Paternal Uncle        spinal cancer   Hernia Son    Ataxia Neg Hx    [place] Neg Hx    Dementia Neg Hx    Mental  retardation Neg Hx    Multiple sclerosis Neg Hx    Neurofibromatosis Neg Hx    Neuropathy Neg Hx    Parkinsonism Neg Hx    Seizures Neg Hx    Stroke Neg Hx    Depression Neg Hx    Bipolar disorder Neg Hx     Allergies: Allergies  Allergen Reactions   Penicillins Hives, Itching and Rash    Did it involve swelling of the face/tongue/throat, SOB, or low BP? No Did it involve sudden or severe rash/hives, skin peeling, or any reaction on the inside of your mouth or nose? No Did you need to seek medical attention at a hospital or doctor's office? No When did it last happen?     childhood allergy  If all above answers are "NO", may proceed with cephalosporin use.    Amoxil [Amoxicillin] Itching and Rash    Did it involve swelling of the face/tongue/throat, SOB, or low BP? No Did it involve sudden or severe rash/hives, skin peeling, or any reaction on the inside of your mouth or nose? No Did you need to seek medical attention at a hospital or doctor's office? No When did it last happen?     2019 If all above answers are "NO", may proceed with cephalosporin use.    Medications Prior to Admission  Medication Sig Dispense Refill Last Dose   acetaminophen (TYLENOL) 500 MG tablet Take 1,000 mg by mouth every 6 (six) hours as needed (Migraine).      loratadine (CLARITIN) 10 MG tablet Take 10 mg by mouth daily.   01/19/2021   omeprazole (PRILOSEC) 40 MG capsule Take 1 capsule (40 mg total) by mouth daily. 30 capsule 6 01/19/2021   ondansetron (ZOFRAN ODT) 4 MG disintegrating tablet Take 1 tablet (4 mg total) by mouth every 8 (eight) hours as needed for nausea or vomiting. 30 tablet 3 01/19/2021   Prenatal Vit-Fe Fumarate-FA (PRENATAL VITAMIN PO) Take 2 tablets by mouth daily. Gummies   01/19/2021   Blood Pressure Monitor MISC For regular home bp monitoring during pregnancy 1 each 0    Doxylamine-Pyridoxine (DICLEGIS) 10-10 MG TBEC Take 2 qhs; may also take one in am and one in afternoon prn nausea  (Patient not taking: No sig reported) 120 tablet 6 Not Taking     Review of Systems   All systems reviewed and negative except as stated  in HPI  Last menstrual period 04/24/2020. General appearance: alert Lungs: clear to auscultation bilaterally Heart: regular rate and rhythm Abdomen: soft, non-tender; bowel sounds normal Extremities: Homans sign is negative, no sign of DVT Presentation: cephalic Fetal monitoringBaseline: 125 bpm, Variability: Good {> 6 bpm), Accelerations: Reactive, and Decelerations: Absent Uterine activity: irregular     Prenatal labs: ABO, Rh: O/Positive/-- (03/21 1108) Antibody: Negative (07/07 0838) Rubella: 1.47 (03/21 1108) RPR: Non Reactive (07/07 0838)  HBsAg: Negative (03/21 1108)  HIV: Non Reactive (07/07 7989)  GBS: --[place] Cloud (09/06 1330)  2 hr Glucola normal Genetic screening  negative Anatomy US normal  Prenatal Transfer Tool  Maternal Diabetes: No Genetic Screening: Normal Maternal Ultrasounds/Referrals: Normal Fetal Ultrasounds or other Referrals:  None Maternal Substance Abuse:  No Significant Maternal Medications:  None Significant Maternal Lab Results: Group B Strep negative  Results for orders placed or performed in visit on 01/20/21 (from the past 24 hour(s))  POC Urinalysis Dipstick OB   Collection Time: 01/20/21  9:00 AM  Result Value Ref Range   Color, UA     Clarity, UA     Glucose, UA Negative Negative   Bilirubin, UA     Ketones, UA neg    Spec Grav, UA     Blood, UA neg    pH, UA     POC,PROTEIN,UA Negative Negative, Trace, Small (1+), Moderate (2+), Large (3+), 4+   Urobilinogen, UA     Nitrite, UA neg    Leukocytes, UA Negative Negative   Appearance     Odor    Results for orders placed or performed in visit on 01/19/21 (from the past 24 hour(s))  Protein / creatinine ratio, urine   Collection Time: 01/19/21  2:49 PM  Result Value Ref Range   Creatinine, Urine WILL FOLLOW    Protein, Ur WILL FOLLOW     Protein/Creat Ratio WILL FOLLOW   Comp Met (CMET)   Collection Time: 01/19/21  2:49 PM  Result Value Ref Range   Glucose 108 (H) 65 - 99 mg/dL   BUN 10 6 - 20 mg/dL   Creatinine, Ser 0.78 0.57 - 1.00 mg/dL   eGFR 108 >59 mL/min/1.73   BUN/Creatinine Ratio 13 9 - 23   Sodium 135 134 - 144 mmol/L   Potassium 3.9 3.5 - 5.2 mmol/L   Chloride 100 96 - 106 mmol/L   CO2 19 (L) 20 - 29 mmol/L   Calcium 8.9 8.7 - 10.2 mg/dL   Total Protein 6.4 6.0 - 8.5 g/dL   Albumin 3.5 (L) 3.9 - 5.0 g/dL   Globulin, Total 2.9 1.5 - 4.5 g/dL   Albumin/Globulin Ratio 1.2 1.2 - 2.2   Bilirubin Total 0.3 0.0 - 1.2 mg/dL   Alkaline Phosphatase 197 (H) 44 - 121 IU/L   AST 13 0 - 40 IU/L   ALT 12 0 - 32 IU/L  CBC   Collection Time: 01/19/21  2:49 PM  Result Value Ref Range   WBC 9.3 3.4 - 10.8 x10E3/uL   RBC 4.58 3.77 - 5.28 x10E6/uL   Hemoglobin 11.4 11.1 - 15.9 g/dL   Hematocrit 35.0 34.0 - 46.6 %   MCV 76 (L) 79 - 97 fL   MCH 24.9 (L) 26.6 - 33.0 pg   MCHC 32.6 31.5 - 35.7 g/dL   RDW 14.4 11.7 - 15.4 %   Platelets 282 150 - 450 x10E3/uL    Patient Active Problem List   Diagnosis Date Noted   Encounter for supervision  of normal pregnancy, antepartum 07/19/2020   S/P cesarean section 08/07/2018   History of gestational diabetes 05/13/2018   Tension headache 01/13/2013   Migraine without aura 01/13/2013   Cardiac murmur 07/25/2012   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 G2P1001 at 31w5dhere for repeat cesarean section in setting of gHTN Repeat CS The risks of cesarean section were discussed with the patient including but were not limited to: bleeding which may require transfusion or reoperation; infection which may require antibiotics; injury to bowel, bladder, ureters or other surrounding organs; injury to the fetus; need for additional procedures including hysterectomy in the event of a  life-threatening hemorrhage; placental abnormalities wth subsequent pregnancies, incisional problems, thromboembolic phenomenon and other postoperative/anesthesia complications.   Patient also desires permanent sterilization.  Other reversible forms of contraception were discussed with patient; she declines all other modalities. Risks of procedure discussed with patient including but not limited to: risk of regret, permanence of method, bleeding, infection, injury to surrounding organs and need for additional procedures.  Failure risk of about 1% with increased risk of ectopic gestation if pregnancy occurs was also discussed with patient.  Also discussed possibility of post-tubal pain syndrome. The patient concurred with the proposed plan, giving informed written consent for the procedures.  She will remain NPO for procedure. Anesthesia and OR aware.  Preoperative prophylactic antibiotics and SCDs ordered on call to the OR.  To OR when ready. #Pain: spinal #FWB: Reactive NST #ID:  GBS negative, will get pre-op abx #MOF: breast pump  #MOC:BTL (consent signed 8/1) #Circ:  N/A  #gHTN Patient with elevated BP in 130s/90s in the office. Pre-E labs sent at admission - f/up pre-e labs  ARenard Matter MD, MPH OB Fellow, [facility]

## 2021-01-21 ENCOUNTER — Encounter (HOSPITAL_COMMUNITY): Payer: Self-pay | Admitting: Obstetrics and Gynecology

## 2021-01-21 LAB — CBC
HCT: 30.8 % — ABNORMAL LOW (ref 36.0–46.0)
Hemoglobin: 9.8 g/dL — ABNORMAL LOW (ref 12.0–15.0)
MCH: 24.8 pg — ABNORMAL LOW (ref 26.0–34.0)
MCHC: 31.8 g/dL (ref 30.0–36.0)
MCV: 78 fL — ABNORMAL LOW (ref 80.0–100.0)
Platelets: 255 10*3/uL (ref 150–400)
RBC: 3.95 MIL/uL (ref 3.87–5.11)
RDW: 14.3 % (ref 11.5–15.5)
WBC: 14 10*3/uL — ABNORMAL HIGH (ref 4.0–10.5)
nRBC: 0 % (ref 0.0–0.2)

## 2021-01-21 LAB — RPR: RPR Ser Ql: NONREACTIVE

## 2021-01-21 MED ORDER — SODIUM CHLORIDE 0.9 % IV SOLN
500.0000 mg | Freq: Once | INTRAVENOUS | Status: AC
  Administered 2021-01-21: 500 mg via INTRAVENOUS
  Filled 2021-01-21: qty 25

## 2021-01-21 NOTE — Anesthesia Postprocedure Evaluation (Signed)
Anesthesia Post Note  Patient: Martha Herrera  Procedure(s) Performed: CESAREAN SECTION     Patient location during evaluation: PACU Anesthesia Type: Spinal Level of consciousness: oriented and awake and alert Pain management: pain level controlled Vital Signs Assessment: post-procedure vital signs reviewed and stable Respiratory status: spontaneous breathing, respiratory function stable and patient connected to nasal cannula oxygen Cardiovascular status: blood pressure returned to baseline and stable Postop Assessment: no headache, no backache and no apparent nausea or vomiting Anesthetic complications: no   No notable events documented.          Shelton Silvas

## 2021-01-21 NOTE — Lactation Note (Signed)
This note was copied from a baby's chart. Lactation Consultation Note  Patient Name: Girl Laquanna Veazey [id number] Date: 01/21/2021 Reason for consult: Follow-up assessment Age:25 hours LC in to room for follow up. Parents report feeding formula. Mother states her supply has gone down and she only collected 5-mL of colostrum combined during last pumping session.   Reinforced pumping for proper stimulation, fitted a 24-mm flange. Demonstrated hand pump use and collected 4-mL during demonstration.   Discussed normal infant behavior, clusterfeeding, normal output. Talked about milk coming into volume. Parents inquired about foods/supplements to increase breastmilk supply. Urged to use EBM to nipples, air-dry and apply coconut oil.  Feeding plan:  1-Feeding on demand or 8-12 times in 24h period 2-Pump using 24-mm flange every 3 hours   3-Encouraged maternal rest, hydration and food intake.   Contact LC as needed for feeds/support/concerns/questions. All questions answered at this time.    Maternal Data Has patient been taught Hand Expression?: Yes  Feeding Mother's Current Feeding Choice: Breast Milk and Formula Nipple Type: Slow - flow  Lactation Tools Discussed/Used Tools: Pump;Flanges;Coconut oil Flange Size: 24 Breast pump type: Double-Electric Breast Pump;Manual Reason for Pumping: supplementation and stimulation Pumping frequency: Q3 Pumped volume: 5 mL  Interventions Interventions: Breast feeding basics reviewed;Skin to skin;Hand express;Pace feeding;Education;DEBP;Hand pump;Expressed milk;Coconut oil  Discharge Discharge Education: Engorgement and breast care Pump: Manual;DEBP  Consult Status Consult Status: Follow-up Date: 01/22/21 Follow-up type: In-patient    Cote Mayabb A Higuera Ancidey 01/21/2021, 4:05 PM

## 2021-01-21 NOTE — Progress Notes (Addendum)
Post Op Day 1 s/p rLTCS and BTL Subjective: Patient feeling well this morning with no complaints. Reports she is having no pain and minimal vaginal bleeding. Ambulating well and tolerating diet.   Objective: Blood pressure (!) 95/56, pulse (!) 52, temperature 98.2 F (36.8 C), temperature source Oral, resp. rate 19, height 4\' 10"  (1.473 m), weight 87.5 kg, last menstrual period 04/24/2020, SpO2 99 %, unknown if currently breastfeeding.  Physical Exam:  General: alert, cooperative, and no distress Lochia: appropriate Uterine Fundus: firm Incision: healing well, no significant drainage, no dehiscence, no significant erythema DVT Evaluation: No evidence of DVT seen on physical exam.  Recent Labs    01/20/21 1125 01/21/21 0451  HGB 11.4* 9.8*  HCT 36.0 30.8*    Assessment/Plan: Plan for discharge in 1-2 days Continue to monitor BPs BTL done for contraception Breastfeeding IV venofer   LOS: 1 day   01/23/21 01/21/2021, 7:28 AM

## 2021-01-21 NOTE — Progress Notes (Signed)
Pt up and ambulated to the bathroom.  Slow and steady gait.  Denies pain at this time.  Peri care given with instructions.  Pt verbalizes understanding. Back to bed without incident

## 2021-01-22 MED ORDER — OXYCODONE HCL 5 MG PO TABS: 5.0000 mg | ORAL_TABLET | ORAL | 0 refills | Status: DC | PRN

## 2021-01-22 MED ORDER — IBUPROFEN 600 MG PO TABS: 600.0000 mg | ORAL_TABLET | Freq: Four times a day (QID) | ORAL | 0 refills | Status: DC | PRN

## 2021-01-23 ENCOUNTER — Other Ambulatory Visit: Payer: Self-pay

## 2021-01-23 ENCOUNTER — Encounter (HOSPITAL_COMMUNITY): Payer: Self-pay | Admitting: *Deleted

## 2021-01-23 ENCOUNTER — Emergency Department (HOSPITAL_COMMUNITY): Payer: Medicaid Other

## 2021-01-23 ENCOUNTER — Emergency Department (HOSPITAL_COMMUNITY)
Admission: EM | Admit: 2021-01-23 | Discharge: 2021-01-24 | Disposition: A | Discharge status: Home / Self Care | Payer: Medicaid Other | Attending: Emergency Medicine | Admitting: Emergency Medicine

## 2021-01-23 DIAGNOSIS — R0789 Other chest pain: Secondary | ICD-10-CM | POA: Diagnosis not present

## 2021-01-23 DIAGNOSIS — Z5321 Procedure and treatment not carried out due to patient leaving prior to being seen by health care provider: Secondary | ICD-10-CM | POA: Insufficient documentation

## 2021-01-23 DIAGNOSIS — R079 Chest pain, unspecified: Secondary | ICD-10-CM | POA: Diagnosis not present

## 2021-01-23 DIAGNOSIS — N9489 Other specified conditions associated with female genital organs and menstrual cycle: Secondary | ICD-10-CM | POA: Insufficient documentation

## 2021-01-23 DIAGNOSIS — R519 Headache, unspecified: Secondary | ICD-10-CM | POA: Insufficient documentation

## 2021-01-23 LAB — CBC WITH DIFFERENTIAL/PLATELET
Abs Immature Granulocytes: 0.13 10*3/uL — ABNORMAL HIGH (ref 0.00–0.07)
Basophils Absolute: 0 10*3/uL (ref 0.0–0.1)
Basophils Relative: 0 %
Eosinophils Absolute: 0.2 10*3/uL (ref 0.0–0.5)
Eosinophils Relative: 2 %
HCT: 31.8 % — ABNORMAL LOW (ref 36.0–46.0)
Hemoglobin: 10 g/dL — ABNORMAL LOW (ref 12.0–15.0)
Immature Granulocytes: 1 %
Lymphocytes Relative: 18 %
Lymphs Abs: 2 10*3/uL (ref 0.7–4.0)
MCH: 25.1 pg — ABNORMAL LOW (ref 26.0–34.0)
MCHC: 31.4 g/dL (ref 30.0–36.0)
MCV: 79.7 fL — ABNORMAL LOW (ref 80.0–100.0)
Monocytes Absolute: 0.6 10*3/uL (ref 0.1–1.0)
Monocytes Relative: 5 %
Neutro Abs: 7.8 10*3/uL — ABNORMAL HIGH (ref 1.7–7.7)
Neutrophils Relative %: 74 %
Platelets: 308 10*3/uL (ref 150–400)
RBC: 3.99 MIL/uL (ref 3.87–5.11)
RDW: 14.7 % (ref 11.5–15.5)
WBC: 10.7 10*3/uL — ABNORMAL HIGH (ref 4.0–10.5)
nRBC: 0 % (ref 0.0–0.2)

## 2021-01-23 NOTE — ED Triage Notes (Signed)
The pt just had a c-section this past Thursday and a tubal ligation.  Yesterday she developed chest pain and a headache  she had high blood pressure just prior to delkivery  section performed early due to her bo  she is breast feeding

## 2021-01-23 NOTE — ED Provider Notes (Signed)
Emergency Medicine Provider Triage Evaluation Note  Martha Herrera , a 25 y.o. female  was evaluated in triage.  Pt complains of chest pain that began last night. Feels like a heaviness to the anterior chest, no aggravating factors, alleviated some with ibuprofen, has associated dyspnea, nausea, 2 episodes of emesis, and a migraine (Hx of similar headaches). C-section performed by Dr. Ashok Pall, patient reports this was due to hypertension, denies hx of preeclampsia. Denies chest injury, has been trying to breast feed.   Review of Systems  Positive: Chest pain, dyspnea, N/V, headache Negative: Hemoptysis, fever, cough, syncope  Physical Exam  BP 121/78 (BP Location: Right Arm)   Pulse 93   Temp 97.9 F (36.6 C) (Oral)   Resp 17   LMP 04/24/2020   SpO2 100%  Gen:   Awake, no distress   Resp:  Normal effort  MSK:   Moves extremities without difficulty  Other:  Anterior chest wall tenderness to palpation. Heart RRR, lungs CTA.   Medical Decision Making  Medically screening exam initiated at 10:56 PM.  Appropriate orders placed.  [name] was informed that the remainder of the evaluation will be completed by another provider, this initial triage assessment does not replace that evaluation, and the importance of remaining in the ED until their evaluation is complete.  Chest pain.    Desmond Lope 01/23/21 2303    Benjiman Core, MD 01/24/21 [id number]

## 2021-01-24 ENCOUNTER — Encounter: Payer: Self-pay | Admitting: *Deleted

## 2021-01-24 ENCOUNTER — Encounter (HOSPITAL_COMMUNITY)
Admission: RE | Admit: 2021-01-24 | Discharge: 2021-01-24 | Disposition: A | Discharge status: Home / Self Care | Payer: Medicaid Other | Source: Ambulatory Visit | Attending: Obstetrics & Gynecology | Admitting: Obstetrics & Gynecology

## 2021-01-24 ENCOUNTER — Telehealth (HOSPITAL_COMMUNITY): Payer: Self-pay | Admitting: Lactation Services

## 2021-01-24 ENCOUNTER — Encounter: Payer: Medicaid Other | Admitting: Women's Health

## 2021-01-24 ENCOUNTER — Telehealth: Payer: Self-pay | Admitting: *Deleted

## 2021-01-24 LAB — COMPREHENSIVE METABOLIC PANEL
ALT: 32 U/L (ref 0–44)
AST: 30 U/L (ref 15–41)
Albumin: 2.6 g/dL — ABNORMAL LOW (ref 3.5–5.0)
Alkaline Phosphatase: 116 U/L (ref 38–126)
Anion gap: 9 (ref 5–15)
BUN: 10 mg/dL (ref 6–20)
CO2: 24 mmol/L (ref 22–32)
Calcium: 8.8 mg/dL — ABNORMAL LOW (ref 8.9–10.3)
Chloride: 105 mmol/L (ref 98–111)
Creatinine, Ser: 0.66 mg/dL (ref 0.44–1.00)
GFR, Estimated: >60 mL/min (ref >60)
Glucose, Bld: 102 mg/dL — ABNORMAL HIGH (ref 70–99)
Potassium: 3.6 mmol/L (ref 3.5–5.1)
Sodium: 138 mmol/L (ref 135–145)
Total Bilirubin: 0.5 mg/dL (ref 0.3–1.2)
Total Protein: 5.9 g/dL — ABNORMAL LOW (ref 6.5–8.1)

## 2021-01-24 LAB — I-STAT BETA HCG BLOOD, ED (MC, WL, AP ONLY): I-stat hCG, quantitative: 182.7 m[IU]/mL — ABNORMAL HIGH (ref <5)

## 2021-01-24 LAB — SURGICAL PATHOLOGY

## 2021-01-24 LAB — TROPONIN I (HIGH SENSITIVITY): Troponin I (High Sensitivity): 5 ng/L (ref <18)

## 2021-01-24 MED ORDER — ONDANSETRON 4 MG PO TBDP: 4.0000 mg | ORAL_TABLET | Freq: Three times a day (TID) | ORAL | 0 refills | Status: DC | PRN

## 2021-01-24 NOTE — ED Notes (Signed)
Pt stated she was leaving AMA 

## 2021-01-24 NOTE — Telephone Encounter (Signed)
Lengthy phone conversation was had with patient who called with c/o "heaviness on my chest." Patient said she had been directed by her OB to call us & she had been to the ED the day before and had a clear chest XRay. Pt had no difficulty with speaking on the phone & remarked that she had had this feeling when her milk came in with her 1st child. Per patient's description, she is large-breasted.   Pt's breasts are very full, but she has not pumped or fed the baby at breast much b/c "it hurts" to pump and latch infant. Pt says her R nipple is "really cracked" with a scab. Yesterday, the infant fed from her L breast for 30 minutes with considerable softening noted & the chest heaviness was relieved.   This is patient's 2nd baby, but she did not latch her 1st infant at the breast. Pt is likely latching infant with a symmetrical latch technique. Specifics of an asymmetric latch shown via [employer] animation to assist her with latching.   I also explained over the phone how to do hand expression to relieve the discomfort since Mom finds using the hand pump uncomfortable (pt was able to express about 3 oz with only using the hand pump for 5 min on each breast).   I asked Mom to offer the breast using an asymmetrical latch technique & to express her milk (whether using her hands or with a hand pump). The IBCLC in our lactation clinic is not available today, so pt knows she would need to come to [facility] for in-person assistance. Pt knows she can call back with further questions.  Glenetta Hew, RN, IBCLC

## 2021-01-24 NOTE — Telephone Encounter (Signed)
Patient states she feels like her breasts are larger and chest feels heavy.  Denies fever, SOB, body aches or chills.  States she is only using the hand pump as it is painful for her to use the electric pump.   Discussed that pump settings may be set to high for her as it should not be painful and to also call lactation to set up appointment for assistance.  Patient states she is also having some nausea and would like something sent in.  She is not taking any narcotics for pain only ibuprofen but has only had two BM's since delivery.  Will have provider send in nausea medication.

## 2021-01-25 ENCOUNTER — Telehealth: Payer: Self-pay

## 2021-01-25 NOTE — Telephone Encounter (Signed)
Transition Care Management Follow-up Telephone Call Date of discharge and from where: 01/24/2021-[facility] How have you been since you were released from the hospital? [facility]patient stated she is fine, and realized her pain was from milk supply for new baby.  Any questions or concerns? No

## 2021-01-26 ENCOUNTER — Encounter (HOSPITAL_COMMUNITY)
Admission: AD | Disposition: A | Discharge status: Home / Self Care | Payer: Self-pay | Source: Home / Self Care | Attending: Obstetrics and Gynecology

## 2021-01-26 SURGERY — Surgical Case
Anesthesia: Regional

## 2021-01-28 ENCOUNTER — Other Ambulatory Visit: Payer: Self-pay

## 2021-01-28 ENCOUNTER — Encounter: Payer: Self-pay | Admitting: Obstetrics & Gynecology

## 2021-01-28 ENCOUNTER — Ambulatory Visit (INDEPENDENT_AMBULATORY_CARE_PROVIDER_SITE_OTHER): Payer: Medicaid Other | Admitting: Obstetrics & Gynecology

## 2021-01-28 VITALS — BP 134/81 | HR 81 | Ht <= 58 in | Wt 181.0 lb

## 2021-01-28 DIAGNOSIS — Z98891 History of uterine scar from previous surgery: Secondary | ICD-10-CM

## 2021-01-28 NOTE — Progress Notes (Signed)
  HPI: Patient returns for routine postoperative follow-up having undergone repeat C secion + BTL on 9/22.  The patient's immediate postoperative recovery has been unremarkable. Since hospital discharge the patient reports no problems.   Current Outpatient Medications: ibuprofen (ADVIL) 600 MG tablet, Take 1 tablet (600 mg total) by mouth every 6 (six) hours as needed for moderate pain., Disp: 30 tablet, Rfl: 0 ondansetron (ZOFRAN ODT) 4 MG disintegrating tablet, Take 1 tablet (4 mg total) by mouth every 8 (eight) hours as needed for nausea or vomiting., Disp: 20 tablet, Rfl: 0 Prenatal Vit-Fe Fumarate-FA (PRENATAL VITAMIN PO), Take 2 tablets by mouth daily. Gummies, Disp: , Rfl:  oxyCODONE (OXY IR/ROXICODONE) 5 MG immediate release tablet, Take 1 tablet (5 mg total) by mouth every 4 (four) hours as needed for moderate pain. (Patient not taking: Reported on 01/28/2021), Disp: 20 tablet, Rfl: 0  No current facility-administered medications for this visit.    Blood pressure 134/81, pulse 81, height 4\' 10"  (1.473 m), weight 181 lb (82.1 kg), last menstrual period 04/24/2020, currently breastfeeding.  Physical Exam: Incision clean dry intact  Diagnostic Tests:   Pathology:   Impression: 1. S/P cesarean section, BTL      Plan: Routine post op care   Follow up: 5 weeks pp    04/26/2020, MD

## 2021-02-01 ENCOUNTER — Telehealth (HOSPITAL_COMMUNITY): Payer: Self-pay | Admitting: *Deleted

## 2021-02-01 NOTE — Telephone Encounter (Signed)
Patient voiced no questions or concerns regarding her own health. EPDS = 4. Patient voiced no questions or concerns regarding baby at this time. Patient reported infant sleeps in a pack-N-play on her back. RN reviewed ABCs of safe sleep - patient verbalized understanding. Patient requested RN email information on hospital's virtual postpartum classes and support groups. Email sent. Deforest Hoyles, RN, 02/01/21, [phone].

## 2021-02-18 ENCOUNTER — Ambulatory Visit (INDEPENDENT_AMBULATORY_CARE_PROVIDER_SITE_OTHER): Payer: Medicaid Other | Admitting: Obstetrics & Gynecology

## 2021-02-18 ENCOUNTER — Encounter: Payer: Self-pay | Admitting: Obstetrics & Gynecology

## 2021-02-18 ENCOUNTER — Other Ambulatory Visit: Payer: Self-pay

## 2021-02-18 NOTE — Progress Notes (Signed)
POSTPARTUM VISIT Patient name: Martha Herrera MRN [id number]  Date of birth: [birth year] Chief Complaint:   Postpartum Care (C/s and BTL)  History of Present Illness:   Martha Herrera is a 25 y.o. [phone] female being seen today for a postpartum visit. She is 5 weeks postpartum following a repeat cesarean section, low transverse incision with BTL completed 9/22 at 59w5dgestational weeks due to Gestational HTN and prior C-section  Pregnancy complicated by Gestational HTN- no meds .  Last pap smear: 10/2019 neg    Postpartum course has been uncomplicated.  Bleeding no bleeding. Bowel function is normal. Bladder function is normal. Urinary incontinence? No, fecal incontinence? No Patient is not sexually active.   Desired contraception: BTL done PP. Patient does not want a pregnancy in the future.   Desired family size is 2 children.   Upstream - 02/18/21 1209       Pregnancy Intention Screening   Does the patient want to become pregnant in the next year? No    Does the patient's partner want to become pregnant in the next year? No    Would the patient like to discuss contraceptive options today? No      Contraception Wrap Up   Current Method Female Sterilization    End Method Female Sterilization    Contraception Counseling Provided No            The pregnancy intention screening data noted above was reviewed. Potential methods of contraception were discussed. The patient elected to proceed with Female Sterilization.  Edinburgh Postpartum Depression Screening: Negative   Baby's course has been uncomplicated. Baby is feeding by breast: milk supply adequate. Infant has a pediatrician/family doctor? Yes.  Childcare strategy if returning to work/school: no.  Pt has material needs met for her and baby: Yes.    Review of Systems:   Pertinent items are noted in HPI Denies Abnormal vaginal discharge w/ itching/odor/irritation, headaches, visual changes, shortness of breath, chest  pain, abdominal pain, severe nausea/vomiting, or problems with urination or bowel movements. Pertinent History Reviewed:  Reviewed past medical,surgical, obstetrical and family history.  Reviewed problem list, medications and allergies. OB History  Gravida Para Term Preterm AB Living  2 2 2  0 0 2  SAB IAB Ectopic Multiple Live Births  0 0 0 0 2    # Outcome Date GA Lbr Len/2nd Weight Sex Delivery Anes PTL Lv  2 Term 01/20/21 [id number] lb 9.2 oz (3.435 kg) F CS-LTranv Spinal  LIV  1 Term 08/07/18 [id number] lb 7 oz (4.28 kg) M CS-LTranv Spinal  LIV     Birth Comments: elective d/t GDM/fetal macrosomia    Obstetric Comments  Patient had a tubal ligation 01/20/21.   Physical Assessment:   Vitals:   02/18/21 1211  BP: 117/74  Pulse: 68  Weight: 177 lb 6.4 oz (80.5 kg)  Height: 4' 10"  (1.473 m)  Body mass index is 37.08 kg/m.       Physical Examination:   General appearance: alert, well appearing, and in no distress  Mental status: alert, oriented to person, place, and time  Skin: warm & dry   Cardiovascular: normal heart rate noted   Respiratory: normal respiratory effort, no distress   Breasts: deferred, no complaints   Abdomen: soft, non-tender  Incision- well healed, C/D/I  Pelvic: normal external genitalia, vulva, vagina, cervix, uterus and adnexa  Extremities: no edema  Chaperone: AmaCelene Squibb      No  results found for this or any previous visit (from the past 24 hour(s)).  Assessment & Plan:  1) Postpartum exam 2) 5-6 wks s/p repeat cesarean section, low transverse incision and tubal ligation 3) breast feeding 4) Depression screening  Essential components of care per ACOG recommendations:  1.  Mood and well being:  If positive depression screen, discussed and plan developed.  If using tobacco we discussed reduction/cessation and risk of relapse If current substance abuse, we discussed and referral to local resources was offered.   2. Infant care and  feeding:  If breastfeeding, discussed returning to work, pumping, breastfeeding-associated pain, guidance regarding return to fertility while lactating if not using another method Recommended that all caregivers be immunized for flu, pertussis and other preventable communicable diseases  3. Sexuality, contraception and birth spacing Provided guidance regarding sexuality, management of dyspareunia, and resumption of intercourse   4. Sleep and fatigue Discussed coping options for fatigue and sleep disruption Encouraged family/partner/community support of 4 hrs of uninterrupted sleep to help with mood and fatigue  5. Physical recovery  If pt had a C/S, assessed incisional pain and providing guidance on normal vs prolonged recovery If pt had a laceration, perineal healing and pain reviewed.  If urinary or fecal incontinence, discussed management and referred to PT or uro/gyn if indicated  Patient is safe to resume physical activity. Discussed attainment of healthy weight.  6.  Chronic disease management Discussed that due to gestational HTN, potential for HTN in the future- plan to continue to monitor  7. Health maintenance Mammogram at 25yo or earlier if indicated Pap smears as indicated  Meds: No orders of the defined types were placed in this encounter.   Follow-up: Return in about 1 year (around 02/18/2022) for Annual.   No orders of the defined types were placed in this encounter.   Janyth Pupa, DO Attending [place], [facility] for [employer], [place]

## 2021-03-22 DIAGNOSIS — J209 Acute bronchitis, unspecified: Secondary | ICD-10-CM | POA: Diagnosis not present

## 2021-03-22 DIAGNOSIS — J069 Acute upper respiratory infection, unspecified: Secondary | ICD-10-CM | POA: Diagnosis not present

## 2021-03-22 DIAGNOSIS — J019 Acute sinusitis, unspecified: Secondary | ICD-10-CM | POA: Diagnosis not present

## 2021-03-22 DIAGNOSIS — J029 Acute pharyngitis, unspecified: Secondary | ICD-10-CM | POA: Diagnosis not present

## 2021-04-05 DIAGNOSIS — J019 Acute sinusitis, unspecified: Secondary | ICD-10-CM | POA: Diagnosis not present

## 2021-04-05 DIAGNOSIS — J209 Acute bronchitis, unspecified: Secondary | ICD-10-CM | POA: Diagnosis not present

## 2021-06-07 NOTE — Progress Notes (Signed)
Complete physical exam   Patient: Martha Herrera   DOB: July 17, 1995   25 y.o. Female  MRN: [id number] Visit Date: 06/08/2021  Today's healthcare provider: Gwyneth Sprout, FNP   No chief complaint on file.  Subjective    Martha Herrera is a 26 y.o. female who presents today for a complete physical exam.  She reports consuming a general diet. The patient does not participate in regular exercise at present. She generally feels fairly well. She reports sleeping poorly. She does have additional problems to discuss today.  HPI   Past Medical History:  Diagnosis Date   Complication of anesthesia    hard to wake up high level with CS   Gastroesophageal reflux    takes prescribed meds daily   Gestational diabetes    Headache(784.0)    not as often   Heart murmur    Pneumonia 2013   Varicella    Past Surgical History:  Procedure Laterality Date   CESAREAN SECTION N/A 08/07/2018   Procedure: CESAREAN SECTION;  Surgeon: Florian Buff, MD;  Location: [facility];  Service: Obstetrics;  Laterality: N/A;   CESAREAN SECTION N/A 01/20/2021   Procedure: CESAREAN SECTION;  Surgeon: Gwynne Edinger, MD;  Location: [facility];  Service: Obstetrics;  Laterality: N/A;   CESAREAN SECTION WITH BILATERAL TUBAL LIGATION Bilateral 01/20/2021   ESOPHAGOGASTRODUODENOSCOPY  05/03/2012   Procedure: ESOPHAGOGASTRODUODENOSCOPY (EGD);  Surgeon: Oletha Blend, MD;  Location: [facility];  Service: Gastroenterology;  Laterality: N/A;   tubes in ears     WISDOM TOOTH EXTRACTION     Social History   Socioeconomic History   Marital status: Married    Spouse name: Ahria Slappey   Number of children: 1   Years of education: Not on file   Highest education level: Associate degree: academic program  Occupational History   Not on file  Tobacco Use   Smoking status: Never   Smokeless tobacco: Never  Vaping Use   Vaping Use: Never used  Substance and Sexual Activity   Alcohol use: No   Drug use: No   Sexual  activity: Not Currently    Birth control/protection: None  Other Topics Concern   Not on file  Social History Narrative   10th grade   Social Determinants of Health   Financial Resource Strain: Low Risk    Difficulty of Paying Living Expenses: Not hard at all  Food Insecurity: No Food Insecurity   Worried About Charity fundraiser in the Last Year: Never true   [place] in the Last Year: Never true  Transportation Needs: No Transportation Needs   Lack of Transportation (Medical): No   Lack of Transportation (Non-Medical): No  Physical Activity: Sufficiently Active   Days of Exercise per Week: 7 days   Minutes of Exercise per Session: 100 min  Stress: Stress Concern Present   Feeling of Stress : Rather much  Social Connections: Moderately Isolated   Frequency of Communication with Friends and Family: More than three times a week   Frequency of Social Gatherings with Friends and Family: Three times a week   Attends Religious Services: Never   Active Member of Clubs or Organizations: No   Attends Archivist Meetings: Never   Marital Status: Married  Human resources officer Violence: Not At Risk   Fear of Current or Ex-Partner: No   Emotionally Abused: No   Physically Abused: No   Sexually Abused: No  Family Status  Relation Name Status   Father  Alive   Brother 28 yo Alive   MGM  Alive   Brother 80 yo Alive   Mother  Alive   MGF  Deceased at age 47       Lung Cancer   Sister 46 Alive   [place]  Deceased   PGF  Deceased   Mat Aunt  Alive   Pat Aunt  Alive   Mat Uncle  Alive   Pat Uncle x3 Alive   Son  Alive   Neg Hx  (Not Specified)   Family History  Problem Relation Age of Onset   GER disease Father    Cancer Father        kidney   Asthma Brother    Miscarriages / Stillbirths Brother    GER disease Maternal Grandmother    Hyperlipidemia Maternal Grandmother    ADD / ADHD Brother    Miscarriages / Stillbirths Mother    Headache Mother    Cancer  Maternal Grandfather    Diabetes Maternal Grandfather    Hyperlipidemia Maternal Grandfather    Vision loss Maternal Grandfather    GER disease Maternal Aunt    Migraines Maternal Aunt    Anxiety disorder Maternal Aunt    Diabetes Paternal Aunt    Cancer Paternal Uncle        spinal cancer   Hernia Son    Ataxia Neg Hx    Chorea Neg Hx    Dementia Neg Hx    Mental retardation Neg Hx    Multiple sclerosis Neg Hx    Neurofibromatosis Neg Hx    Neuropathy Neg Hx    Parkinsonism Neg Hx    Seizures Neg Hx    Stroke Neg Hx    Depression Neg Hx    Bipolar disorder Neg Hx    Allergies  Allergen Reactions   Penicillins Hives, Itching and Rash    Did it involve swelling of the face/tongue/throat, SOB, or low BP? No Did it involve sudden or severe rash/hives, skin peeling, or any reaction on the inside of your mouth or nose? No Did you need to seek medical attention at a hospital or doctor's office? No When did it last happen?     childhood allergy  If all above answers are "NO", may proceed with cephalosporin use.    Amoxil [Amoxicillin] Itching and Rash    Did it involve swelling of the face/tongue/throat, SOB, or low BP? No Did it involve sudden or severe rash/hives, skin peeling, or any reaction on the inside of your mouth or nose? No Did you need to seek medical attention at a hospital or doctor's office? No When did it last happen?     2019 If all above answers are "NO", may proceed with cephalosporin use.    Patient Care Team: Gwyneth Sprout, FNP as PCP - General (Family Medicine)   Medications: Outpatient Medications Prior to Visit  Medication Sig   loratadine (CLARITIN) 10 MG tablet Take 10 mg by mouth daily.   [DISCONTINUED] Prenatal Vit-Fe Fumarate-FA (PRENATAL VITAMIN PO) Take 2 tablets by mouth daily. Gummies   No facility-administered medications prior to visit.    Review of Systems  Constitutional: Negative.   HENT:  Positive for dental problem, ear pain,  sinus pressure and tinnitus.   Eyes:  Positive for photophobia and pain.  Respiratory: Negative.    Cardiovascular: Negative.   Gastrointestinal:  Positive for abdominal pain, anal bleeding, constipation and  nausea.  Endocrine: Negative.   Genitourinary:  Positive for vaginal pain (only during intercourse).  Musculoskeletal:  Positive for back pain and joint swelling.  Skin: Negative.   Allergic/Immunologic: Positive for environmental allergies.  Neurological:  Positive for headaches.  Hematological: Negative.   Psychiatric/Behavioral:  The patient is nervous/anxious.      Objective    BP 129/73 (BP Location: Right Arm, Patient Position: Sitting, Cuff Size: Normal)    Pulse 98    Temp 98.8 F (37.1 C) (Oral)    Ht 4' 10"  (1.473 m)    Wt 184 lb (83.5 kg)    SpO2 100%    BMI 38.46 kg/m    Physical Exam Vitals and nursing note reviewed.  Constitutional:      General: She is awake. She is not in acute distress.    Appearance: Normal appearance. She is well-developed and well-groomed. She is obese. She is not ill-appearing, toxic-appearing or diaphoretic.  HENT:     Head: Normocephalic and atraumatic.     Jaw: There is normal jaw occlusion. No trismus, tenderness, swelling or pain on movement.     Right Ear: Hearing, tympanic membrane, ear canal and external ear normal. There is no impacted cerumen.     Left Ear: Hearing, tympanic membrane, ear canal and external ear normal. There is no impacted cerumen.     Nose: Nose normal. No congestion or rhinorrhea.     Right Turbinates: Not enlarged, swollen or pale.     Left Turbinates: Not enlarged, swollen or pale.     Right Sinus: No maxillary sinus tenderness or frontal sinus tenderness.     Left Sinus: No maxillary sinus tenderness or frontal sinus tenderness.     Mouth/Throat:     Lips: Pink.     Mouth: Mucous membranes are moist. No injury.     Tongue: No lesions.     Pharynx: Oropharynx is clear. Uvula midline. No pharyngeal  swelling, oropharyngeal exudate, posterior oropharyngeal erythema or uvula swelling.     Tonsils: No tonsillar exudate or tonsillar abscesses.  Eyes:     General: Lids are normal. Lids are everted, no foreign bodies appreciated. Vision grossly intact. Gaze aligned appropriately. No allergic shiner or visual field deficit.       Right eye: No discharge.        Left eye: No discharge.     Extraocular Movements: Extraocular movements intact.     Conjunctiva/sclera: Conjunctivae normal.     Right eye: Right conjunctiva is not injected. No exudate.    Left eye: Left conjunctiva is not injected. No exudate.    Pupils: Pupils are equal, round, and reactive to light.  Neck:     Thyroid: No thyroid mass, thyromegaly or thyroid tenderness.     Vascular: No carotid bruit.     Trachea: Trachea normal.  Cardiovascular:     Rate and Rhythm: Normal rate and regular rhythm.     Pulses: Normal pulses.          Carotid pulses are 2+ on the right side and 2+ on the left side.      Radial pulses are 2+ on the right side and 2+ on the left side.       Dorsalis pedis pulses are 2+ on the right side and 2+ on the left side.       Posterior tibial pulses are 2+ on the right side and 2+ on the left side.     Heart sounds: Normal heart sounds,  S1 normal and S2 normal. No murmur heard.   No friction rub. No gallop.  Pulmonary:     Effort: Pulmonary effort is normal. No respiratory distress.     Breath sounds: Normal breath sounds and air entry. No stridor. No wheezing, rhonchi or rales.  Chest:     Chest wall: No tenderness.     Comments: Breast exam deferred; discussed self exam Abdominal:     General: Abdomen is flat. Bowel sounds are normal. There is no distension.     Palpations: Abdomen is soft. There is no mass.     Tenderness: There is no abdominal tenderness. There is no right CVA tenderness, left CVA tenderness, guarding or rebound.     Hernia: No hernia is present.  Genitourinary:    Comments:  Exam deferred; denies complaints Musculoskeletal:        General: No swelling, tenderness, deformity or signs of injury. Normal range of motion.     Cervical back: Full passive range of motion without pain, normal range of motion and neck supple. No edema, rigidity or tenderness. No muscular tenderness.     Right lower leg: No edema.     Left lower leg: No edema.  Lymphadenopathy:     Cervical: No cervical adenopathy.     Right cervical: No superficial, deep or posterior cervical adenopathy.    Left cervical: No superficial, deep or posterior cervical adenopathy.  Skin:    General: Skin is warm and dry.     Capillary Refill: Capillary refill takes less than 2 seconds.     Coloration: Skin is not jaundiced or pale.     Findings: No bruising, erythema, lesion or rash.  Neurological:     General: No focal deficit present.     Mental Status: She is alert and oriented to person, place, and time. Mental status is at baseline.     GCS: GCS eye subscore is 4. GCS verbal subscore is 5. GCS motor subscore is 6.     Sensory: Sensation is intact. No sensory deficit.     Motor: Motor function is intact. No weakness.     Coordination: Coordination is intact. Coordination normal.     Gait: Gait is intact. Gait normal.  Psychiatric:        Attention and Perception: Attention and perception normal.        Mood and Affect: Affect normal. Mood is anxious and depressed.        Speech: Speech normal.        Behavior: Behavior normal. Behavior is cooperative.        Thought Content: Thought content normal.        Cognition and Memory: Cognition and memory normal.        Judgment: Judgment normal.     Last depression screening scores PHQ 2/9 Scores 06/08/2021 11/04/2020 07/19/2020  PHQ - 2 Score 4 0 0  PHQ- 9 Score 14 4 0   Last fall risk screening Fall Risk  06/08/2021  Falls in the past year? 0  Number falls in past yr: -  Injury with Fall? -  Comment -  Risk for fall due to : -  Follow up -    Last Audit-C alcohol use screening Alcohol Use Disorder Test (AUDIT) 06/08/2021  1. How often do you have a drink containing alcohol? 1  2. How many drinks containing alcohol do you have on a typical day when you are drinking? 0  3. How often do you have six  or more drinks on one occasion? 0  AUDIT-C Score 1  Alcohol Brief Interventions/Follow-up -   A score of 3 or more in women, and 4 or more in men indicates increased risk for alcohol abuse, EXCEPT if all of the points are from question 1   No results found for any visits on 06/08/21.  Assessment & Plan    Routine Health Maintenance and Physical Exam  Exercise Activities and Dietary recommendations  Goals   None     Immunization History  Administered Date(s) Administered   DTaP 11/07/1995, 03/10/1996, 04/15/1996, 03/31/1997, 06/29/2000   HPV Quadrivalent 09/12/2006, 12/14/2006, 03/05/2007   Hepatitis A, Ped/Adol-2 Dose 09/12/2006, 03/05/2007, 03/14/2013   Hepatitis B, ped/adol 1996/04/22, 11/07/1995, 04/15/1996   IPV 11/07/1995, 03/10/1996, 04/15/1996, 06/29/2000   Influenza Nasal 02/05/2009   Influenza,inj,Quad PF,6+ Mos 01/18/2018, 01/11/2021   MMR 09/03/1996, 06/29/2000, 08/10/2018   Meningococcal Conjugate 09/12/2006   Meningococcal Polysaccharide 11/30/2011   PFIZER(Purple Top)SARS-COV-2 Vaccination 11/11/2019   Td 09/12/2006   Tdap 09/12/2006, 04/04/2017, 05/10/2018, 11/04/2020    Health Maintenance  Topic Date Due   COVID-19 Vaccine (2 - Pfizer risk series) 06/24/2021 (Originally 12/02/2019)   PAP-Cervical Cytology Screening  11/11/2022   PAP SMEAR-Modifier  11/11/2022   TETANUS/TDAP  11/05/2030   INFLUENZA VACCINE  Completed   HPV VACCINES  Completed   Hepatitis C Screening  Completed   HIV Screening  Completed    Discussed health benefits of physical activity, and encouraged her to engage in regular exercise appropriate for her age and condition.  Problem List Items Addressed This Visit        Digestive   Gastroesophageal reflux disease with esophagitis without hemorrhage    Continue PPI Recommend food journal and weight loss to assist with symptom mgmt      Relevant Medications   omeprazole (PRILOSEC) 40 MG capsule   Slow transit constipation    Encourage use of miralax to assist Reports occasional bleeding on wipe, likely strain related hemorrhoid       Relevant Orders   CBC with Differential/Platelet     Nervous and Auditory   Chronic tension-type headache, intractable    Journal paper provided to track symptoms      Relevant Medications   FLUoxetine (PROZAC) 40 MG capsule     Other   History of gestational diabetes    Repeat A1c      Relevant Orders   Hemoglobin A1c   Annual physical exam - Primary    UTD on dental and vision Things to do to keep yourself healthy  - Exercise at least 30-45 minutes a day, 3-4 days a week.  - Eat a low-fat diet with lots of fruits and vegetables, up to 7-9 servings per day.  - Seatbelts can save your life. Wear them always.  - Smoke detectors on every level of your home, check batteries every year.  - Eye Doctor - have an eye exam every 1-2 years  - Safe sex - if you may be exposed to STDs, use a condom.  - Alcohol -  If you drink, do it moderately, less than 2 drinks per day.  - [place]. Choose someone to speak for you if you are not able.  - Depression is common in our stressful world.If you're feeling down or losing interest in things you normally enjoy, please come in for a visit.  - Violence - If anyone is threatening or hurting you, please call immediately.  Relevant Orders   Comprehensive metabolic panel   Anxiety and depression    Worsening since second child 6 months ago; start SSRI Will Start Prozac 40 mg daily Discussed potential side effects, incl GI upset, sexual dysfunction, increased anxiety, and SI Discussed that it can take 6-8 weeks to reach full efficacy Contracted for  safety - no SI/HI Discussed synergistic effects of medications and therapy       Relevant Medications   FLUoxetine (PROZAC) 40 MG capsule   Other Relevant Orders   AMB Referral to [facility]   Other Visit Diagnoses     Iron deficiency anemia secondary to inadequate dietary iron intake       Relevant Orders   CBC with Differential/Platelet   Iron, TIBC and Ferritin Panel        Return in about 2 months (around 08/06/2021) for anxiety and depression.     Vonna Kotyk, FNP, have reviewed all documentation for this visit. The documentation on 06/08/21 for the exam, diagnosis, procedures, and orders are all accurate and complete.    Argentina Ponder De[place],acting as a scribe for Gwyneth Sprout, FNP.,have documented all relevant documentation on the behalf of Gwyneth Sprout, FNP,as directed by  Gwyneth Sprout, FNP while in the presence of Gwyneth Sprout, FNP.   Gwyneth Sprout, [place] [phone] (phone) [phone] (fax)  [place]

## 2021-06-08 ENCOUNTER — Encounter: Payer: Self-pay | Admitting: Family Medicine

## 2021-06-08 ENCOUNTER — Other Ambulatory Visit: Payer: Self-pay

## 2021-06-08 ENCOUNTER — Ambulatory Visit: Payer: Medicaid Other | Admitting: Family Medicine

## 2021-06-08 VITALS — BP 129/73 | HR 98 | Temp 98.8°F | Ht <= 58 in | Wt 184.0 lb

## 2021-06-08 DIAGNOSIS — F32A Depression, unspecified: Secondary | ICD-10-CM

## 2021-06-08 DIAGNOSIS — F419 Anxiety disorder, unspecified: Secondary | ICD-10-CM | POA: Diagnosis not present

## 2021-06-08 DIAGNOSIS — Z0000 Encounter for general adult medical examination without abnormal findings: Secondary | ICD-10-CM | POA: Diagnosis not present

## 2021-06-08 DIAGNOSIS — G44221 Chronic tension-type headache, intractable: Secondary | ICD-10-CM | POA: Diagnosis not present

## 2021-06-08 DIAGNOSIS — Z8632 Personal history of gestational diabetes: Secondary | ICD-10-CM

## 2021-06-08 DIAGNOSIS — K2100 Gastro-esophageal reflux disease with esophagitis, without bleeding: Secondary | ICD-10-CM

## 2021-06-08 DIAGNOSIS — D508 Other iron deficiency anemias: Secondary | ICD-10-CM

## 2021-06-08 DIAGNOSIS — K5901 Slow transit constipation: Secondary | ICD-10-CM

## 2021-06-08 MED ORDER — FLUOXETINE HCL 40 MG PO CAPS: 40.0000 mg | ORAL_CAPSULE | Freq: Every day | ORAL | 3 refills | Status: DC

## 2021-06-08 MED ORDER — OMEPRAZOLE 40 MG PO CPDR: 40.0000 mg | DELAYED_RELEASE_CAPSULE | Freq: Every day | ORAL | 3 refills | Status: DC

## 2021-06-08 NOTE — Assessment & Plan Note (Signed)
Worsening since second child 6 months ago; start SSRI Will Start Prozac 40 mg daily Discussed potential side effects, incl GI upset, sexual dysfunction, increased anxiety, and SI Discussed that it can take 6-8 weeks to reach full efficacy Contracted for safety - no SI/HI Discussed synergistic effects of medications and therapy

## 2021-06-08 NOTE — Assessment & Plan Note (Signed)
UTD on dental and vision Things to do to keep yourself healthy  - Exercise at least 30-45 minutes a day, 3-4 days a week.  - Eat a low-fat diet with lots of fruits and vegetables, up to 7-9 servings per day.  - Seatbelts can save your life. Wear them always.  - Smoke detectors on every level of your home, check batteries every year.  - Eye Doctor - have an eye exam every 1-2 years  - Safe sex - if you may be exposed to STDs, use a condom.  - Alcohol -  If you drink, do it moderately, less than 2 drinks per day.  - Health Care Power of Attorney. Choose someone to speak for you if you are not able.  - Depression is common in our stressful world.If you're feeling down or losing interest in things you normally enjoy, please come in for a visit.  - Violence - If anyone is threatening or hurting you, please call immediately.  

## 2021-06-08 NOTE — Assessment & Plan Note (Signed)
Encourage use of miralax to assist Reports occasional bleeding on wipe, likely strain related hemorrhoid

## 2021-06-08 NOTE — Assessment & Plan Note (Signed)
Repeat A1c 

## 2021-06-08 NOTE — Assessment & Plan Note (Signed)
Continue PPI Recommend food journal and weight loss to assist with symptom mgmt

## 2021-06-08 NOTE — Assessment & Plan Note (Signed)
Journal paper provided to track symptoms

## 2021-06-09 ENCOUNTER — Telehealth: Payer: Self-pay

## 2021-06-09 DIAGNOSIS — D508 Other iron deficiency anemias: Secondary | ICD-10-CM | POA: Insufficient documentation

## 2021-06-09 LAB — COMPREHENSIVE METABOLIC PANEL
ALT: 19 IU/L (ref 0–32)
AST: 17 IU/L (ref 0–40)
Albumin/Globulin Ratio: 1.7 (ref 1.2–2.2)
Albumin: 4.8 g/dL (ref 3.9–5.0)
Alkaline Phosphatase: 104 IU/L (ref 44–121)
BUN/Creatinine Ratio: 25 — ABNORMAL HIGH (ref 9–23)
BUN: 20 mg/dL (ref 6–20)
Bilirubin Total: 0.6 mg/dL (ref 0.0–1.2)
CO2: 24 mmol/L (ref 20–29)
Calcium: 9.1 mg/dL (ref 8.7–10.2)
Chloride: 101 mmol/L (ref 96–106)
Creatinine, Ser: 0.81 mg/dL (ref 0.57–1.00)
Globulin, Total: 2.8 g/dL (ref 1.5–4.5)
Glucose: 97 mg/dL (ref 70–99)
Potassium: 3.8 mmol/L (ref 3.5–5.2)
Sodium: 139 mmol/L (ref 134–144)
Total Protein: 7.6 g/dL (ref 6.0–8.5)
eGFR: 103 mL/min/{1.73_m2} (ref >59)

## 2021-06-09 LAB — CBC WITH DIFFERENTIAL/PLATELET
Basophils Absolute: 0 10*3/uL (ref 0.0–0.2)
Basos: 0 %
EOS (ABSOLUTE): 0 10*3/uL (ref 0.0–0.4)
Eos: 0 %
Hematocrit: 44.6 % (ref 34.0–46.6)
Hemoglobin: 14.7 g/dL (ref 11.1–15.9)
Immature Grans (Abs): 0 10*3/uL (ref 0.0–0.1)
Immature Granulocytes: 0 %
Lymphocytes Absolute: 1.1 10*3/uL (ref 0.7–3.1)
Lymphs: 11 %
MCH: 27 pg (ref 26.6–33.0)
MCHC: 33 g/dL (ref 31.5–35.7)
MCV: 82 fL (ref 79–97)
Monocytes Absolute: 0.4 10*3/uL (ref 0.1–0.9)
Monocytes: 4 %
Neutrophils Absolute: 7.9 10*3/uL — ABNORMAL HIGH (ref 1.4–7.0)
Neutrophils: 85 %
Platelets: 298 10*3/uL (ref 150–450)
RBC: 5.44 x10E6/uL — ABNORMAL HIGH (ref 3.77–5.28)
RDW: 13.4 % (ref 11.7–15.4)
WBC: 9.4 10*3/uL (ref 3.4–10.8)

## 2021-06-09 LAB — IRON,TIBC AND FERRITIN PANEL
Ferritin: 38 ng/mL (ref 15–150)
Iron Saturation: 19 % (ref 15–55)
Iron: 65 ug/dL (ref 27–159)
Total Iron Binding Capacity: 337 ug/dL (ref 250–450)
UIBC: 272 ug/dL (ref 131–425)

## 2021-06-09 LAB — HEMOGLOBIN A1C
Est. average glucose Bld gHb Est-mCnc: 105 mg/dL
Hgb A1c MFr Bld: 5.3 % (ref 4.8–5.6)

## 2021-06-09 NOTE — Telephone Encounter (Signed)
Paatient has since seen her results

## 2021-06-09 NOTE — Telephone Encounter (Signed)
Copied from CRM [id number]. Topic: General - Inquiry >> Jun 09, 2021  8:56 AM Traci Sermon wrote: Reason for CRM: Pt called in wanting someone to read her results. Please advise

## 2021-06-15 ENCOUNTER — Other Ambulatory Visit: Payer: Self-pay | Admitting: *Deleted

## 2021-06-15 NOTE — Patient Instructions (Signed)
Visit Information  Martha Herrera was given information about Medicaid Managed Care team care coordination services as a part of their Healthy [facility] Medicaid benefit. Martha Herrera verbally consented to engagement with the [facility] Managed Care team.   If you are experiencing a medical emergency, please call 911 or report to your local emergency department or urgent care.   If you have a non-emergency medical problem during routine business hours, please contact your provider's office and ask to speak with a nurse.   For questions related to your Healthy [facility] health plan, please call: [phone] or visit the homepage here: [email or web address]  If you would like to schedule transportation through your Healthy [facility] plan, please call the following number at least 2 days in advance of your appointment: [phone]  Call the [facility] Crisis Line at [phone], at any time, 24 hours a day, 7 days a week. If you are in danger or need immediate medical attention call 911.  If you would like help to quit smoking, call 1-800-QUIT-NOW (219 133 3014) OR Espaol: 1-855-Djelo-Ya (5-329-924-2683) o para ms informacin haga clic aqu or Text READY to 419-622 to register via text  Martha Herrera - following are the goals we discussed in your visit today:   Goals Addressed               This Visit's Progress     Reduce signs and symptoms of depression and anxiety (pt-stated)        Timeframe:  Long-Range Goal Priority:  High Start Date:  06/15/21                           Expected End Date:            09/27/21           Follow Up Date 06/28/21/     - check out counseling - keep 90 percent of counseling appointments - schedule counseling appointment -consider taking the medication prescribed to help with anxiety -call 988 for mental health hotline/crisis line if needed (24/7 available) -try techniques to reduce symptoms of anxiety (deep  breathing, distraction, positive self talk, etc)    Why is this important?   Beating depression may take some time.  If you don't feel better right away, don't give up on your treatment plan.    Notes:            Patient verbalizes understanding of instructions and care plan provided today and agrees to view in MyChart. Active MyChart status confirmed with patient.      Martha Herrera MSW, LCSW Licensed Clinical Social Worker Managed Medicaid Coverage   [id number]   Following is a copy of your plan of care:  Care Plan : LCSW Plan of Care  Updates made by Buck Mam, LCSW since 06/15/2021 12:00 AM     Problem: Anxiety and Depression   Priority: High     Long-Range Goal: Reduce Depression and Anxiety through treatment plan (RX, counseling)   Start Date: 06/15/2021  Expected End Date: 09/12/2021  This Visit's Progress: On track  Priority: High  Note:    Current Barriers:  Mental Health Concerns    CSW Clinical Goal(s):  Patient  demonstrate a reduction in symptoms related to :Anxiety with Excessive Worry,  through collaboration with Clinical Social Worker, provider, and care team.   Interventions: CSW made contact with pt today to complete initial assessment and to provide support. 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  She denies any history of mental health diagnosis with no history of hospitalizations, medication treatment or counseling. Pt is open to seeking help to reduce her symptoms of anxiety (panic attack) and depression. CSW completed PHQ9 Depression Screening with pt; scoring less/better than last week with an "11".  Depression screen [facility] 2/9 06/15/2021 06/08/2021 11/04/2020 07/19/2020 06/08/2020  Decreased Interest 0 1 0 0 0  Down, Depressed, Hopeless 3 3 0 0 0  PHQ - 2 Score 3 4 0 0 0  Altered sleeping 3 3 3  0 3  Tired, decreased energy 3 3 1  0 3  Change in appetite 0 1 0 0 0  Feeling bad or failure about yourself   2 3 0 0 0  Trouble concentrating 0 0 0 0 0  Moving slowly or fidgety/restless 0 0 0 0 0  Suicidal thoughts 0 0 0 0 0  PHQ-9 Score 11 14 4  0 6  Difficult doing work/chores Somewhat difficult Very difficult - - Not difficult at all  Some recent data might be hidden     Pt also reports she has good support from her husband; stating, "he is my rock, my everything". She has not tried the RX prescribed by Provider yet- admitting "it gives me anxiety to think about taking the medicine for anxiety".  CSW validated her fear and offered reassurance. CSW suggested she take it over the weekend so that her husband is around and can help with the kids,etc if she finds side effects bother her. CSW educated pt on taking the RX as prescribed and to not abruptly stop taking it either.  Pt denies SI/HI. She is hoping to find a job soon as she feels this will help her to feel better as well.  Pt provided with contact # to call CSW if needs arise. Also provided the "988" hotline number to call for mental health support.   1:1 collaboration with primary care provider regarding development and update of comprehensive plan of care as evidenced by provider attestation and co-signature Inter-disciplinary care team collaboration (see longitudinal plan of care) Evaluation of current treatment plan related to  self management and patient's adherence to plan as established by provider Review resources, discussed options and provided patient information about  Mental health counseling support   Mental Health:  (Status: New goal.) Evaluation of current treatment plan related to Anxiety with Panic Symptoms, Depression screen reviewed  PHQ2/ PHQ9 completed Mindfulness or Relaxation training provided Active listening / Reflection utilized  Problem Solving /Task Center strategies reviewed Provided psychoeducation for mental health needs  Reviewed mental health medications and discussed importance of taking   Participation  in counseling encouraged   Patient Self-Care Activities: CSW to call you with information for you to schedule your counseling appointment. Begin and continue with compliance of taking medication (Prozac) I have placed Advance Directive paperwork in mail for you to complete.   check out counseling - keep 90 percent of counseling appointments - schedule counseling appointment -consider taking the medication prescribed to help with anxiety -call 988 for mental health hotline/crisis line if needed (24/7 available) -try techniques to reduce symptoms of anxiety (deep breathing, distraction, positive self talk, etc)

## 2021-06-15 NOTE — Patient Outreach (Signed)
Medicaid Managed Care Social Work Note  06/15/2021 Name:  Martha Herrera MRN:  [id number] DOB:  21-Oct-1995  Martha Herrera is an 26 y.o. year old female who is a primary patient of [name], FNP.  The Medicaid Managed Care Coordination team was consulted for assistance with:  Mental Health Counseling and Resources  Martha Herrera was given information about Medicaid Managed Care Coordination team services today. Martha Herrera Patient agreed to services and verbal consent obtained.  Engaged with patient  for by telephone forinitial visit in response to referral for case management and/or care coordination services.   Assessments/Interventions:  Review of past medical history, allergies, medications, health status, including review of consultants reports, laboratory and other test data, was performed as part of comprehensive evaluation and provision of chronic care management services.  SDOH: (Social Determinant of Health) assessments and interventions performed: SDOH Interventions    Flowsheet Row Most Recent Value  SDOH Interventions   Food Insecurity Interventions Intervention Not Indicated  Stress Interventions Provide Counseling  Transportation Interventions Intervention Not Indicated  Depression Interventions/Treatment  Medication, Counseling       Advanced Directives Status:  See Care Plan for related entries. and Not addressed in this encounter.  Care Plan                 Allergies  Allergen Reactions   Penicillins Hives, Itching and Rash    Did it involve swelling of the face/tongue/throat, SOB, or low BP? No Did it involve sudden or severe rash/hives, skin peeling, or any reaction on the inside of your mouth or nose? No Did you need to seek medical attention at a hospital or doctor's office? No When did it last happen?     childhood allergy  If all above answers are "NO", may proceed with cephalosporin use.    Amoxil [Amoxicillin] Itching and Rash    Did it involve  swelling of the face/tongue/throat, SOB, or low BP? No Did it involve sudden or severe rash/hives, skin peeling, or any reaction on the inside of your mouth or nose? No Did you need to seek medical attention at a hospital or doctor's office? No When did it last happen?     2019 If all above answers are "NO", may proceed with cephalosporin use.    Medications Reviewed Today     Reviewed by Buck Mam, LCSW (Social Worker) on 06/15/21 at 1454  Med List Status: <None>   Medication Order Taking? Sig Documenting Provider Last Dose Status Informant  FLUoxetine (PROZAC) 40 MG capsule [id number] No Take 1 capsule (40 mg total) by mouth daily.  Patient not taking: Reported on 06/15/2021   [name], FNP Not Taking Active   loratadine (CLARITIN) 10 MG tablet [id number]  Take 10 mg by mouth daily. [provider]  Active   omeprazole (PRILOSEC) 40 MG capsule [id number]  Take 1 capsule (40 mg total) by mouth daily. Merita Norton T, FNP  Active             Patient Active Problem List   Diagnosis Date Noted   Iron deficiency anemia secondary to inadequate dietary iron intake 06/09/2021   Annual physical exam 06/08/2021   Gastroesophageal reflux disease with esophagitis without hemorrhage 06/08/2021   Anxiety and depression 06/08/2021   Chronic tension-type headache, intractable 06/08/2021   Slow transit constipation 06/08/2021   Gestational hypertension 01/20/2021   History of bilateral tubal ligation 01/20/2021   S/P cesarean section 08/07/2018   History  of gestational diabetes 05/13/2018   Tension headache 01/13/2013   Migraine without aura 01/13/2013   Cardiac murmur 07/25/2012   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   Substernal chest pain 03/12/2012   Gastroesophageal reflux     Conditions to be addressed/monitored per PCP order:  Anxiety and Depression  Care Plan : LCSW Plan of Care  Updates made by Buck Mam, LCSW since 06/15/2021 12:00 AM     Problem:  Anxiety and Depression   Priority: High     Long-Range Goal: Reduce Depression and Anxiety through treatment plan (RX, counseling)   Start Date: 06/15/2021  Expected End Date: 09/12/2021  This Visit's Progress: On track  Priority: High  Note:    Current Barriers:  Mental Health Concerns    CSW Clinical Goal(s):  Patient  demonstrate a reduction in symptoms related to :Anxiety with Excessive Worry,  through collaboration with Clinical Social Worker, provider, and care team.   Interventions: CSW made contact with pt today to complete initial assessment and to provide support. Pt acknowledges she had PPD after the birth of her 2nd child (now 76 months old).  She denies any history of mental health diagnosis with no history of hospitalizations, medication treatment or counseling. Pt is open to seeking help to reduce her symptoms of anxiety (panic attack) and depression. CSW completed PHQ9 Depression Screening with pt; scoring less/better than last week with an "11".  Depression screen [facility] 2/9 06/15/2021 06/08/2021 11/04/2020 07/19/2020 06/08/2020  Decreased Interest 0 1 0 0 0  Down, Depressed, Hopeless 3 3 0 0 0  PHQ - 2 Score 3 4 0 0 0  Altered sleeping 3 3 3  0 3  Tired, decreased energy 3 3 1  0 3  Change in appetite 0 1 0 0 0  Feeling bad or failure about yourself  2 3 0 0 0  Trouble concentrating 0 0 0 0 0  Moving slowly or fidgety/restless 0 0 0 0 0  Suicidal thoughts 0 0 0 0 0  PHQ-9 Score 11 14 4  0 6  Difficult doing work/chores Somewhat difficult Very difficult - - Not difficult at all  Some recent data might be hidden     Pt also reports she has good support from her husband; stating, "he is my rock, my everything". She has not tried the RX prescribed by Provider yet- admitting "it gives me anxiety to think about taking the medicine for anxiety".  CSW validated her fear and offered reassurance. CSW suggested she take it over the weekend so that her husband is around and can help with the  kids,etc if she finds side effects bother her. CSW educated pt on taking the RX as prescribed and to not abruptly stop taking it either.  Pt denies SI/HI. She is hoping to find a job soon as she feels this will help her to feel better as well.  Pt provided with contact # to call CSW if needs arise. Also provided the "988" hotline number to call for mental health support.   1:1 collaboration with primary care provider regarding development and update of comprehensive plan of care as evidenced by provider attestation and co-signature Inter-disciplinary care team collaboration (see longitudinal plan of care) Evaluation of current treatment plan related to  self management and patient's adherence to plan as established by provider Review resources, discussed options and provided patient information about  Mental health counseling support   Mental Health:  (Status: New goal.) Evaluation of current treatment plan related to Anxiety  with Panic Symptoms, Depression screen reviewed  PHQ2/ PHQ9 completed Mindfulness or Relaxation training provided Active listening / Reflection utilized  Problem Solving /Task Center strategies reviewed Provided psychoeducation for mental health needs  Reviewed mental health medications and discussed importance of taking   Participation in counseling encouraged   Patient Self-Care Activities: CSW to call you with information for you to schedule your counseling appointment. Begin and continue with compliance of taking medication (Prozac) I have placed Advance Directive paperwork in mail for you to complete.   check out counseling - keep 90 percent of counseling appointments - schedule counseling appointment -consider taking the medication prescribed to help with anxiety -call 988 for mental health hotline/crisis line if needed (24/7 available) -try techniques to reduce symptoms of anxiety (deep breathing, distraction, positive self talk, etc)            Follow up:  Patient agrees to Care Plan and Follow-up.  Plan: The Managed Medicaid care management team will reach out to the patient again over the next 20 days.  Date/time of next scheduled Social Work care management/care coordination outreach:  06/28/21 Reece Levy MSW, LCSW Licensed Clinical Social Worker Managed Medicaid Coverage   [phone]

## 2021-07-13 ENCOUNTER — Ambulatory Visit: Payer: Self-pay

## 2021-07-13 ENCOUNTER — Telehealth: Payer: Self-pay | Admitting: *Deleted

## 2021-07-13 NOTE — Patient Outreach (Signed)
Care Coordination ? ?07/13/2021 ? ?[name] ?[birth year] ?[id number] ? ? ?Care Management  ? ?Follow Up Note ? ? ?07/13/2021 ?Name: Martha Herrera MRN: [id number] DOB: 1996-04-26 ? ? ?Referred by: [name], FNP ?Reason for referral : No chief complaint on file. ? ? ?A second unsuccessful telephone outreach was attempted today. The patient was referred to the case management team for assistance with care management and care coordination.  ? ?Follow Up Plan: The care management team will reach out to the patient again over the next 10 days.  ? ?Reece Levy MSW, LCSW ?Licensed Clinical Social Worker ?Managed Medicaid Coverage   ?[phone]  ? ?

## 2021-07-13 NOTE — Patient Outreach (Signed)
Care Coordination ? ?07/13/2021 ? ?[name] ?[birth year] ?[id number] ? ? ?Care Management  ?Late Entry for 06/28/21 ? ?07/13/2021 ?Name: Martha Herrera MRN: [id number] DOB: [birth year] ? ? ?Referred by: [name], FNP ?Reason for referral : No chief complaint on file. ? ? ?An unsuccessful telephone outreach was attempted today. The patient was referred to the case management team for assistance with care management and care coordination.  ? ?Follow Up Plan: The care management team will reach out to the patient again over the next 30 days.  ? ?Reece Levy MSW, LCSW ?Licensed Clinical Social Worker ?Managed Medicaid Coverage   ?[phone]  ?

## 2021-07-14 ENCOUNTER — Telehealth: Payer: Self-pay | Admitting: Family Medicine

## 2021-07-14 NOTE — Telephone Encounter (Signed)
.. ?  Medicaid Managed Care  ? ?Unsuccessful Outreach Note ? ?07/14/2021 ?Name: Martha Herrera MRN: [id number] DOB: 12/08/1995 ? ?Referred by: [name], FNP ?Reason for referral : High Risk Managed Medicaid (I called the patient today to get her rescheduled with the MM LCSW. I left my name and number on her VM.) ? ? ?Third unsuccessful telephone outreach was attempted today. The patient was referred to the case management team for assistance with care management and care coordination. The patient's primary care provider has been notified of our unsuccessful attempts to make or maintain contact with the patient. The care management team is pleased to engage with this patient at any time in the future should he/she be interested in assistance from the care management team.  ? ?Follow Up Plan: The care management team will reach out to the patient again over the next 7 days.  ? ? ?Weston Settle ?Care Guide, High Risk Medicaid Managed Care ?Embedded Care Coordination ?  [facility]  ? ? ?  ?

## 2021-07-15 ENCOUNTER — Ambulatory Visit: Payer: Medicaid Other

## 2021-07-20 ENCOUNTER — Telehealth: Payer: Self-pay | Admitting: *Deleted

## 2021-07-20 NOTE — Patient Outreach (Signed)
[facility] Customer service manager [facility]) Care Management ? ?07/20/2021 ? ?[name] ?06-13-1995 ?[id number] ? ? ? ?Care Management  ? ?Follow Up Note ? ? ?07/20/2021 ?Name: Martha Herrera MRN: [id number] DOB: 07/10/1995 ? ? ?Referred by: [name], FNP ?Reason for referral : No chief complaint on file. ? ? ?A second unsuccessful telephone outreach was attempted today. The patient was referred to the case management team for assistance with care management and care coordination.  ? ?Follow Up Plan: Telephone follow up appointment with care management team member scheduled for:07/25/21 ? ?Reece Levy MSW, LCSW ?Licensed Clinical Social Worker ?Managed Medicaid Coverage   ?[phone]  ?

## 2021-07-25 ENCOUNTER — Other Ambulatory Visit: Payer: Self-pay | Admitting: *Deleted

## 2021-07-25 NOTE — Patient Instructions (Signed)
[name] ,  ? ?The [facility] Managed Care Team is available to provide assistance to you with your healthcare needs at no cost and as a benefit of your [facility] Health plan. We have been unable to reach you on 3 separate attempts. The care management team is available to assist with your healthcare needs at any time. Please do not hesitate to contact me at the number below. .  ? ?Thank you,  ?Dickie La, BSW, MSW, LCSW ?Managed Medicaid LCSW ?[place]  [facility] ?[email or web address] ?Phone: [phone] ? ? ?

## 2021-07-25 NOTE — Patient Outreach (Signed)
[facility] Customer service manager [facility]) Care Management ? ?07/25/2021 ? ?Martha Herrera ?1995/10/13 ?[id number] ? ? ?LCSW completed fourth [facility] outreach attempt today during scheduled appointment time but was unable to reach patient successfully. A HIPPA compliant voice message was left encouraging patient to return call once available if she still was interested in [facility] Services. LCSW will close referral at this time as several unsuccessful outreaches have been made. ? ?Dickie La, BSW, MSW, LCSW ?Managed Medicaid LCSW ?[place]  [facility] ?[email or web address] ?Phone: [phone] ? ? ? ?

## 2021-08-03 ENCOUNTER — Ambulatory Visit: Payer: Self-pay

## 2021-08-03 NOTE — Telephone Encounter (Signed)
?  Chief Complaint: right ear pain ?Symptoms: pain right earFrequency: 1 week ?Pertinent Negatives: Patient denies drainage ?Disposition: [] ED /[] Urgent Care (no appt availability in office) / [x] Appointment(In office/virtual)/ []  [place] [facility]/ [] Home Care/ [] Refused Recommended Disposition /[] [place] [facility]/ []  Follow-up with PCP ?Additional Notes: pt refused appt today. Pt is scheduled later ? ? ? ? ? ?Reason for Disposition ? Earache  (Exceptions: brief ear pain of < 60 minutes duration, earache occurring during air travel ? ?Answer Assessment - Initial Assessment Questions ?1. LOCATION: "Which ear is involved?" ?    Right  ?2. ONSET: "When did the ear start hurting"  ?    1 week ?3. SEVERITY: "How bad is the pain?"  (Scale 1-10; mild, moderate or severe) ?  - MILD (1-3): doesn't interfere with normal activities  ?  - MODERATE (4-7): interferes with normal activities or awakens from sleep  ?  - SEVERE (8-10): excruciating pain, unable to do any normal activities  ?    severe ?4. URI SYMPTOMS: "Do you have a runny nose or cough?" ?    Yes throat hurts, cough ?5. FEVER: "Do you have a fever?" If Yes, ask: "What is your temperature, how was it measured, and when did it start?" ?    no ?6. CAUSE: "Have you been swimming recently?", "How often do you use Q-TIPS?", "Have you had any recent air travel or scuba diving?" ?    H/o q tips ?7. OTHER SYMPTOMS: "Do you have any other symptoms?" (e.g., headache, stiff neck, dizziness, vomiting, runny nose, decreased hearing) ?    Headache  ?8. PREGNANCY: "Is there any chance you are pregnant?" "When was your last menstrual period?" ?    *No Answer* ? ?Protocols used: Earache-A-AH ? ?

## 2021-08-05 ENCOUNTER — Ambulatory Visit: Payer: Medicaid Other | Admitting: Family Medicine

## 2021-08-05 ENCOUNTER — Encounter: Payer: Self-pay | Admitting: Family Medicine

## 2021-08-05 VITALS — BP 115/72 | HR 71 | Temp 97.9°F | Wt 186.2 lb

## 2021-08-05 DIAGNOSIS — J0100 Acute maxillary sinusitis, unspecified: Secondary | ICD-10-CM | POA: Insufficient documentation

## 2021-08-05 DIAGNOSIS — H938X2 Other specified disorders of left ear: Secondary | ICD-10-CM | POA: Insufficient documentation

## 2021-08-05 DIAGNOSIS — M940 Chondrocostal junction syndrome [Tietze]: Secondary | ICD-10-CM | POA: Diagnosis not present

## 2021-08-05 MED ORDER — AZITHROMYCIN 250 MG PO TABS: ORAL_TABLET | ORAL | 0 refills | Status: AC

## 2021-08-05 MED ORDER — FLUTICASONE PROPIONATE 50 MCG/ACT NA SUSP: 2.0000 | Freq: Every day | NASAL | 6 refills | Status: DC

## 2021-08-05 MED ORDER — DOXYCYCLINE HYCLATE 100 MG PO TABS
100.0000 mg | ORAL_TABLET | Freq: Two times a day (BID) | ORAL | 0 refills | Status: DC
Start: 2021-08-05 — End: 2021-12-13

## 2021-08-05 MED ORDER — MELOXICAM 15 MG PO TABS
15.0000 mg | ORAL_TABLET | Freq: Every day | ORAL | 0 refills | Status: DC
Start: 2021-08-05 — End: 2021-08-24

## 2021-08-05 NOTE — Progress Notes (Signed)
? ?[employer] as a Education administrator for Gwyneth Sprout, FNP.,have documented all relevant documentation on the behalf of Gwyneth Sprout, FNP,as directed by  Gwyneth Sprout, FNP while in the presence of Gwyneth Sprout, FNP.  ?New patient visit ? ? ?Patient: Martha Herrera   DOB: 20-Mar-1996   26 y.o. Female  MRN: [id number] ?Visit Date: 08/05/2021 ? ?Today's healthcare provider: Gwyneth Sprout, FNP  ? ?Introduced to Designer, jewellery role and practice setting.  All questions answered.  Discussed provider/patient relationship and expectations. ? ? ?Chief Complaint  ?Patient presents with  ? Breast Pain  ?  Patient comes in office today with complaints of left breast pain that she describes as sharp and dull for the past 3 weeks. Patient denies swelling, redness or nipple discharge.   ? ?Subjective  ?  ?Martha Herrera is a 26 y.o. female who presents today as a new patient to establish care.  ?HPI ?HPI   ? ? Breast Pain   ? Additional comments: Patient comes in office today with complaints of left breast pain that she describes as sharp and dull for the past 3 weeks. Patient denies swelling, redness or nipple discharge.  ? ?  ?  ?Last edited by Minette Headland, CMA on 08/05/2021  2:08 PM.  ?  ? ? ?Past Medical History:  ?Diagnosis Date  ? Complication of anesthesia   ? hard to wake up high level with CS  ? Gastroesophageal reflux   ? takes prescribed meds daily  ? Gestational diabetes   ? Headache(784.0)   ? not as often  ? Heart murmur   ? Pneumonia 2013  ? Varicella   ? ?Past Surgical History:  ?Procedure Laterality Date  ? CESAREAN SECTION N/A 08/07/2018  ? Procedure: CESAREAN SECTION;  Surgeon: Florian Buff, MD;  Location: [facility];  Service: Obstetrics;  Laterality: N/A;  ? CESAREAN SECTION N/A 01/20/2021  ? Procedure: CESAREAN SECTION;  Surgeon: Gwynne Edinger, MD;  Location: [facility];  Service: Obstetrics;  Laterality: N/A;  ? CESAREAN SECTION WITH BILATERAL TUBAL LIGATION Bilateral 01/20/2021  ?  ESOPHAGOGASTRODUODENOSCOPY  05/03/2012  ? Procedure: ESOPHAGOGASTRODUODENOSCOPY (EGD);  Surgeon: Oletha Blend, MD;  Location: [place];  Service: Gastroenterology;  Laterality: N/A;  ? tubes in ears    ? WISDOM TOOTH EXTRACTION    ? ?Family Status  ?Relation Name Status  ? Father  Alive  ? Brother 81 yo Alive  ? MGM  Alive  ? Brother 59 yo Alive  ? Mother  Alive  ? MGF  Deceased at age 93  ?     Lung Cancer  ? Sister 68 Alive  ? PGM  Deceased  ? PGF  Deceased  ? [place]  ? [place]  ? [place]  ? Annamarie Major x3 Alive  ? Son  Alive  ? Neg Hx  (Not Specified)  ? ?Family History  ?Problem Relation Age of Onset  ? GER disease Father   ? Cancer Father   ?     kidney  ? Asthma Brother   ? Miscarriages / [place] Brother   ? GER disease Maternal Grandmother   ? Hyperlipidemia Maternal Grandmother   ? ADD / ADHD Brother   ? Miscarriages / [place] Mother   ? Headache Mother   ? Cancer Maternal Grandfather   ? Diabetes Maternal Grandfather   ? Hyperlipidemia Maternal Grandfather   ?  Vision loss Maternal Grandfather   ? GER disease Maternal Aunt   ? Migraines Maternal Aunt   ? Anxiety disorder Maternal Aunt   ? Diabetes Paternal Aunt   ? Cancer Paternal Uncle   ?     spinal cancer  ? Hernia Son   ? Ataxia Neg Hx   ? [place] Neg Hx   ? Dementia Neg Hx   ? Mental retardation Neg Hx   ? Multiple sclerosis Neg Hx   ? Neurofibromatosis Neg Hx   ? Neuropathy Neg Hx   ? Parkinsonism Neg Hx   ? Seizures Neg Hx   ? Stroke Neg Hx   ? Depression Neg Hx   ? Bipolar disorder Neg Hx   ? ?Social History  ? ?Socioeconomic History  ? Marital status: Married  ?  Spouse name: Lacheryl Niesen  ? Number of children: 1  ? Years of education: Not on file  ? Highest education level: Associate degree: academic program  ?Occupational History  ? Not on file  ?Tobacco Use  ? Smoking status: Never  ? Smokeless tobacco: Never  ?Vaping Use  ? Vaping Use: Never used  ?Substance and Sexual Activity  ? Alcohol use: No  ? Drug use: No  ?  Sexual activity: Not Currently  ?  Birth control/protection: None  ?Other Topics Concern  ? Not on file  ?Social History Narrative  ? 10th grade  ? ?Social Determinants of Health  ? ?Financial Resource Strain: Low Risk   ? Difficulty of Paying Living Expenses: Not hard at all  ?Food Insecurity: No Food Insecurity  ? Worried About Charity fundraiser in the Last Year: Never true  ? Ran Out of Food in the Last Year: Never true  ?Transportation Needs: No Transportation Needs  ? Lack of Transportation (Medical): No  ? Lack of Transportation (Non-Medical): No  ?Physical Activity: Sufficiently Active  ? Days of Exercise per Week: 7 days  ? Minutes of Exercise per Session: 100 min  ?Stress: Stress Concern Present  ? Feeling of Stress : Rather much  ?Social Connections: Moderately Isolated  ? Frequency of Communication with Friends and Family: More than three times a week  ? Frequency of Social Gatherings with Friends and Family: Three times a week  ? Attends Religious Services: Never  ? Active Member of Clubs or Organizations: No  ? Attends Archivist Meetings: Never  ? Marital Status: Married  ? ?Outpatient Medications Prior to Visit  ?Medication Sig  ? loratadine (CLARITIN) 10 MG tablet Take 10 mg by mouth daily.  ? omeprazole (PRILOSEC) 40 MG capsule Take 1 capsule (40 mg total) by mouth daily.  ? FLUoxetine (PROZAC) 40 MG capsule Take 1 capsule (40 mg total) by mouth daily. (Patient not taking: Reported on 06/15/2021)  ? ?No facility-administered medications prior to visit.  ? ?Allergies  ?Allergen Reactions  ? Penicillins Hives, Itching and Rash  ?  Did it involve swelling of the face/tongue/throat, SOB, or low BP? No ?Did it involve sudden or severe rash/hives, skin peeling, or any reaction on the inside of your mouth or nose? No ?Did you need to seek medical attention at a hospital or doctor's office? No ?When did it last happen?     childhood allergy  ?If all above answers are "NO", may proceed with  cephalosporin use. ?  ? Amoxil [Amoxicillin] Itching and Rash  ?  Did it involve swelling of the face/tongue/throat, SOB, or low BP? No ?Did it involve  sudden or severe rash/hives, skin peeling, or any reaction on the inside of your mouth or nose? No ?Did you need to seek medical attention at a hospital or doctor's office? No ?When did it last happen?     2019 ?If all above answers are "NO", may proceed with cephalosporin use.  ? ? ?Immunization History  ?Administered Date(s) Administered  ? DTaP 11/07/1995, 03/10/1996, 04/15/1996, 03/31/1997, 06/29/2000  ? HPV Quadrivalent 09/12/2006, 12/14/2006, 03/05/2007  ? Hepatitis A, Ped/Adol-2 Dose 09/12/2006, 03/05/2007, 03/14/2013  ? Hepatitis B, ped/adol 08-06-95, 11/07/1995, 04/15/1996  ? IPV 11/07/1995, 03/10/1996, 04/15/1996, 06/29/2000  ? Influenza Nasal 02/05/2009  ? Influenza,inj,Quad PF,6+ Mos 01/18/2018, 01/11/2021  ? MMR 09/03/1996, 06/29/2000, 08/10/2018  ? Meningococcal Conjugate 09/12/2006  ? Meningococcal Polysaccharide 11/30/2011  ? PFIZER(Purple Top)SARS-COV-2 Vaccination 11/11/2019  ? Td 09/12/2006  ? Tdap 09/12/2006, 04/04/2017, 05/10/2018, 11/04/2020  ? ? ?Health Maintenance  ?Topic Date Due  ? COVID-19 Vaccine (2 - Pfizer risk series) 12/02/2019  ? INFLUENZA VACCINE  11/29/2021  ? PAP-Cervical Cytology Screening  11/11/2022  ? PAP SMEAR-Modifier  11/11/2022  ? TETANUS/TDAP  11/05/2030  ? HPV VACCINES  Completed  ? Hepatitis C Screening  Completed  ? HIV Screening  Completed  ? ? ?Patient Care Team: ?Gwyneth Sprout, FNP as PCP - General (Family Medicine) ? ?Review of Systems ? ? ? ? Objective  ?  ?BP 115/72   Pulse 71   Temp 97.9 ?F (36.6 ?C) (Temporal)   Wt 186 lb 3.2 oz (84.5 kg)   Breastfeeding No   BMI 38.92 kg/m?  ? ? ?Physical Exam ?Vitals and nursing note reviewed.  ?Constitutional:   ?   General: She is not in acute distress. ?   Appearance: Normal appearance. She is obese. She is not ill-appearing, toxic-appearing or diaphoretic.  ?HENT:   ?   Head: Normocephalic and atraumatic.  ?   Nose:  ?   Right Sinus: Maxillary sinus tenderness present.  ?   Left Sinus: Maxillary sinus tenderness present.  ?Cardiovascular:  ?   Rate and Rhythm: Normal rate and regula

## 2021-08-05 NOTE — Assessment & Plan Note (Signed)
Acute concern, will treat today ?Recommend use of flonase  ?RTC in 2 wks if not improved  ?

## 2021-08-05 NOTE — Assessment & Plan Note (Signed)
Associated with sinus complaints ?Use of flonase to decrease inflammation ?

## 2021-08-05 NOTE — Assessment & Plan Note (Signed)
Recommend use of Mobic to assist; may be linked with cough from sinus drainage  ?

## 2021-08-09 ENCOUNTER — Ambulatory Visit: Payer: Medicaid Other | Admitting: Family Medicine

## 2021-08-15 ENCOUNTER — Other Ambulatory Visit: Payer: Self-pay | Admitting: Family Medicine

## 2021-08-15 ENCOUNTER — Encounter: Payer: Self-pay | Admitting: Family Medicine

## 2021-08-15 DIAGNOSIS — K649 Unspecified hemorrhoids: Secondary | ICD-10-CM

## 2021-08-15 DIAGNOSIS — K5900 Constipation, unspecified: Secondary | ICD-10-CM

## 2021-08-15 MED ORDER — POLYETHYLENE GLYCOL 3350 17 GM/SCOOP PO POWD
17.0000 g | Freq: Two times a day (BID) | ORAL | 1 refills | Status: DC | PRN
Start: 2021-08-15 — End: 2022-05-03

## 2021-08-15 MED ORDER — HYDROCORTISONE ACETATE 25 MG RE SUPP: 25.0000 mg | Freq: Two times a day (BID) | RECTAL | 0 refills | Status: DC

## 2021-08-16 DIAGNOSIS — R109 Unspecified abdominal pain: Secondary | ICD-10-CM | POA: Diagnosis not present

## 2021-08-16 DIAGNOSIS — Z880 Allergy status to penicillin: Secondary | ICD-10-CM | POA: Diagnosis not present

## 2021-08-16 DIAGNOSIS — R110 Nausea: Secondary | ICD-10-CM | POA: Diagnosis not present

## 2021-08-16 DIAGNOSIS — R0789 Other chest pain: Secondary | ICD-10-CM | POA: Diagnosis not present

## 2021-08-16 DIAGNOSIS — R509 Fever, unspecified: Secondary | ICD-10-CM | POA: Diagnosis not present

## 2021-08-16 DIAGNOSIS — Z881 Allergy status to other antibiotic agents status: Secondary | ICD-10-CM | POA: Diagnosis not present

## 2021-08-17 ENCOUNTER — Telehealth: Payer: Self-pay

## 2021-08-17 NOTE — Telephone Encounter (Signed)
Transition Care Management Follow-up Telephone Call ?Date of discharge and from where: 08/16/2021-[facility]  ?How have you been since you were released from the hospital? Pt stated she still has some pain but she has not started taking her medication yet.  ?Any questions or concerns? No ? ?Items Reviewed: ?Did the pt receive and understand the discharge instructions provided? Yes  ?Medications obtained and verified? Yes  ?Other? No  ?Any new allergies since your discharge? No  ?Dietary orders reviewed? No ?Do you have support at home? Yes  ? ?Home Care and Equipment/Supplies: ?Were home health services ordered? not applicable ?If so, what is the name of the agency? N/A  ?Has the agency set up a time to come to the patient's home? not applicable ?Were any new equipment or medical supplies ordered?  No ?What is the name of the medical supply agency? N/A ?Were you able to get the supplies/equipment? not applicable ?Do you have any questions related to the use of the equipment or supplies? No ? ?Functional Questionnaire: (I = Independent and D = Dependent) ?ADLs: I ? ?Bathing/Dressing- I ? ?Meal Prep- I ? ?Eating- I ? ?Maintaining continence- I ? ?Transferring/Ambulation- I ? ?Managing Meds- I ? ?Follow up appointments reviewed: ? ?PCP Hospital f/u appt confirmed? Yes  Scheduled to see PCP on 08/23/2021 @ 1PM. ?[facility] f/u appt confirmed? Yes  Scheduled to see OBGYN on 08/24/2021 @ 10:10AM. ?Are transportation arrangements needed? No  ?If their condition worsens, is the pt aware to call PCP or go to the Emergency Dept.? Yes ?Was the patient provided with contact information for the PCP's office or ED? Yes ?Was to pt encouraged to call back with questions or concerns? Yes  ?

## 2021-08-22 NOTE — Progress Notes (Deleted)
      Established patient visit   Patient: Martha Herrera   DOB: [birth year]   25 y.o. Female  MRN: [id number] Visit Date: 08/23/2021  Today's healthcare provider: [name], FNP   No chief complaint on file.  Subjective    HPI  Hemorrhoids: Patient complains of {exam; rfv anorectal:14296}. Onset of symptoms was {onset:60104} ago with {clinical course - history:17::"unchanged"} course since that time.  She describes symptoms as {anorectal symptoms:14157}. Treatment to date has been {anorectal tx to [facility]:88502}. Patient denies {anorectal denies:14159}.   Medications: Outpatient Medications Prior to Visit  Medication Sig   doxycycline (VIBRA-TABS) 100 MG tablet Take 1 tablet (100 mg total) by mouth 2 (two) times daily.   FLUoxetine (PROZAC) 40 MG capsule Take 1 capsule (40 mg total) by mouth daily. (Patient not taking: Reported on 06/15/2021)   fluticasone (FLONASE) 50 MCG/ACT nasal spray Place 2 sprays into both nostrils daily.   hydrocortisone (ANUSOL-HC) 25 MG suppository Place 1 suppository (25 mg total) rectally 2 (two) times daily.   loratadine (CLARITIN) 10 MG tablet Take 10 mg by mouth daily.   meloxicam (MOBIC) 15 MG tablet Take 1 tablet (15 mg total) by mouth daily.   omeprazole (PRILOSEC) 40 MG capsule Take 1 capsule (40 mg total) by mouth daily.   polyethylene glycol powder (GLYCOLAX/MIRALAX) 17 GM/SCOOP powder Take 17 g by mouth 2 (two) times daily as needed.   No facility-administered medications prior to visit.    Review of Systems  {Labs  Heme  Chem  Endocrine  Serology  Results Review (optional):23779}   Objective    There were no vitals taken for this visit. {Show previous vital signs (optional):23777}  Physical Exam  ***  No results found for any visits on 08/23/21.  Assessment & Plan     ***  No follow-ups on file.      {provider attestation***:1}   [name], FNP  [facility] [phone] (phone) [phone]  (fax)  [facility]

## 2021-08-23 ENCOUNTER — Ambulatory Visit: Payer: Medicaid Other | Admitting: Family Medicine

## 2021-08-24 ENCOUNTER — Encounter: Payer: Self-pay | Admitting: Adult Health

## 2021-08-24 ENCOUNTER — Ambulatory Visit: Payer: Medicaid Other | Admitting: Adult Health

## 2021-08-24 VITALS — BP 119/83 | HR 76 | Ht <= 58 in | Wt 186.5 lb

## 2021-08-24 DIAGNOSIS — N920 Excessive and frequent menstruation with regular cycle: Secondary | ICD-10-CM | POA: Insufficient documentation

## 2021-08-24 DIAGNOSIS — Z98891 History of uterine scar from previous surgery: Secondary | ICD-10-CM

## 2021-08-24 DIAGNOSIS — N946 Dysmenorrhea, unspecified: Secondary | ICD-10-CM | POA: Diagnosis not present

## 2021-08-24 DIAGNOSIS — N941 Unspecified dyspareunia: Secondary | ICD-10-CM | POA: Diagnosis not present

## 2021-08-24 DIAGNOSIS — R102 Pelvic and perineal pain: Secondary | ICD-10-CM | POA: Insufficient documentation

## 2021-08-24 NOTE — Progress Notes (Signed)
?  Subjective:  ?  ? Patient ID: Martha Herrera, female   DOB: 07/15/1995, 26 y.o.   MRN: [id number] ? ?HPI ?Martha Herrera is a 26 year old white female, married, G2P2 in complaining of pelvic pain, painful periods and pain with sex, since last C-section and tubal. Periods last 3 days and may change pads every hour. With sex feels like he is hitting something and pain at  C-section scar. ?Lab Results  ?Component Value Date  ? DIAGPAP  11/11/2019  ?  - Negative for intraepithelial lesion or malignancy (NILM)  ?  ?Review of Systems ?+pelvic pain ?+Pain with sex ?+painful periods ?Periods heavy, changes pads every hour,last 3 days  ? ?  Reviewed past medical,surgical, social and family history. Reviewed medications and allergies.  ?Objective:  ? Physical Exam ?BP 119/83 (BP Location: Left Arm, Patient Position: Sitting, Cuff Size: Normal)   Pulse 76   Ht 4\' 10"  (1.473 m)   Wt 186 lb 8 oz (84.6 kg)   LMP 08/08/2021   Breastfeeding No   BMI 38.98 kg/m?   ?  Skin warm and dry. Lungs: clear to ausculation bilaterally. Cardiovascular: regular rate and rhythm.  ?Pelvic: external genitalia is normal in appearance no lesions, vagina: pink and moist,urethra has no lesions or masses noted, cervix:smooth, uterus: normal size, shape and contour, mildly tender, no masses felt, adnexa: no masses + tenderness noted from right to left. Bladder is non tender and no masses felt. ?Abdomen is soft and non tender no HSM. C-section scar is tender ?Fall risk is low ? [facility] - 08/24/21 1059   ? ?  ? Pregnancy Intention Screening  ? Does the patient want to become pregnant in the next year? No   ? Does the patient's partner want to become pregnant in the next year? No   ? Would the patient like to discuss contraceptive options today? No   ?  ? Contraception Wrap Up  ? Current Method Female Sterilization   ? End Method Female Sterilization   ? Contraception Counseling Provided No   ? ?  ?  ? ?  ? Co exam with 08/26/21 NP student  ?Assessment:   ?   ?1. Pelvic pain ?Pelvic Lorraine Lax scheduled at [facility] 09/05/21 at 8:30 am to assess uterus and ovaries  ?- 11/05/21 PELVIC COMPLETE WITH TRANSVAGINAL; Future ?Take 2 Extra strength tylenol and 600 mg ibuprofen every 8 hours with glass of water for pain ?And get pantie girdle and wear ?Discussed could be abdominal wall, adhesions or fibroid  ?Follow up with me in 2 week for ROS and review [place] and discuss options ? ?2. Dysmenorrhea ?- US PELVIC COMPLETE WITH TRANSVAGINAL; Future ? ?3. Menorrhagia with regular cycle ?- US PELVIC COMPLETE WITH TRANSVAGINAL; Future ? ?4. Dyspareunia, female ?- US PELVIC COMPLETE WITH TRANSVAGINAL; Future ? ?5. History of 2 cesarean sections ? ?   ?Plan:  ?   ?Follow up in 2 weeks  ?   ?

## 2021-09-05 ENCOUNTER — Ambulatory Visit (HOSPITAL_COMMUNITY): Admission: RE | Admit: 2021-09-05 | Payer: Medicaid Other | Source: Ambulatory Visit

## 2021-09-07 ENCOUNTER — Ambulatory Visit: Payer: Medicaid Other | Admitting: Adult Health

## 2021-09-14 ENCOUNTER — Ambulatory Visit (HOSPITAL_COMMUNITY): Admission: RE | Admit: 2021-09-14 | Payer: Medicaid Other | Source: Ambulatory Visit

## 2021-09-21 ENCOUNTER — Ambulatory Visit: Payer: Medicaid Other | Admitting: Adult Health

## 2021-09-28 DIAGNOSIS — Z880 Allergy status to penicillin: Secondary | ICD-10-CM | POA: Diagnosis not present

## 2021-09-28 DIAGNOSIS — J029 Acute pharyngitis, unspecified: Secondary | ICD-10-CM | POA: Diagnosis not present

## 2021-09-28 DIAGNOSIS — Z9851 Tubal ligation status: Secondary | ICD-10-CM | POA: Diagnosis not present

## 2021-09-30 ENCOUNTER — Telehealth: Payer: Self-pay

## 2021-09-30 NOTE — Telephone Encounter (Signed)
Transition Care Management Unsuccessful Follow-up Telephone Call  Date of discharge and from where:  09/29/2021-[name]  Attempts:  1st Attempt  Reason for unsuccessful TCM follow-up call:  Left voice message

## 2021-10-03 NOTE — Telephone Encounter (Signed)
Transition Care Management Unsuccessful Follow-up Telephone Call  Date of discharge and from where:  09/29/2021-[facility]  Attempts:  2nd Attempt  Reason for unsuccessful TCM follow-up call:  Left voice message

## 2021-10-04 NOTE — Telephone Encounter (Signed)
Transition Care Management Unsuccessful Follow-up Telephone Call  Date of discharge and from where:  09/29/2021-[facility]  Attempts:  3rd Attempt  Reason for unsuccessful TCM follow-up call:  Unable to reach patient

## 2021-10-17 ENCOUNTER — Encounter: Payer: Self-pay | Admitting: *Deleted

## 2021-10-17 ENCOUNTER — Emergency Department
Admission: EM | Admit: 2021-10-17 | Discharge: 2021-10-18 | Disposition: A | Discharge status: Home / Self Care | Payer: Medicaid Other | Attending: Emergency Medicine | Admitting: Emergency Medicine

## 2021-10-17 ENCOUNTER — Other Ambulatory Visit: Payer: Self-pay

## 2021-10-17 DIAGNOSIS — E876 Hypokalemia: Secondary | ICD-10-CM

## 2021-10-17 DIAGNOSIS — R1010 Upper abdominal pain, unspecified: Secondary | ICD-10-CM

## 2021-10-17 DIAGNOSIS — K8050 Calculus of bile duct without cholangitis or cholecystitis without obstruction: Secondary | ICD-10-CM

## 2021-10-17 DIAGNOSIS — R1013 Epigastric pain: Secondary | ICD-10-CM | POA: Diagnosis not present

## 2021-10-17 DIAGNOSIS — K8020 Calculus of gallbladder without cholecystitis without obstruction: Secondary | ICD-10-CM

## 2021-10-17 DIAGNOSIS — D72829 Elevated white blood cell count, unspecified: Secondary | ICD-10-CM | POA: Insufficient documentation

## 2021-10-17 DIAGNOSIS — K8051 Calculus of bile duct without cholangitis or cholecystitis with obstruction: Secondary | ICD-10-CM | POA: Insufficient documentation

## 2021-10-17 LAB — COMPREHENSIVE METABOLIC PANEL
ALT: 15 U/L (ref 0–44)
AST: 16 U/L (ref 15–41)
Albumin: 4.2 g/dL (ref 3.5–5.0)
Alkaline Phosphatase: 74 U/L (ref 38–126)
Anion gap: 10 (ref 5–15)
BUN: 21 mg/dL — ABNORMAL HIGH (ref 6–20)
CO2: 25 mmol/L (ref 22–32)
Calcium: 9.2 mg/dL (ref 8.9–10.3)
Chloride: 105 mmol/L (ref 98–111)
Creatinine, Ser: 0.72 mg/dL (ref 0.44–1.00)
GFR, Estimated: >60 mL/min (ref >60)
Glucose, Bld: 104 mg/dL — ABNORMAL HIGH (ref 70–99)
Potassium: 3.2 mmol/L — ABNORMAL LOW (ref 3.5–5.1)
Sodium: 140 mmol/L (ref 135–145)
Total Bilirubin: 0.6 mg/dL (ref 0.3–1.2)
Total Protein: 7 g/dL (ref 6.5–8.1)

## 2021-10-17 LAB — URINALYSIS, ROUTINE W REFLEX MICROSCOPIC
Bilirubin Urine: NEGATIVE
Glucose, UA: NEGATIVE mg/dL
Hgb urine dipstick: NEGATIVE
Ketones, ur: NEGATIVE mg/dL
Leukocytes,Ua: NEGATIVE
Nitrite: NEGATIVE
Protein, ur: NEGATIVE mg/dL
Specific Gravity, Urine: 1.03 (ref 1.005–1.030)
pH: 5 (ref 5.0–8.0)

## 2021-10-17 LAB — CBC
HCT: 39.4 % (ref 36.0–46.0)
Hemoglobin: 12.9 g/dL (ref 12.0–15.0)
MCH: 26.6 pg (ref 26.0–34.0)
MCHC: 32.7 g/dL (ref 30.0–36.0)
MCV: 81.2 fL (ref 80.0–100.0)
Platelets: 335 10*3/uL (ref 150–400)
RBC: 4.85 MIL/uL (ref 3.87–5.11)
RDW: 12.6 % (ref 11.5–15.5)
WBC: 11 10*3/uL — ABNORMAL HIGH (ref 4.0–10.5)
nRBC: 0 % (ref 0.0–0.2)

## 2021-10-17 LAB — LIPASE, BLOOD: Lipase: 30 U/L (ref 11–51)

## 2021-10-17 LAB — TROPONIN I (HIGH SENSITIVITY): Troponin I (High Sensitivity): 3 ng/L (ref <18)

## 2021-10-17 LAB — POC URINE PREG, ED: Preg Test, Ur: NEGATIVE

## 2021-10-17 NOTE — ED Provider Notes (Signed)
[facility] Provider Note    Event Date/Time   First MD Initiated Contact with Patient 10/17/21 2358     (approximate)   History   Abdominal Pain   HPI  Martha Herrera is a 26 y.o. female who presents to the ED from home with a chief complaint of upper abdominal pain.  Patient has a history of GERD on 40 mg bid pantoprazole.  Reports upper abdominal pain, waxing/waning for the past several months, worse tonight after eating [employer] chicken strips.  Symptoms associated with nausea, no vomiting.  Denies fever/chills, cough, chest pain, shortness of breath, dysuria or diarrhea.  Mentions most of the females in her family have had her gallbladder taken out.     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   Acute non-recurrent maxillary sinusitis 08/05/2021   Ear fullness, left 08/05/2021   Iron deficiency anemia secondary to inadequate dietary iron intake 06/09/2021   Annual physical exam 06/08/2021   Gastroesophageal reflux disease with esophagitis without hemorrhage 06/08/2021   Anxiety and depression 06/08/2021   Chronic tension-type headache, intractable 06/08/2021   Slow transit constipation 06/08/2021   Gestational hypertension 01/20/2021   History of bilateral tubal ligation 01/20/2021   S/P cesarean section 08/07/2018   History of gestational diabetes 05/13/2018   Tension headache 01/13/2013   Migraine without  aura 01/13/2013   Cardiac murmur 07/25/2012   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   Substernal chest pain 03/12/2012   Gastroesophageal reflux      Past Surgical History   Past Surgical History:  Procedure Laterality Date   CESAREAN SECTION N/A 08/07/2018   Procedure: CESAREAN SECTION;  Surgeon: Lazaro Arms, MD;  Location: [facility] LD ORS;  Service: Obstetrics;  Laterality: N/A;   CESAREAN SECTION N/A 01/20/2021   Procedure: CESAREAN SECTION;  Surgeon: Kathrynn Running, MD;  Location: [facility];  Service: Obstetrics;  Laterality: N/A;   CESAREAN SECTION WITH BILATERAL TUBAL LIGATION Bilateral 01/20/2021   ESOPHAGOGASTRODUODENOSCOPY  05/03/2012   Procedure: ESOPHAGOGASTRODUODENOSCOPY (EGD);  Surgeon: Jon Gills, MD;  Location: [facility] OR;  Service: Gastroenterology;  Laterality: N/A;   tubes in ears     WISDOM TOOTH EXTRACTION       Home Medications   Prior to Admission medications   Medication Sig Start Date End Date Taking? Authorizing Provider  ondansetron (ZOFRAN-ODT) 4 MG disintegrating tablet Take 1 tablet (4 mg total) by mouth every 8 (eight) hours as needed for nausea or vomiting. 10/18/21  Yes Irean Hong, MD  oxyCODONE-acetaminophen (PERCOCET/ROXICET) 5-325 MG tablet Take 1 tablet by mouth every 4 (four) hours as needed for severe pain. 10/18/21  Yes Irean Hong, MD  doxycycline (VIBRA-TABS) 100 MG tablet Take 1 tablet (100 mg total) by mouth 2 (two) times daily. 08/05/21   [name], FNP  FLUoxetine (PROZAC) 40 MG  capsule Take 1 capsule (40 mg total) by mouth daily. Patient not taking: Reported on 06/15/2021 06/08/21   [name], FNP  fluticasone [facility]) 50 MCG/ACT nasal spray Place 2 sprays into both nostrils daily. 08/05/21   [name], FNP  hydrocortisone (ANUSOL-HC) 25 MG suppository Place 1 suppository (25 mg total) rectally 2 (two) times daily. Patient not taking: Reported on 08/24/2021 08/15/21   [name], FNP  loratadine (CLARITIN) 10 MG tablet Take 10 mg  by mouth daily.    [provider]  omeprazole (PRILOSEC) 40 MG capsule Take 1 capsule (40 mg total) by mouth daily. 06/08/21   [name], FNP  polyethylene glycol powder (GLYCOLAX/MIRALAX) 17 GM/SCOOP powder Take 17 g by mouth 2 (two) times daily as needed. Patient not taking: Reported on 08/24/2021 08/15/21   [name], FNP     Allergies  Penicillins and Amoxil [amoxicillin]   Family History   Family History  Problem Relation Age of Onset   GER disease Maternal Grandmother    Hyperlipidemia Maternal Grandmother    Cancer Maternal Grandfather    Diabetes Maternal Grandfather    Hyperlipidemia Maternal Grandfather    Vision loss Maternal Grandfather    GER disease Father    Cancer Father        kidney   Miscarriages / Stillbirths Mother    Headache Mother    Asthma Brother    Miscarriages / [place] Brother    ADD / ADHD Brother    Asthma Son    GER disease Maternal Aunt    Migraines Maternal Aunt    Anxiety disorder Maternal Aunt    Diabetes Paternal Aunt    Cancer Paternal Uncle        spinal cancer   Ataxia Neg Hx    Chorea Neg Hx    Dementia Neg Hx    Mental retardation Neg Hx    Multiple sclerosis Neg Hx    Neurofibromatosis Neg Hx    Neuropathy Neg Hx    Parkinsonism Neg Hx    Seizures Neg Hx    Stroke Neg Hx    Depression Neg Hx    Bipolar disorder Neg Hx      Physical Exam  Triage Vital Signs: ED Triage Vitals  Enc Vitals Group     BP 10/17/21 2055 127/81     Pulse Rate 10/17/21 2055 74     Resp 10/17/21 2055 18     Temp 10/17/21 2055 98.5 F (36.9 C)     Temp Source 10/17/21 2055 Oral     SpO2 10/17/21 2055 97 %     Weight 10/17/21 2052 190 lb (86.2 kg)     Height 10/17/21 2052 4\' 10"  (1.473 m)     Head Circumference --      Peak Flow --      Pain Score 10/17/21 2051 7     Pain Loc --      Pain Edu? --      Excl. in GC? --     Updated Vital Signs: BP 127/81 (BP Location: Left Arm)   Pulse 74   Temp 98.5 F (36.9 C)  (Oral)   Resp 18   Ht 4\' 10"  (1.473 m)   Wt 86.2 kg   LMP 10/03/2021 (Approximate)   SpO2 97%   BMI 39.71 kg/m    General: Awake, no distress.  CV:  RRR.  Good peripheral perfusion.  Resp:  Normal effort.  CTA B. Abd:  Mildly tender epigastrium without rebound or guarding.  No distention.  No CVAT. Other:  No vesicles.   ED Results / Procedures / Treatments  Labs (all labs ordered are listed, but only abnormal results are displayed) Labs Reviewed  COMPREHENSIVE METABOLIC PANEL - Abnormal; Notable for the following components:      Result Value   Potassium 3.2 (*)    Glucose, Bld 104 (*)    BUN 21 (*)    All other components within normal limits  CBC - Abnormal; Notable for the following components:   WBC 11.0 (*)    All other components within normal limits  URINALYSIS, ROUTINE W REFLEX MICROSCOPIC - Abnormal; Notable for the following components:   Color, Urine YELLOW (*)    APPearance HAZY (*)    All other components within normal limits  LIPASE, BLOOD  POC URINE PREG, ED  TROPONIN I (HIGH SENSITIVITY)     EKG  ED ECG REPORT I, Shahzaib Azevedo J, the attending physician, personally viewed and interpreted this ECG.   Date: 10/18/2021  EKG Time: 2052  Rate: 74  Rhythm: normal sinus rhythm  Axis: Normal  Intervals:none  ST&T Change: Nonspecific    RADIOLOGY I have independently visualized and interpreted patient's ultrasound as well as noted the radiology interpretation:  Ultrasound: Cholelithiasis without cholecystitis  Official radiology report(s): US ABDOMEN LIMITED RUQ (LIVER/GB)  Result Date: 10/18/2021 CLINICAL DATA:  Epigastric pain for 2 weeks EXAM: ULTRASOUND ABDOMEN LIMITED RIGHT UPPER QUADRANT COMPARISON:  None Available. FINDINGS: Gallbladder: Contracted gallbladder with multiple gallstones. No gallbladder wall thickening or edema. Murphy's sign is negative. Common bile duct: Diameter: 3 mm, normal Liver: No focal lesion identified. Within normal  limits in parenchymal echogenicity. Portal vein is patent on color Doppler imaging with normal direction of blood flow towards the liver. Other: None. IMPRESSION: Cholelithiasis. No additional changes to suggest acute cholecystitis. Electronically Signed   By: Burman Nieves M.D.   On: 10/18/2021 01:12     PROCEDURES:  Critical Care performed: No  Procedures   MEDICATIONS ORDERED IN ED: Medications  famotidine (PEPCID) IVPB 20 mg premix (20 mg Intravenous New Bag/Given 10/18/21 0108)  oxyCODONE-acetaminophen (PERCOCET/ROXICET) 5-325 MG per tablet 1 tablet (has no administration in time range)  potassium chloride SA (KLOR-CON M) CR tablet 40 mEq (has no administration in time range)  sodium chloride 0.9 % bolus 500 mL (500 mLs Intravenous New Bag/Given 10/18/21 0107)  ondansetron (ZOFRAN) injection 4 mg (4 mg Intravenous Given 10/18/21 0108)  fentaNYL (SUBLIMAZE) injection 50 mcg (50 mcg Intravenous Given 10/18/21 0108)     IMPRESSION / MDM / ASSESSMENT AND PLAN / ED COURSE  I reviewed the triage vital signs and the nursing notes.                             26 year old female presenting with upper abdominal pain.  I have identified this patient to have a potentially life-threatening condition; at minimum she has an acute illness. Differential diagnosis includes, but is not limited to, biliary disease (biliary colic, acute cholecystitis, cholangitis, choledocholithiasis, etc), intrathoracic causes for epigastric abdominal pain including ACS, gastritis, duodenitis, pancreatitis, small bowel or large bowel obstruction, abdominal aortic aneurysm, hernia, and ulcer(s).   I have personally reviewed patient's records and note she had an ED visit for sore throat 09/29/2021, GYN office visit for pelvic pain 08/24/2021.  Patient reports she had EGD several years ago but does not remember the results.  I  am unfortunately unable to find those results either.  Laboratory results demonstrate mild  leukocytosis WBC 11, mild hypokalemia potassium 3.2, normal LFTs/lipase, negative troponin, normal urine.  Will obtain right upper quadrant abdominal ultrasound to evaluate cholecystitis.  Initiate IV fluids, IV fentanyl and Zofran for pain and nausea, add IV Pepcid for GERD.  Will reassess.  Clinical Course as of 10/18/21 0126  Tue Oct 18, 2021  0122 Ultrasound demonstrates cholelithiasis without cholecystitis.  Patient feeling better.  Will discharge home with as needed prescriptions for Percocet and Zofran, and patient will follow-up with general surgery.  Strict return precautions given.  Patient and family member verbalized understanding agree with plan of care. [JS]    Clinical Course User Index [JS] Irean Hong, MD     FINAL CLINICAL IMPRESSION(S) / ED DIAGNOSES   Final diagnoses:  Pain of upper abdomen  Hypokalemia  Biliary colic  Calculus of gallbladder without cholecystitis without obstruction     Rx / DC Orders   ED Discharge Orders          Ordered    oxyCODONE-acetaminophen (PERCOCET/ROXICET) 5-325 MG tablet  Every 4 hours PRN        10/18/21 0123    ondansetron (ZOFRAN-ODT) 4 MG disintegrating tablet  Every 8 hours PRN        10/18/21 0123             Note:  This document was prepared using Dragon voice recognition software and may include unintentional dictation errors.   Irean Hong, MD 10/18/21 [phone]

## 2021-10-17 NOTE — ED Triage Notes (Signed)
Pt reports upper abd pain.  Pt taking meds without relief.  Pt has nausea.  No v/d.  Pt states pain radiates into chest.  Pt alert.

## 2021-10-17 NOTE — ED Provider Notes (Incomplete)
Martha Herrera Provider Note    Event Date/Time   First MD Initiated Contact with Patient 10/17/21 2358     (approximate)   History   Abdominal Pain   HPI {Remember to add pertinent medical, surgical, social, and/or OB history to HPI:1} Martha Herrera is a 26 y.o. female  ***       Past Medical History   Past Medical History:  Diagnosis Date  . Complication of anesthesia    hard to wake up high level with CS  . Gastroesophageal reflux    takes prescribed meds daily  . Gestational diabetes   . Headache(784.0)    not as often  . Heart murmur   . Pneumonia 2013  . Varicella      Active Problem List   Patient Active Problem List   Diagnosis Date Noted  . Dyspareunia, female 08/24/2021  . Menorrhagia with regular cycle 08/24/2021  . Dysmenorrhea 08/24/2021  . Pelvic pain 08/24/2021  . History of 2 cesarean sections 08/24/2021  . 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 Acute non-recurrent maxillary sinusitis 08/05/2021  . Ear fullness, left 08/05/2021  . Iron deficiency anemia secondary to inadequate dietary iron intake 06/09/2021  . Annual physical exam 06/08/2021  . Gastroesophageal reflux disease with esophagitis without hemorrhage 06/08/2021  . Anxiety and depression 06/08/2021  . Chronic tension-type headache, intractable 06/08/2021  . Slow transit constipation 06/08/2021  . Gestational hypertension 01/20/2021  . History of bilateral tubal ligation 01/20/2021  . S/P cesarean section 08/07/2018  . History of gestational diabetes 05/13/2018  . Tension headache 01/13/2013  . Migraine without aura 01/13/2013  . Cardiac murmur 07/25/2012  . 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 Substernal chest pain 03/12/2012  . Gastroesophageal reflux      Past Surgical History   Past Surgical History:  Procedure Laterality Date  . CESAREAN SECTION N/A 08/07/2018   Procedure: CESAREAN SECTION;  Surgeon: Lazaro Arms, MD;  Location: [facility] LD ORS;  Service:  Obstetrics;  Laterality: N/A;  . CESAREAN SECTION N/A 01/20/2021   Procedure: CESAREAN SECTION;  Surgeon: Kathrynn Running, MD;  Location: [facility];  Service: Obstetrics;  Laterality: N/A;  . CESAREAN SECTION WITH BILATERAL TUBAL LIGATION Bilateral 01/20/2021  . ESOPHAGOGASTRODUODENOSCOPY  05/03/2012   Procedure: ESOPHAGOGASTRODUODENOSCOPY (EGD);  Surgeon: Jon Gills, MD;  Location: [facility] OR;  Service: Gastroenterology;  Laterality: N/A;  . tubes in ears    . WISDOM TOOTH EXTRACTION       Home Medications   Prior to Admission medications   Medication Sig Start Date End Date Taking? Authorizing Provider  doxycycline (VIBRA-TABS) 100 MG tablet Take 1 tablet (100 mg total) by mouth 2 (two) times daily. 08/05/21   [name], FNP  FLUoxetine (PROZAC) 40 MG capsule Take 1 capsule (40 mg total) by mouth daily. Patient not taking: Reported on 06/15/2021 06/08/21   [name], FNP  fluticasone [facility]) 50 MCG/ACT nasal spray Place 2 sprays into both nostrils daily. 08/05/21   [name], FNP  hydrocortisone (ANUSOL-HC) 25 MG suppository Place 1 suppository (25 mg total) rectally 2 (two) times daily. Patient not taking: Reported on 08/24/2021 08/15/21   [name], FNP  loratadine (CLARITIN) 10 MG tablet Take 10 mg by mouth daily.    [provider]  omeprazole (PRILOSEC) 40 MG capsule Take 1 capsule (40 mg total) by mouth daily. 06/08/21   [name], FNP  polyethylene glycol powder (GLYCOLAX/MIRALAX) 17 GM/SCOOP powder Take  17 g by mouth 2 (two) times daily as needed. Patient not taking: Reported on 08/24/2021 08/15/21   [name], FNP     Allergies  Penicillins and Amoxil [amoxicillin]   Family History   Family History  Problem Relation Age of Onset  . GER disease Maternal Grandmother   . Hyperlipidemia Maternal Grandmother   . Cancer Maternal Grandfather   . Diabetes Maternal Grandfather   . Hyperlipidemia Maternal Grandfather   . Vision loss Maternal  Grandfather   . GER disease Father   . Cancer Father        kidney  . Miscarriages / [place] Mother   . Headache Mother   . Asthma Brother   . Miscarriages / [place] Brother   . ADD / ADHD Brother   . Asthma Son   . GER disease Maternal Aunt   . Migraines Maternal Aunt   . Anxiety disorder Maternal Aunt   . Diabetes Paternal Aunt   . Cancer Paternal Uncle        spinal cancer  . Ataxia Neg Hx   . Chorea Neg Hx   . Dementia Neg Hx   . Mental retardation Neg Hx   . Multiple sclerosis Neg Hx   . Neurofibromatosis Neg Hx   . Neuropathy Neg Hx   . Parkinsonism Neg Hx   . Seizures Neg Hx   . Stroke Neg Hx   . Depression Neg Hx   . Bipolar disorder Neg Hx      Physical Exam  Triage Vital Signs: ED Triage Vitals  Enc Vitals Group     BP 10/17/21 2055 127/81     Pulse Rate 10/17/21 2055 74     Resp 10/17/21 2055 18     Temp 10/17/21 2055 98.5 F (36.9 C)     Temp Source 10/17/21 2055 Oral     SpO2 10/17/21 2055 97 %     Weight 10/17/21 2052 190 lb (86.2 kg)     Height 10/17/21 2052 4\' 10"  (1.473 m)     Head Circumference --      Peak Flow --      Pain Score 10/17/21 2051 7     Pain Loc --      Pain Edu? --      Excl. in GC? --     Updated Vital Signs: BP 127/81 (BP Location: Left Arm)   Pulse 74   Temp 98.5 F (36.9 C) (Oral)   Resp 18   Ht 4\' 10"  (1.473 m)   Wt 86.2 kg   LMP 10/03/2021 (Approximate)   SpO2 97%   BMI 39.71 kg/m   {Only need to document appropriate and relevant physical exam:1} General: Awake, no distress. *** CV:  Good peripheral perfusion. *** Resp:  Normal effort. *** Abd:  No distention. *** Other:  ***   ED Results / Procedures / Treatments  Labs (all labs ordered are listed, but only abnormal results are displayed) Labs Reviewed  COMPREHENSIVE METABOLIC PANEL - Abnormal; Notable for the following components:      Result Value   Potassium 3.2 (*)    Glucose, Bld 104 (*)    BUN 21 (*)    All other components within  normal limits  CBC - Abnormal; Notable for the following components:   WBC 11.0 (*)    All other components within normal limits  URINALYSIS, ROUTINE W REFLEX MICROSCOPIC - Abnormal; Notable for the following components:   Color, Urine YELLOW (*)  APPearance HAZY (*)    All other components within normal limits  LIPASE, BLOOD  POC URINE PREG, ED  TROPONIN I (HIGH SENSITIVITY)     EKG  ***   RADIOLOGY *** {You MUST document your own interpretation of imaging, as well as the fact that you reviewed the radiologist's report!:1}  Official radiology report(s): No results found.   PROCEDURES:  Critical Care performed: {CriticalCareYesNo:19197::"Yes, see critical care procedure note(s)","No"}  Procedures   MEDICATIONS ORDERED IN ED: Medications - No data to display   IMPRESSION / MDM / ASSESSMENT AND PLAN / ED COURSE  I reviewed the triage vital signs and the nursing notes.                              Differential diagnosis includes, but is not limited to, ***  {If the patient is on the monitor, remove the brackets and asterisks on the sentence below and remember to document it as a Procedure as well. Otherwise delete the sentence below:1} {**The patient is on the cardiac monitor to evaluate for evidence of arrhythmia and/or significant heart rate changes.**}  {Remember to include, when applicable, any/all of the following data: independent review of imaging independent review of labs (comment specifically on pertinent positives and negatives) review of specific prior hospitalizations, PCP/specialist notes, etc. discuss meds given and prescribed document any discussion with consultants (including hospitalists) any clinical decision tools you used and why (PECARN, NEXUS, etc.) did you consider admitting the patient? document social determinants of health affecting patient's care (homelessness, inability to follow up in a timely fashion, etc) document any  pre-existing conditions increasing risk on current visit (e.g. diabetes and HTN increasing danger of high-risk chest pain/ACS) describes what meds you gave (especially parenteral) and why any other interventions?:1}      FINAL CLINICAL IMPRESSION(S) / ED DIAGNOSES   Final diagnoses:  Pain of upper abdomen     Rx / DC Orders   ED Discharge Orders     None        Note:  This document was prepared using Dragon voice recognition software and may include unintentional dictation errors.

## 2021-10-18 ENCOUNTER — Emergency Department: Payer: Medicaid Other

## 2021-10-18 DIAGNOSIS — K8020 Calculus of gallbladder without cholecystitis without obstruction: Secondary | ICD-10-CM | POA: Diagnosis not present

## 2021-10-18 DIAGNOSIS — R1013 Epigastric pain: Secondary | ICD-10-CM | POA: Diagnosis not present

## 2021-10-18 MED ORDER — FENTANYL CITRATE PF 50 MCG/ML IJ SOSY
50.0000 ug | PREFILLED_SYRINGE | Freq: Once | INTRAMUSCULAR | Status: AC
  Administered 2021-10-18: 50 ug via INTRAVENOUS
  Filled 2021-10-18: qty 1

## 2021-10-18 MED ORDER — POTASSIUM CHLORIDE CRYS ER 20 MEQ PO TBCR
40.0000 meq | EXTENDED_RELEASE_TABLET | Freq: Once | ORAL | Status: AC
  Administered 2021-10-18: 40 meq via ORAL
  Filled 2021-10-18: qty 2

## 2021-10-18 MED ORDER — ONDANSETRON 4 MG PO TBDP: 4.0000 mg | ORAL_TABLET | Freq: Three times a day (TID) | ORAL | 0 refills | Status: DC | PRN

## 2021-10-18 MED ORDER — SODIUM CHLORIDE 0.9 % IV BOLUS
500.0000 mL | Freq: Once | INTRAVENOUS | Status: AC
  Administered 2021-10-18: 500 mL via INTRAVENOUS

## 2021-10-18 MED ORDER — FAMOTIDINE IN NACL 20-0.9 MG/50ML-% IV SOLN
20.0000 mg | Freq: Once | INTRAVENOUS | Status: AC
Start: 2021-10-18 — End: 2021-10-18
  Administered 2021-10-18: 20 mg via INTRAVENOUS
  Filled 2021-10-18: qty 50

## 2021-10-18 MED ORDER — ONDANSETRON HCL 4 MG/2ML IJ SOLN
4.0000 mg | Freq: Once | INTRAMUSCULAR | Status: AC
  Administered 2021-10-18: 4 mg via INTRAVENOUS
  Filled 2021-10-18: qty 2

## 2021-10-18 MED ORDER — OXYCODONE-ACETAMINOPHEN 5-325 MG PO TABS
1.0000 | ORAL_TABLET | Freq: Once | ORAL | Status: AC
  Administered 2021-10-18: 1 via ORAL
  Filled 2021-10-18: qty 1

## 2021-10-18 MED ORDER — OXYCODONE-ACETAMINOPHEN 5-325 MG PO TABS: 1.0000 | ORAL_TABLET | ORAL | 0 refills | Status: DC | PRN

## 2021-10-18 NOTE — ED Notes (Signed)
ED Provider at bedside. 

## 2021-10-18 NOTE — Discharge Instructions (Signed)
1. Take medicines as needed for pain & nausea (Percocet/Zofran #30). 2. Clear liquids x 12 hours, then bland diet x 1 week, then slowly advance diet as tolerated. Avoid fatty, greasy, spicy foods and drinks. 3. Return to the ER for worsening symptoms, persistent vomiting, fever, difficulty breathing or other concerns.  

## 2021-10-19 ENCOUNTER — Ambulatory Visit: Payer: Medicaid Other | Admitting: Family Medicine

## 2021-10-25 ENCOUNTER — Ambulatory Visit (HOSPITAL_COMMUNITY)
Admission: RE | Admit: 2021-10-25 | Discharge: 2021-10-25 | Disposition: A | Discharge status: Home / Self Care | Payer: BC Managed Care – PPO | Source: Ambulatory Visit | Attending: Adult Health | Admitting: Adult Health

## 2021-10-25 ENCOUNTER — Ambulatory Visit: Payer: Medicaid Other | Admitting: Family Medicine

## 2021-10-25 DIAGNOSIS — R102 Pelvic and perineal pain: Secondary | ICD-10-CM

## 2021-10-25 DIAGNOSIS — N920 Excessive and frequent menstruation with regular cycle: Secondary | ICD-10-CM | POA: Diagnosis not present

## 2021-10-25 DIAGNOSIS — N946 Dysmenorrhea, unspecified: Secondary | ICD-10-CM | POA: Diagnosis not present

## 2021-10-25 DIAGNOSIS — N941 Unspecified dyspareunia: Secondary | ICD-10-CM

## 2021-10-25 DIAGNOSIS — R1013 Epigastric pain: Secondary | ICD-10-CM | POA: Diagnosis not present

## 2021-10-26 ENCOUNTER — Ambulatory Visit: Payer: Medicaid Other | Admitting: Family Medicine

## 2021-10-31 ENCOUNTER — Emergency Department
Admission: EM | Admit: 2021-10-31 | Discharge: 2021-10-31 | Disposition: A | Discharge status: Home / Self Care | Payer: BC Managed Care – PPO | Attending: Emergency Medicine | Admitting: Emergency Medicine

## 2021-10-31 ENCOUNTER — Other Ambulatory Visit: Payer: Self-pay

## 2021-10-31 ENCOUNTER — Emergency Department: Payer: BC Managed Care – PPO

## 2021-10-31 DIAGNOSIS — R1011 Right upper quadrant pain: Secondary | ICD-10-CM | POA: Insufficient documentation

## 2021-10-31 DIAGNOSIS — R110 Nausea: Secondary | ICD-10-CM | POA: Diagnosis not present

## 2021-10-31 DIAGNOSIS — K8020 Calculus of gallbladder without cholecystitis without obstruction: Secondary | ICD-10-CM | POA: Diagnosis not present

## 2021-10-31 DIAGNOSIS — R1013 Epigastric pain: Secondary | ICD-10-CM | POA: Insufficient documentation

## 2021-10-31 DIAGNOSIS — K219 Gastro-esophageal reflux disease without esophagitis: Secondary | ICD-10-CM | POA: Insufficient documentation

## 2021-10-31 LAB — COMPREHENSIVE METABOLIC PANEL
ALT: 16 U/L (ref 0–44)
AST: 17 U/L (ref 15–41)
Albumin: 4.1 g/dL (ref 3.5–5.0)
Alkaline Phosphatase: 73 U/L (ref 38–126)
Anion gap: 7 (ref 5–15)
BUN: 15 mg/dL (ref 6–20)
CO2: 25 mmol/L (ref 22–32)
Calcium: 9.1 mg/dL (ref 8.9–10.3)
Chloride: 106 mmol/L (ref 98–111)
Creatinine, Ser: 0.73 mg/dL (ref 0.44–1.00)
GFR, Estimated: >60 mL/min (ref >60)
Glucose, Bld: 101 mg/dL — ABNORMAL HIGH (ref 70–99)
Potassium: 3.5 mmol/L (ref 3.5–5.1)
Sodium: 138 mmol/L (ref 135–145)
Total Bilirubin: 0.8 mg/dL (ref 0.3–1.2)
Total Protein: 7.4 g/dL (ref 6.5–8.1)

## 2021-10-31 LAB — URINALYSIS, ROUTINE W REFLEX MICROSCOPIC
Bilirubin Urine: NEGATIVE
Glucose, UA: NEGATIVE mg/dL
Hgb urine dipstick: NEGATIVE
Ketones, ur: NEGATIVE mg/dL
Leukocytes,Ua: NEGATIVE
Nitrite: NEGATIVE
Protein, ur: NEGATIVE mg/dL
Specific Gravity, Urine: 1.021 (ref 1.005–1.030)
pH: 8 (ref 5.0–8.0)

## 2021-10-31 LAB — CBC
HCT: 41.4 % (ref 36.0–46.0)
Hemoglobin: 13.6 g/dL (ref 12.0–15.0)
MCH: 26.5 pg (ref 26.0–34.0)
MCHC: 32.9 g/dL (ref 30.0–36.0)
MCV: 80.7 fL (ref 80.0–100.0)
Platelets: 297 10*3/uL (ref 150–400)
RBC: 5.13 MIL/uL — ABNORMAL HIGH (ref 3.87–5.11)
RDW: 12.5 % (ref 11.5–15.5)
WBC: 9.5 10*3/uL (ref 4.0–10.5)
nRBC: 0 % (ref 0.0–0.2)

## 2021-10-31 LAB — POC URINE PREG, ED: Preg Test, Ur: NEGATIVE

## 2021-10-31 LAB — LIPASE, BLOOD: Lipase: 27 U/L (ref 11–51)

## 2021-10-31 MED ORDER — ACETAMINOPHEN 10 MG/ML IV SOLN
1000.0000 mg | Freq: Once | INTRAVENOUS | Status: AC
  Filled 2021-10-31: qty 100

## 2021-10-31 MED ORDER — SODIUM CHLORIDE 0.9 % IV BOLUS
1000.0000 mL | Freq: Once | INTRAVENOUS | Status: AC
  Administered 2021-10-31: 1000 mL via INTRAVENOUS

## 2021-10-31 MED ORDER — ALUM & MAG HYDROXIDE-SIMETH 200-200-20 MG/5ML PO SUSP
30.0000 mL | Freq: Once | ORAL | Status: AC
  Administered 2021-10-31: 30 mL via ORAL
  Filled 2021-10-31: qty 30

## 2021-10-31 MED ORDER — LIDOCAINE VISCOUS HCL 2 % MT SOLN
15.0000 mL | Freq: Once | OROMUCOSAL | Status: AC
  Administered 2021-10-31: 15 mL via ORAL
  Filled 2021-10-31: qty 15

## 2021-10-31 MED ORDER — ONDANSETRON HCL 4 MG/2ML IJ SOLN
4.0000 mg | Freq: Once | INTRAMUSCULAR | Status: AC
Start: 2021-10-31 — End: 2021-10-31
  Administered 2021-10-31: 4 mg via INTRAVENOUS
  Filled 2021-10-31: qty 2

## 2021-10-31 NOTE — ED Notes (Signed)
See triage note.Presents with right sided abd and back pain  states pain started this am   describes pain as "burning"    States she was dx'd with gallstones recently

## 2021-10-31 NOTE — ED Provider Notes (Signed)
[facility] Provider Note    Event Date/Time   First MD Initiated Contact with Patient 10/31/21 1201     (approximate)   History   Abdominal Pain   HPI  Martha Herrera is a 26 y.o. female with a  a PMH of cholelithiasis, tubal ligation, anxiety, depression, GERD who presents today for evaluation of epigastric pain.  Patient reports that this has been ongoing for approximately 2 weeks.  She reports that her pain felt like a burning sensation that she thought was her GERD.  Patient was seen in the emergency department on 10/17/2021 and had a right upper quadrant ultrasound which revealed cholelithiasis without acute cholecystitis.  She subsequently was evaluated by general surgery on 10/25/2021 who felt that symptoms were more related to stomach rather than gallbladder.  The recommendation was for EGD, as well as Carafate and Protonix.  Patient reports that her pain worsened overnight, around 6 AM.  She reports that last night she had a sloppy Joe.  She reports that the pain in her epigastric and right upper quadrant area.  She denies fevers or chills.  She reports that she feels nauseated but has not had vomiting.  No lower abdominal pain.  No vaginal discharge or bleeding.  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   Acute non-recurrent maxillary sinusitis 08/05/2021   Ear fullness, left 08/05/2021   Iron deficiency anemia secondary to inadequate dietary iron intake 06/09/2021   Annual physical exam 06/08/2021   Gastroesophageal reflux disease with esophagitis without hemorrhage 06/08/2021   Anxiety and depression 06/08/2021   Chronic tension-type headache, intractable 06/08/2021   Slow transit constipation 06/08/2021   Gestational hypertension  01/20/2021   History of bilateral tubal ligation 01/20/2021   S/P cesarean section 08/07/2018   History of gestational diabetes 05/13/2018   Tension headache 01/13/2013   Migraine without aura 01/13/2013   Cardiac murmur 07/25/2012   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 --      Excl. in GC? --     Most recent vital signs: Vitals:   10/31/21 1132 10/31/21 1443  BP: 119/82 120/78  Pulse: 83 80  Resp: 18 18  Temp: 98.8 F (37.1 C)   SpO2: 98% 98%    Physical Exam Vitals and nursing note reviewed.  Constitutional:      General: Awake and alert. No acute distress.    Appearance: Normal appearance. The patient  is obese.  HENT:     Head: Normocephalic and atraumatic.     Mouth: Mucous membranes are moist.  Eyes:     General: PERRL. Normal EOMs        Right eye: No discharge.        Left eye: No discharge.     Conjunctiva/sclera: Conjunctivae normal.  Cardiovascular:     Rate and Rhythm: Normal rate and regular rhythm.     Pulses: Normal pulses.     Heart sounds: Normal heart sounds Pulmonary:     Effort: Pulmonary effort is normal. No respiratory distress.     Breath sounds: Normal breath sounds.  Abdominal:     Abdomen is soft. There is mild epigastric and RUQ abdominal tenderness. No rebound or guarding. No distention. Negative Murphy sign Musculoskeletal:        General: No swelling. Normal range of motion.     Cervical back: Normal range  of motion and neck supple.  Skin:    General: Skin is warm and dry.     Capillary Refill: Capillary refill takes less than 2 seconds.     Findings: No rash.  Neurological:     Mental Status: The patient is awake and alert.      ED Results / Procedures / Treatments   Labs (all labs ordered are listed, but only abnormal results are displayed) Labs Reviewed  COMPREHENSIVE METABOLIC PANEL - Abnormal; Notable for the following components:      Result Value   Glucose, Bld 101 (*)    All other components within normal limits  CBC - Abnormal; Notable for the following components:   RBC 5.13 (*)    All other components within normal limits  URINALYSIS, ROUTINE W REFLEX MICROSCOPIC - Abnormal; Notable for the following components:   Color, Urine YELLOW (*)    APPearance HAZY (*)    All other components within normal limits  LIPASE, BLOOD  POC URINE PREG, ED     EKG     RADIOLOGY I independently reviewed and interpreted imaging and agree with radiologists findings.     PROCEDURES:  Critical Care performed:   Procedures   MEDICATIONS ORDERED IN ED: Medications  alum & mag hydroxide-simeth (MAALOX/MYLANTA) 200-200-20 MG/5ML suspension 30 mL (30 mLs Oral Given 10/31/21 1335)    And  lidocaine (XYLOCAINE) 2 % viscous mouth solution 15 mL (15 mLs Oral Given 10/31/21 1336)  sodium chloride 0.9 % bolus 1,000 mL (0 mLs Intravenous Stopped 10/31/21 1500)  acetaminophen (OFIRMEV) IV 1,000 mg (0 mg Intravenous Stopped 10/31/21 1443)  ondansetron (ZOFRAN) injection 4 mg (4 mg Intravenous Given 10/31/21 1356)     IMPRESSION / MDM / ASSESSMENT AND PLAN / ED COURSE  I reviewed the triage vital signs and the nursing notes.   Differential diagnosis includes, but is not limited to, biliary colic, cholecystitis, choledocholithiasis, gastritis, peptic ulcer disease, pancreatitis, less likely appendicitis, diverticulitis, UTI, pyelonephritis, STD/TOA, pregnancy related concern given history of  tubal ligation.  Patient presents emergency department awake and alert, hemodynamically stable and afebrile.  Labs obtained at triage are overall reassuring without leukocytosis, normal LFTs and lipase.  Urinalysis and urine pregnancy are negative.  Repeat right upper quadrant ultrasound was obtained for evaluation of possible acute cholecystitis or CBD dilatation.  Ultrasound demonstrated cholelithiasis without evidence of acute cholecystitis, normal common bile duct.  Patient endorsed significant relief of her symptoms after the GI cocktail.  Symptoms appear to be consistent with gastritis/peptic ulcer disease at that time.  She is scheduled for an endoscopy as recommended by general surgery in a couple of weeks.  I advised that she continue to take her Carafate and omeprazole as prescribed, and avoid spicy, acidic, fatty foods or caffeine/alcohol.  We did discuss strict return precautions and the importance of close outpatient follow-up.  Patient understands and agrees with plan.  Discharged in stable condition.   Patient's presentation is most consistent with acute complicated illness / injury requiring diagnostic workup.   Clinical Course as of 10/31/21 1644  Mon Oct 31, 2021  1349 US Abdomen Limited RUQ (LIVER/GB) [CM]  1411 US Abdomen Limited RUQ (LIVER/GB) [CM]    Clinical Course User Index [CM] Sheryle Spray, Student-PA     FINAL CLINICAL IMPRESSION(S) / ED DIAGNOSES   Final diagnoses:  Epigastric abdominal pain     Rx / DC Orders   ED Discharge Orders     None        Note:  This document was prepared using Dragon voice recognition software and may include unintentional dictation errors.   Keturah Shavers 10/31/21 1644    Sharman Cheek, MD 11/06/21 0003

## 2021-10-31 NOTE — ED Triage Notes (Signed)
Pt comes with c/o right upper pain. Pt states pain is 10/10. Pt states nausea. Pt states hx of gallstones.

## 2021-10-31 NOTE — Discharge Instructions (Signed)
Continue to take the omeprazole and carafate as prescribed by the general surgeon. Please schedule the EGD as recommended by general surgery. Please return for new, worsening, or change in symptoms or other concerns.

## 2021-11-02 ENCOUNTER — Telehealth: Payer: Self-pay | Admitting: Obstetrics and Gynecology

## 2021-11-02 NOTE — Patient Outreach (Signed)
Transition Care Management Follow-up Telephone Call Date of discharge and from where: 10/31/21 from [facility] How have you been since you were released from the hospital? About the same Any questions or concerns? No  Items Reviewed: Did the pt receive and understand the discharge instructions provided? Yes  Medications obtained and verified? Yes  Other? No  Any new allergies since your discharge? No  Dietary orders reviewed? Yes Do you have support at home? Yes   Home Care and Equipment/Supplies: Were home health services ordered? no If so, what is the name of the agency? N/A  Has the agency set up a time to come to the patient's home? not applicable Were any new equipment or medical supplies ordered?  No What is the name of the medical supply agency? N/A Were you able to get the supplies/equipment? not applicable Do you have any questions related to the use of the equipment or supplies? No  Functional Questionnaire: (I = Independent and D = Dependent) ADLs: I  Bathing/Dressing- I  Meal Prep- I  Eating- I  Maintaining continence- I  Transferring/Ambulation- I  Managing Meds- I  Follow up appointments reviewed:  PCP Hospital f/u appt confirmed? No  Specialist Hospital f/u appt confirmed? Yes  Scheduled to see GI on 11/24/21  Are transportation arrangements needed? No  If their condition worsens, is the pt aware to call PCP or go to the Emergency Dept.? Yes Was the patient provided with contact information for the PCP's office or ED? Yes Was to pt encouraged to call back with questions or concerns? Yes

## 2021-11-02 NOTE — Patient Outreach (Signed)
Transition Care Management Unsuccessful Follow-up Telephone Call  Date of discharge and from where:  10/31/21 from [facility]  Attempts:  1st Attempt  Reason for unsuccessful TCM follow-up call:  No answer/busy  Kathi Der RN, BSN [place]  [facility] Care Management Coordinator - Managed [place] [facility] [phone]

## 2021-12-10 ENCOUNTER — Encounter: Payer: Self-pay | Admitting: Podiatry

## 2021-12-10 DIAGNOSIS — J0100 Acute maxillary sinusitis, unspecified: Secondary | ICD-10-CM

## 2021-12-13 ENCOUNTER — Ambulatory Visit (INDEPENDENT_AMBULATORY_CARE_PROVIDER_SITE_OTHER): Payer: BC Managed Care – PPO | Admitting: Podiatry

## 2021-12-13 DIAGNOSIS — L600 Ingrowing nail: Secondary | ICD-10-CM

## 2021-12-13 DIAGNOSIS — L539 Erythematous condition, unspecified: Secondary | ICD-10-CM | POA: Diagnosis not present

## 2021-12-13 MED ORDER — DOXYCYCLINE HYCLATE 100 MG PO TABS: 100.0000 mg | ORAL_TABLET | Freq: Two times a day (BID) | ORAL | 0 refills | Status: DC

## 2021-12-14 ENCOUNTER — Encounter: Payer: Self-pay | Admitting: Surgery

## 2021-12-15 ENCOUNTER — Ambulatory Visit
Admission: RE | Admit: 2021-12-15 | Discharge: 2021-12-15 | Disposition: A | Discharge status: Home / Self Care | Payer: BC Managed Care – PPO | Attending: Surgery | Admitting: Surgery

## 2021-12-15 ENCOUNTER — Encounter
Admission: RE | Disposition: A | Discharge status: Home / Self Care | Payer: Self-pay | Source: Home / Self Care | Attending: Surgery

## 2021-12-15 ENCOUNTER — Ambulatory Visit: Payer: BC Managed Care – PPO | Admitting: Certified Registered"

## 2021-12-15 DIAGNOSIS — K2900 Acute gastritis without bleeding: Secondary | ICD-10-CM | POA: Diagnosis not present

## 2021-12-15 DIAGNOSIS — K2950 Unspecified chronic gastritis without bleeding: Secondary | ICD-10-CM | POA: Diagnosis not present

## 2021-12-15 DIAGNOSIS — B9681 Helicobacter pylori [H. pylori] as the cause of diseases classified elsewhere: Secondary | ICD-10-CM | POA: Diagnosis not present

## 2021-12-15 DIAGNOSIS — Z79899 Other long term (current) drug therapy: Secondary | ICD-10-CM | POA: Diagnosis not present

## 2021-12-15 DIAGNOSIS — K8020 Calculus of gallbladder without cholecystitis without obstruction: Secondary | ICD-10-CM | POA: Diagnosis not present

## 2021-12-15 DIAGNOSIS — R12 Heartburn: Secondary | ICD-10-CM | POA: Diagnosis not present

## 2021-12-15 HISTORY — PX: ESOPHAGOGASTRODUODENOSCOPY: SHX5428

## 2021-12-15 HISTORY — DX: Dysmenorrhea, unspecified: N94.6

## 2021-12-15 HISTORY — DX: Other underimmunization status: Z28.39

## 2021-12-15 HISTORY — DX: Tinea barbae and tinea capitis: B35.0

## 2021-12-15 HISTORY — DX: Supervision of other high risk pregnancies, unspecified trimester: O09.899

## 2021-12-15 LAB — POCT PREGNANCY, URINE: Preg Test, Ur: NEGATIVE

## 2021-12-15 SURGERY — EGD (ESOPHAGOGASTRODUODENOSCOPY)
Anesthesia: General | Site: Esophagus

## 2021-12-15 MED ORDER — DEXMEDETOMIDINE HCL IN NACL 200 MCG/50ML IV SOLN
INTRAVENOUS | Status: DC | PRN
  Administered 2021-12-15: 12 ug via INTRAVENOUS

## 2021-12-15 MED ORDER — PROPOFOL 10 MG/ML IV BOLUS
INTRAVENOUS | Status: DC | PRN
  Administered 2021-12-15: 50 mg via INTRAVENOUS
  Administered 2021-12-15: 120 mg via INTRAVENOUS
  Administered 2021-12-15: 20 mg via INTRAVENOUS

## 2021-12-15 MED ORDER — LIDOCAINE HCL (CARDIAC) PF 100 MG/5ML IV SOSY
PREFILLED_SYRINGE | INTRAVENOUS | Status: DC | PRN
  Administered 2021-12-15: 100 mg via INTRAVENOUS

## 2021-12-15 MED ORDER — SODIUM CHLORIDE 0.9 % IV SOLN: INTRAVENOUS | Status: DC

## 2021-12-15 NOTE — Anesthesia Postprocedure Evaluation (Signed)
Anesthesia Post Note  Patient: Martha Herrera  Procedure(s) Performed: ESOPHAGOGASTRODUODENOSCOPY (EGD) (Esophagus)  Patient location during evaluation: PACU Anesthesia Type: General Level of consciousness: awake and alert Pain management: pain level controlled Vital Signs Assessment: post-procedure vital signs reviewed and stable Respiratory status: spontaneous breathing, nonlabored ventilation and respiratory function stable Cardiovascular status: blood pressure returned to baseline and stable Postop Assessment: no apparent nausea or vomiting Anesthetic complications: no   No notable events documented.   Last Vitals:  Vitals:   12/15/21 1409 12/15/21 1419  BP: (!) 98/55 95/63  Pulse: 68   Resp: 19   Temp:    SpO2: 96%     Last Pain:  Vitals:   12/15/21 1419  TempSrc:   PainSc: 0-No pain                 Foye Deer

## 2021-12-15 NOTE — Transfer of Care (Signed)
Immediate Anesthesia Transfer of Care Note  Patient: Martha Herrera  Procedure(s) Performed: ESOPHAGOGASTRODUODENOSCOPY (EGD) (Esophagus)  Patient Location: PACU and Endoscopy Unit  Anesthesia Type:General  Level of Consciousness: drowsy  Airway & Oxygen Therapy: Patient Spontanous Breathing  Post-op Assessment: Report given to RN  Post vital signs: stable  Last Vitals:  Vitals Value Taken Time  BP    Temp    Pulse    Resp    SpO2      Last Pain:  Vitals:   12/15/21 1327  PainSc: 0-No pain         Complications: No notable events documented.

## 2021-12-15 NOTE — Op Note (Signed)
[facility] Gastroenterology Patient Name: Martha Herrera Procedure Date: 12/15/2021 1:41 PM MRN: [id number] Account #: [id number] Date of Birth: 03/07/1996 Admit Type: Outpatient Age: 26 Room: [facility] ENDO ROOM 3 Gender: Female Note Status: Finalized Instrument Name: Patton Salles Endoscope [id number] Procedure:             Upper GI endoscopy Indications:           Heartburn Providers:             Arville Go MD, MD Referring MD:          Daryl Eastern. Suzie Portela (Referring MD) Medicines:             Propofol per Anesthesia Complications:         No immediate complications. Procedure:             Pre-Anesthesia Assessment:                        - After reviewing the risks and benefits, the patient                         was deemed in satisfactory condition to undergo the                         procedure in an ambulatory setting.                        After obtaining informed consent, the endoscope was                         passed under direct vision. Throughout the procedure,                         the patient's blood pressure, pulse, and oxygen                         saturations were monitored continuously. The Endoscope                         was introduced through the mouth, and advanced to the                         second part of duodenum. The upper GI endoscopy was                         accomplished without difficulty. The patient tolerated                         the procedure well. Findings:      The esophagus was normal.      The stomach was normal.      The examined duodenum was normal.      The cardia and gastric fundus were normal on retroflexion.      random biopsies obtained for H. pylori testing Impression:            - Normal esophagus.                        - Normal stomach.                        -  Normal examined duodenum. Recommendation:        - Discharge patient to home.                        - Resume previous diet.                        - Written  discharge instructions were provided to the                         patient.                        - Await pathology results. Procedure Code(s):     --- Professional ---                        [phone], Esophagogastroduodenoscopy, flexible,                         transoral; with biopsy, single or multiple Diagnosis Code(s):     --- Professional ---                        R12, Heartburn CPT copyright 2019 American Medical Association. All rights reserved. The codes documented in this report are preliminary and upon coder review may  be revised to meet current compliance requirements. Dr. Harrie Foreman, MD Arville Go MD, MD 12/15/2021 2:05:25 PM This report has been signed electronically. Number of Addenda: 0 Note Initiated On: 12/15/2021 1:41 PM Estimated Blood Loss:  Estimated blood loss was minimal.      [facility]

## 2021-12-15 NOTE — Anesthesia Preprocedure Evaluation (Signed)
Anesthesia Evaluation  Patient identified by MRN, date of birth, ID band Patient awake    Reviewed: Allergy & Precautions, NPO status , Patient's Chart, lab work & pertinent test results  History of Anesthesia Complications Negative for: history of anesthetic complications  Airway Mallampati: III  TM Distance: >3 FB Neck ROM: full    Dental  (+) Teeth Intact   Pulmonary neg pulmonary ROS, neg COPD,    Pulmonary exam normal        Cardiovascular (-) CABG negative cardio ROS Normal cardiovascular exam(-) Valvular Problems/Murmurs     Neuro/Psych PSYCHIATRIC DISORDERS negative neurological ROS     GI/Hepatic negative GI ROS, Neg liver ROS,   Endo/Other  negative endocrine ROSdiabetes  Renal/GU negative Renal ROS  negative genitourinary   Musculoskeletal   Abdominal   Peds  Hematology negative hematology ROS (+)   Anesthesia Other Findings Past Medical History: No date: Complication of anesthesia     Comment:  hard to wake up high level with CS No date: Dysmenorrhea No date: Gastroesophageal reflux     Comment:  takes prescribed meds daily No date: Gestational diabetes No date: Headache(784.0)     Comment:  not as often No date: Heart murmur 2013: Pneumonia No date: Rubella non-immune status, antepartum No date: Tinea capitis No date: Varicella  Past Surgical History: No date: ADENOIDECTOMY 08/07/2018: CESAREAN SECTION; N/A     Comment:  Procedure: CESAREAN SECTION;  Surgeon: Lazaro Arms,               MD;  Location: [facility] LD ORS;  Service: Obstetrics;                Laterality: N/A; 01/20/2021: CESAREAN SECTION; N/A     Comment:  Procedure: CESAREAN SECTION;  Surgeon: Kathrynn Running, MD;  Location: [facility] LD ORS;  Service: Obstetrics;               Laterality: N/A; 01/20/2021: CESAREAN SECTION WITH BILATERAL TUBAL LIGATION; Bilateral 05/03/2012: ESOPHAGOGASTRODUODENOSCOPY     Comment:   Procedure: ESOPHAGOGASTRODUODENOSCOPY (EGD);  Surgeon:               Jon Gills, MD;  Location: [facility] OR;  Service:               Gastroenterology;  Laterality: N/A; No date: tubes in ears No date: WISDOM TOOTH EXTRACTION  BMI    Body Mass Index: 38.67 kg/m      Reproductive/Obstetrics negative OB ROS                            Anesthesia Physical Anesthesia Plan  ASA: 2  Anesthesia Plan: General   Post-op Pain Management: Minimal or no pain anticipated   Induction: Intravenous  PONV Risk Score and Plan: 3 and Propofol infusion, TIVA and Ondansetron  Airway Management Planned: Nasal Cannula  Additional Equipment: None  Intra-op Plan:   Post-operative Plan:   Informed Consent: I have reviewed the patients History and Physical, chart, labs and discussed the procedure including the risks, benefits and alternatives for the proposed anesthesia with the patient or authorized representative who has indicated his/her understanding and acceptance.     Dental advisory given  Plan Discussed with: CRNA and Surgeon  Anesthesia Plan Comments: (Discussed risks of anesthesia with patient, including possibility of difficulty with spontaneous ventilation under anesthesia necessitating airway intervention, PONV,  and rare risks such as cardiac or respiratory or neurological events, and allergic reactions. Discussed the role of CRNA in patient's perioperative care. Patient understands.)        Anesthesia Quick Evaluation

## 2021-12-16 ENCOUNTER — Encounter: Payer: Self-pay | Admitting: Surgery

## 2021-12-16 LAB — SURGICAL PATHOLOGY

## 2021-12-16 NOTE — H&P (Signed)
Subjective:   CC: Epigastric pain [R10.13]  HPI: Martha Herrera is a 26 y.o. female who was referred by Dolores Frame for evaluation of above CC. Symptoms were first noted several weeks ago. Pain is burning , constant, confined to the epigastric area, without radiation. Different from typical "heartburn" symptoms. Associated with nausea, exacerbated by fatty foods.  Recently got worse after taking advil around the clock for two weeks. Any oral intake exacerbates pain at this time.  Past Medical History: has a past medical history of Cardiac murmur, Dysmenorrhea, GERD (gastroesophageal reflux disease), History of gestational diabetes, Migraine without aura, Rubella non-immune status, antepartum, and Tinea capitis.  Past Surgical History: has a past surgical history that includes Adenoidectomy; Tympanostomy tube placement; and other surgery.  Family History: family history includes High blood pressure (Hypertension) in her maternal aunt and maternal grandmother.  Social History: reports that she has never smoked. She has never used smokeless tobacco. She reports that she does not currently use alcohol. No history on file for drug use.  Current Medications: has a current medication list which includes the following prescription(s): loratadine, omeprazole, cyclobenzaprine, fluticasone propionate, promethazine-dextromethorphan, sucralfate, and sumatriptan.  Allergies:  Allergies as of 10/25/2021 - never reviewed  Allergen Reaction Noted  Penicillin g Rash 07/25/2012  Amoxicillin Itching and Rash 01/23/2020   ROS:  A 15 point review of systems was performed and pertinent positives and negatives noted in HPI   Objective:    BP 126/88  Pulse 76  Ht 147.3 cm (4\' 10" )  Wt 84.4 kg (186 lb)  LMP (LMP Unknown) Comment: had tubial  BMI 38.87 kg/m   Constitutional : No distress, cooperative, alert  Lymphatics/Throat: Supple with no lymphadenopathy  Respiratory: Clear to auscultation bilaterally   Cardiovascular: Regular rate and rhythm  Gastrointestinal: Soft, non-tender, non-distended, no organomegaly.  Musculoskeletal: Steady gait and movement  Skin: Cool and moist, no surgical scars  Psychiatric: Normal affect, non-agitated, not confused    LABS:  N/a   RADS: CLINICAL DATA: Epigastric pain for 2 weeks  EXAM: ULTRASOUND ABDOMEN LIMITED RIGHT UPPER QUADRANT  COMPARISON: None Available.  FINDINGS: Gallbladder:  Contracted gallbladder with multiple gallstones. No gallbladder wall thickening or edema. Murphy's sign is negative.  Common bile duct:  Diameter: 3 mm, normal  Liver:  No focal lesion identified. Within normal limits in parenchymal echogenicity. Portal vein is patent on color Doppler imaging with normal direction of blood flow towards the liver.  Other: None.  IMPRESSION: Cholelithiasis. No additional changes to suggest acute cholecystitis.   Electronically Signed By: M.D. On: 10/18/2021 01:12 Assessment:    Epigastric pain [R10.13]  Plan:    1. Epigastric pain [R10.13] location and story more consistent with stomach related issues rather than gallbladder at this point. Will proceed with EGD to evaluate. Carafate in addition to protonix in the meantime.  R/b/a discussed. Risks include bleeding, perforation. Benefits include diagnostic, curative procedure if needed. Alternatives include continued observation. Pt verbalized understanding.  labs/images/medications/previous chart entries reviewed personally and relevant changes/updates noted above

## 2021-12-20 ENCOUNTER — Encounter: Payer: Self-pay | Admitting: Podiatry

## 2021-12-20 ENCOUNTER — Ambulatory Visit: Payer: BC Managed Care – PPO | Admitting: Podiatry

## 2021-12-20 NOTE — Progress Notes (Signed)
Subjective:  Patient ID: Martha Herrera, female    DOB: 1995/10/08,  MRN: [id number]  Chief Complaint  Patient presents with   Ingrown Toenail    26 y.o. female presents with the above complaint.  Patient presents with bilateral border ingrown.  Patient states pain for touch is progressive gotten worse.  She had it removed in the past but seems to be growing back is causing her some pain.  Hurts with ambulation.  She has some mild erythema around it she has doxycycline that she was given by her primary care doctor.  She would like to have removed again.  Denies any other acute complaints.   Review of Systems: Negative except as noted in the HPI. Denies N/V/F/Ch.  Past Medical History:  Diagnosis Date   Complication of anesthesia    hard to wake up high level with CS   Dysmenorrhea    Gastroesophageal reflux    takes prescribed meds daily   Gestational diabetes    Headache(784.0)    not as often   Heart murmur    Pneumonia 2013   Rubella non-immune status, antepartum    Tinea capitis    Varicella     Current Outpatient Medications:    cyclobenzaprine (FLEXERIL) 5 MG tablet, Take 5 mg by mouth 3 (three) times daily as needed for muscle spasms., Disp: , Rfl:    doxycycline (VIBRA-TABS) 100 MG tablet, Take 1 tablet (100 mg total) by mouth 2 (two) times daily., Disp: 14 tablet, Rfl: 0   FLUoxetine (PROZAC) 40 MG capsule, Take 1 capsule (40 mg total) by mouth daily. (Patient not taking: Reported on 06/15/2021), Disp: 90 capsule, Rfl: 3   fluticasone (FLONASE) 50 MCG/ACT nasal spray, Place 2 sprays into both nostrils daily., Disp: 16 g, Rfl: 6   hydrocortisone (ANUSOL-HC) 25 MG suppository, Place 1 suppository (25 mg total) rectally 2 (two) times daily. (Patient not taking: Reported on 08/24/2021), Disp: 12 suppository, Rfl: 0   loratadine (CLARITIN) 10 MG tablet, Take 10 mg by mouth daily., Disp: , Rfl:    omeprazole (PRILOSEC) 40 MG capsule, Take 1 capsule (40 mg total) by mouth daily.,  Disp: 90 capsule, Rfl: 3   ondansetron (ZOFRAN-ODT) 4 MG disintegrating tablet, Take 1 tablet (4 mg total) by mouth every 8 (eight) hours as needed for nausea or vomiting., Disp: 30 tablet, Rfl: 0   oxyCODONE-acetaminophen (PERCOCET/ROXICET) 5-325 MG tablet, Take 1 tablet by mouth every 4 (four) hours as needed for severe pain., Disp: 30 tablet, Rfl: 0   polyethylene glycol powder (GLYCOLAX/MIRALAX) 17 GM/SCOOP powder, Take 17 g by mouth 2 (two) times daily as needed. (Patient not taking: Reported on 08/24/2021), Disp: 850 g, Rfl: 1   promethazine-dextromethorphan (PROMETHAZINE-DM) 6.25-15 MG/5ML syrup, Take 2.5 mLs by mouth 4 (four) times daily as needed for cough., Disp: , Rfl:    sucralfate (CARAFATE) 1 g tablet, Take 1 g by mouth 4 (four) times daily -  with meals and at bedtime., Disp: , Rfl:    SUMAtriptan (IMITREX) 25 MG tablet, Take 25 mg by mouth every 2 (two) hours as needed for migraine. May repeat in 2 hours if headache persists or recurs., Disp: , Rfl:   Social History   Tobacco Use  Smoking Status Never  Smokeless Tobacco Never    Allergies  Allergen Reactions   Penicillins Hives, Itching and Rash    Did it involve swelling of the face/tongue/throat, SOB, or low BP? No Did it involve sudden or severe rash/hives, skin peeling,  or any reaction on the inside of your mouth or nose? No Did you need to seek medical attention at a hospital or doctor's office? No When did it last happen?     childhood allergy  If all above answers are "NO", may proceed with cephalosporin use.    Amoxil [Amoxicillin] Itching and Rash    Did it involve swelling of the face/tongue/throat, SOB, or low BP? No Did it involve sudden or severe rash/hives, skin peeling, or any reaction on the inside of your mouth or nose? No Did you need to seek medical attention at a hospital or doctor's office? No When did it last happen?     2019 If all above answers are "NO", may proceed with cephalosporin use.    Objective:  There were no vitals filed for this visit. There is no height or weight on file to calculate BMI. Constitutional Well developed. Well nourished.  Vascular Dorsalis pedis pulses palpable bilaterally. Posterior tibial pulses palpable bilaterally. Capillary refill normal to all digits.  No cyanosis or clubbing noted. Pedal hair growth normal.  Neurologic Normal speech. Oriented to person, place, and time. Epicritic sensation to light touch grossly present bilaterally.  Dermatologic Painful ingrowing nail at lateral nail borders of the hallux nail right. No other open wounds. No skin lesions.  Orthopedic: Normal joint ROM without pain or crepitus bilaterally. No visible deformities. No bony tenderness.   Radiographs: None Assessment:   1. Ingrowing right great toenail   2. Erythema    Plan:  Patient was evaluated and treated and all questions answered.  Ingrown Nail, right with underlying erythema -Patient elects to proceed with minor surgery to remove ingrown toenail removal today. Consent reviewed and signed by patient. -Ingrown nail excised. See procedure note. -Educated on post-procedure care including soaking. Written instructions provided and reviewed. -Patient to follow up in 2 weeks for nail check. -She is currently on doxycycline I encouraged her to complete the course  Procedure: Excision of Ingrown Toenail Location: Right 1st toe lateral nail borders. Anesthesia: Lidocaine 1% plain; 1.5 mL and Marcaine 0.5% plain; 1.5 mL, digital block. Skin Prep: Betadine. Dressing: Silvadene; telfa; dry, sterile, compression dressing. Technique: Following skin prep, the toe was exsanguinated and a tourniquet was secured at the base of the toe. The affected nail border was freed, split with a nail splitter, and excised. Chemical matrixectomy was then performed with phenol and irrigated out with alcohol. The tourniquet was then removed and sterile dressing  applied. Disposition: Patient tolerated procedure well. Patient to return in 2 weeks for follow-up.   No follow-ups on file.

## 2021-12-22 ENCOUNTER — Other Ambulatory Visit: Payer: Self-pay | Admitting: Family Medicine

## 2021-12-22 ENCOUNTER — Encounter: Payer: Self-pay | Admitting: Family Medicine

## 2022-01-31 ENCOUNTER — Emergency Department
Admission: EM | Admit: 2022-01-31 | Discharge: 2022-01-31 | Disposition: A | Discharge status: Home / Self Care | Payer: BC Managed Care – PPO | Attending: Emergency Medicine | Admitting: Emergency Medicine

## 2022-01-31 ENCOUNTER — Other Ambulatory Visit: Payer: Self-pay

## 2022-01-31 DIAGNOSIS — R9431 Abnormal electrocardiogram [ECG] [EKG]: Secondary | ICD-10-CM | POA: Diagnosis not present

## 2022-01-31 DIAGNOSIS — H538 Other visual disturbances: Secondary | ICD-10-CM | POA: Diagnosis not present

## 2022-01-31 DIAGNOSIS — G43809 Other migraine, not intractable, without status migrainosus: Secondary | ICD-10-CM | POA: Diagnosis not present

## 2022-01-31 MED ORDER — BUTALBITAL-APAP-CAFFEINE 50-325-40 MG PO TABS: 1.0000 | ORAL_TABLET | Freq: Four times a day (QID) | ORAL | 0 refills | Status: DC | PRN

## 2022-01-31 MED ORDER — DEXAMETHASONE SODIUM PHOSPHATE 10 MG/ML IJ SOLN
10.0000 mg | Freq: Once | INTRAMUSCULAR | Status: AC
  Administered 2022-01-31: 10 mg via INTRAVENOUS
  Filled 2022-01-31: qty 1

## 2022-01-31 MED ORDER — KETOROLAC TROMETHAMINE 30 MG/ML IJ SOLN
30.0000 mg | Freq: Once | INTRAMUSCULAR | Status: AC
  Administered 2022-01-31: 30 mg via INTRAVENOUS
  Filled 2022-01-31: qty 1

## 2022-01-31 MED ORDER — MAGNESIUM SULFATE 2 GM/50ML IV SOLN
2.0000 g | Freq: Once | INTRAVENOUS | Status: AC
  Administered 2022-01-31: 2 g via INTRAVENOUS
  Filled 2022-01-31: qty 50

## 2022-01-31 MED ORDER — SODIUM CHLORIDE 0.9 % IV BOLUS
1000.0000 mL | Freq: Once | INTRAVENOUS | Status: AC
  Administered 2022-01-31: 1000 mL via INTRAVENOUS

## 2022-01-31 MED ORDER — DIPHENHYDRAMINE HCL 50 MG/ML IJ SOLN
25.0000 mg | Freq: Once | INTRAMUSCULAR | Status: AC
  Administered 2022-01-31: 25 mg via INTRAVENOUS
  Filled 2022-01-31: qty 1

## 2022-01-31 MED ORDER — METOCLOPRAMIDE HCL 5 MG/ML IJ SOLN
10.0000 mg | Freq: Once | INTRAMUSCULAR | Status: AC
  Administered 2022-01-31: 10 mg via INTRAVENOUS
  Filled 2022-01-31: qty 2

## 2022-01-31 NOTE — ED Triage Notes (Signed)
Pt via POV c/o migraine since around 9am this morning, prior hx of same. Pt states she was at work and developed a strong headache behind her left eye with blurry vision, right-sided facial numbness, and numbness of right arm from elbow to fingers. Pt reports 10/10 headache pain at time of triage with mild nausea and 1 episode of vomiting. No meds PTA.

## 2022-01-31 NOTE — ED Provider Notes (Signed)
[facility] Provider Note   Event Date/Time   First MD Initiated Contact with Patient 01/31/22 2219     (approximate) History  Migraine  HPI Martha Herrera is a 26 y.o. female with a stated past medical history of migraines who presents for a 10/10 headache that has been present since approximately 9 AM this morning with associated pain behind the left eye, blurry vision, right-sided facial numbness, numbness of the right arm, and nausea/vomiting.  Patient is not taking any medications prior to arrival and states that she is not on any medications for migraines at this time.  Patient only sees her primary care provider for migraine treatment and has never seen a neurologist ROS: Patient currently denies any vision changes, tinnitus, difficulty speaking, facial droop, sore throat, chest pain, shortness of breath, abdominal pain, diarrhea, dysuria, or weakness/paresthesias in any extremity   Physical Exam  Triage Vital Signs: ED Triage Vitals  Enc Vitals Group     BP 01/31/22 2121 (!) 124/97     Pulse Rate 01/31/22 2121 73     Resp 01/31/22 2121 14     Temp 01/31/22 2121 97.7 F (36.5 C)     Temp Source 01/31/22 2121 Oral     SpO2 01/31/22 2121 100 %     Weight 01/31/22 2122 185 lb (83.9 kg)     Height 01/31/22 2122 4\' 10"  (1.473 m)     Head Circumference --      Peak Flow --      Pain Score 01/31/22 2121 10     Pain Loc --      Pain Edu? --      Excl. in [place]? --    Most recent vital signs: Vitals:   01/31/22 2121  BP: (!) 124/97  Pulse: 73  Resp: 14  Temp: 97.7 F (36.5 C)  SpO2: 100%   General: Awake, oriented x4. CV:  Good peripheral perfusion.  Resp:  Normal effort.  Abd:  No distention.  Other:  Young adult overweight Caucasian female laying in bed in no acute distress ED Results / Procedures / Treatments   PROCEDURES: Critical Care performed: No .1-3 Lead EKG Interpretation  Performed by: Naaman Plummer, MD Authorized by: Naaman Plummer, MD     Interpretation: normal     ECG rate:  74   ECG rate assessment: normal     Rhythm: sinus rhythm     Ectopy: none     Conduction: normal    MEDICATIONS ORDERED IN ED: Medications  magnesium sulfate IVPB 2 g 50 mL (2 g Intravenous New Bag/Given 01/31/22 2238)  sodium chloride 0.9 % bolus 1,000 mL (1,000 mLs Intravenous New Bag/Given 01/31/22 2233)  ketorolac (TORADOL) 30 MG/ML injection 30 mg (30 mg Intravenous Given 01/31/22 2232)  diphenhydrAMINE (BENADRYL) injection 25 mg (25 mg Intravenous Given 01/31/22 2237)  metoCLOPramide (REGLAN) injection 10 mg (10 mg Intravenous Given 01/31/22 2235)  dexamethasone (DECADRON) injection 10 mg (10 mg Intravenous Given 01/31/22 2234)   IMPRESSION / MDM / ASSESSMENT AND PLAN / ED COURSE  I reviewed the triage vital signs and the nursing notes.                             The patient is on the cardiac monitor to evaluate for evidence of arrhythmia and/or significant heart rate changes. Patient's presentation is most consistent with acute presentation with potential threat to life or  bodily function. Presents with Headache.  No focal neurological symptoms. Neuro exam is benign. Pt is nontoxic. VSS.  Based on history and normal neurological exam I have low suspicion for intracranial tumor, intracranial bleed, meningitis, temporal arteritis, glaucoma, CO poisoning.  Most likely patient has benign headache, recommend rest, hydration, and ibuprofen.  Disposition: Discharge home with strict return precautions and instructions for prompt primary care follow up.   FINAL CLINICAL IMPRESSION(S) / ED DIAGNOSES   Final diagnoses:  Other migraine without status migrainosus, not intractable   Rx / DC Orders   ED Discharge Orders          Ordered    butalbital-acetaminophen-caffeine (FIORICET) 50-325-40 MG tablet  Every 6 hours PRN        01/31/22 2326           Note:  This document was prepared using Dragon voice recognition software  and may include unintentional dictation errors.   Merwyn Katos, MD 01/31/22 [phone]

## 2022-03-01 DIAGNOSIS — Z8619 Personal history of other infectious and parasitic diseases: Secondary | ICD-10-CM | POA: Diagnosis not present

## 2022-04-26 ENCOUNTER — Ambulatory Visit
Admission: EM | Admit: 2022-04-26 | Discharge: 2022-04-26 | Disposition: A | Discharge status: Home / Self Care | Payer: BC Managed Care – PPO | Attending: Nurse Practitioner | Admitting: Nurse Practitioner

## 2022-04-26 DIAGNOSIS — R6889 Other general symptoms and signs: Secondary | ICD-10-CM

## 2022-04-26 DIAGNOSIS — Z20828 Contact with and (suspected) exposure to other viral communicable diseases: Secondary | ICD-10-CM | POA: Diagnosis not present

## 2022-04-26 MED ORDER — OSELTAMIVIR PHOSPHATE 75 MG PO CAPS: 75.0000 mg | ORAL_CAPSULE | Freq: Two times a day (BID) | ORAL | 0 refills | Status: DC

## 2022-04-26 MED ORDER — FLUTICASONE PROPIONATE 50 MCG/ACT NA SUSP: 2.0000 | Freq: Every day | NASAL | 0 refills | Status: DC

## 2022-04-26 NOTE — Discharge Instructions (Addendum)
Take medication as prescribed. Increase fluids and allow for plenty of rest. May continue over-the-counter Tylenol or ibuprofen as needed for pain, fever, or general discomfort. Warm salt water gargles 3-4 times daily while throat symptoms persist. Recommend using a humidifier in the bedroom at nighttime during sleep to help with cough and sleeping elevated on pillows. As discussed, please be advised that a viral infection can persist anywhere from 10 to 14 days.  If your symptoms suddenly worsen before that timeframe or if they extend beyond that time with no improvement, please follow-up with your primary care physician for further evaluation. Follow-up as needed.

## 2022-04-26 NOTE — ED Provider Notes (Signed)
[facility]    CSN: [id number] Arrival date & time: 04/26/22  1124      History   Chief Complaint No chief complaint on file.   HPI Martha Herrera is a 26 y.o. female.   The history is provided by the patient.   The patient presents for complaints of cough, sore throat, left ear pain, sinus pressure, and generalized bodyaches that started over the past 24 hours.  Patient reports that a family member in her home was recently diagnosed with the flu with whom she had close contact.  Patient denies fever, chills, abdominal pain, nausea, vomiting, or diarrhea.  Patient reports that she took a home COVID test that was negative.  She has been taking Tylenol for her symptoms.  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   Acute non-recurrent maxillary sinusitis 08/05/2021   Ear fullness, left 08/05/2021   Iron deficiency anemia secondary to inadequate dietary iron intake 06/09/2021   Annual physical exam 06/08/2021   Gastroesophageal reflux disease with esophagitis without hemorrhage 06/08/2021   Anxiety and depression 06/08/2021   Chronic tension-type headache, intractable 06/08/2021   Slow transit constipation 06/08/2021   Gestational hypertension 01/20/2021   History of bilateral tubal ligation 01/20/2021   S/P cesarean section 08/07/2018   History of gestational diabetes 05/13/2018   Tension headache  01/13/2013   Migraine without aura 01/13/2013   Cardiac murmur 07/25/2012   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   Substernal chest pain 03/12/2012   Gastroesophageal reflux     Past Surgical History:  Procedure Laterality Date   ADENOIDECTOMY     CESAREAN SECTION N/A 08/07/2018   Procedure: CESAREAN SECTION;  Surgeon: Florian Buff, MD;  Location: [facility];  Service: Obstetrics;  Laterality: N/A;   CESAREAN SECTION N/A 01/20/2021   Procedure: CESAREAN SECTION;  Surgeon: Gwynne Edinger, MD;  Location: [facility];  Service: Obstetrics;  Laterality: N/A;   CESAREAN SECTION WITH BILATERAL TUBAL LIGATION Bilateral 01/20/2021   ESOPHAGOGASTRODUODENOSCOPY  05/03/2012   Procedure: ESOPHAGOGASTRODUODENOSCOPY (EGD);  Surgeon: Oletha Blend, MD;  Location: [facility];  Service: Gastroenterology;  Laterality: N/A;   ESOPHAGOGASTRODUODENOSCOPY N/A 12/15/2021   Procedure: ESOPHAGOGASTRODUODENOSCOPY (EGD);  Surgeon: Benjamine Sprague, DO;  Location: [facility];  Service: General;  Laterality: N/A;   tubes in ears     WISDOM TOOTH EXTRACTION      OB History     Gravida  2   Para  2   Term  2   Preterm  0   AB  0   Living  2      SAB  0   IAB  0   Ectopic  0   Multiple  0   Live Births  2        Obstetric Comments  Patient had a tubal  ligation 01/20/21.          Home Medications    Prior to Admission medications   Medication Sig Start Date End Date Taking? Authorizing Provider  fluticasone (FLONASE) 50 MCG/ACT nasal spray Place 2 sprays into both nostrils daily. 04/26/22  Yes Senie Lanese-Warren, Alda Lea, NP  oseltamivir (TAMIFLU) 75 MG capsule Take 1 capsule (75 mg total) by mouth 2 (two) times daily. 04/26/22  Yes Jaydi Bray-Warren, Alda Lea, NP  butalbital-acetaminophen-caffeine (FIORICET) 50-325-40 MG tablet Take 1-2 tablets by mouth every 6 (six) hours as needed for headache. 01/31/22 01/31/23  Naaman Plummer, MD  cyclobenzaprine (FLEXERIL) 5 MG tablet Take 5 mg by mouth 3  (three) times daily as needed for muscle spasms.    [provider]  doxycycline (VIBRA-TABS) 100 MG tablet Take 1 tablet (100 mg total) by mouth 2 (two) times daily. 12/13/21   McDonald, Stephan Minister, DPM  FLUoxetine (PROZAC) 40 MG capsule Take 1 capsule (40 mg total) by mouth daily. Patient not taking: Reported on 06/15/2021 06/08/21   Gwyneth Sprout, FNP  hydrocortisone (ANUSOL-HC) 25 MG suppository Place 1 suppository (25 mg total) rectally 2 (two) times daily. Patient not taking: Reported on 08/24/2021 08/15/21   Gwyneth Sprout, FNP  loratadine (CLARITIN) 10 MG tablet Take 10 mg by mouth daily.    [provider]  omeprazole (PRILOSEC) 40 MG capsule Take 1 capsule (40 mg total) by mouth daily. 06/08/21   Gwyneth Sprout, FNP  ondansetron (ZOFRAN-ODT) 4 MG disintegrating tablet Take 1 tablet (4 mg total) by mouth every 8 (eight) hours as needed for nausea or vomiting. 10/18/21   Paulette Blanch, MD  polyethylene glycol powder (GLYCOLAX/MIRALAX) 17 GM/SCOOP powder Take 17 g by mouth 2 (two) times daily as needed. Patient not taking: Reported on 08/24/2021 08/15/21   Gwyneth Sprout, FNP  promethazine-dextromethorphan (PROMETHAZINE-DM) 6.25-15 MG/5ML syrup Take 2.5 mLs by mouth 4 (four) times daily as needed for cough.    [provider]  sucralfate (CARAFATE) 1 g tablet Take 1 g by mouth 4 (four) times daily -  with meals and at bedtime.    [provider]  SUMAtriptan (IMITREX) 25 MG tablet Take 25 mg by mouth every 2 (two) hours as needed for migraine. May repeat in 2 hours if headache persists or recurs.    [provider]    Family History Family History  Problem Relation Age of Onset   GER disease Maternal Grandmother    Hyperlipidemia Maternal Grandmother    Cancer Maternal Grandfather    Diabetes Maternal Grandfather    Hyperlipidemia Maternal Grandfather    Vision loss Maternal Grandfather    GER disease Father    Cancer Father        kidney    Miscarriages / Stillbirths Mother    Headache Mother    Asthma Brother    Miscarriages / [place] Brother    ADD / ADHD Brother    Asthma Son    GER disease Maternal Aunt    Migraines Maternal Aunt    Anxiety disorder Maternal Aunt    Diabetes Paternal Aunt    Cancer Paternal Uncle        spinal cancer   Ataxia Neg Hx    [place] Neg Hx    Dementia Neg Hx    Mental retardation Neg Hx    Multiple sclerosis Neg Hx    Neurofibromatosis Neg Hx    Neuropathy Neg Hx    Parkinsonism Neg Hx  Seizures Neg Hx    Stroke Neg Hx    Depression Neg Hx    Bipolar disorder Neg Hx     Social History Social History   Tobacco Use   Smoking status: Never   Smokeless tobacco: Never  Vaping Use   Vaping Use: Never used  Substance Use Topics   Alcohol use: No   Drug use: No     Allergies   Penicillins and Amoxil [amoxicillin]   Review of Systems Review of Systems Per HPI  Physical Exam Triage Vital Signs ED Triage Vitals  Enc Vitals Group     BP 04/26/22 1238 134/78     Pulse Rate 04/26/22 1238 75     Resp 04/26/22 1238 16     Temp 04/26/22 1238 98.2 F (36.8 C)     Temp Source 04/26/22 1238 Oral     SpO2 04/26/22 1238 98 %     Weight --      Height --      Head Circumference --      Peak Flow --      Pain Score 04/26/22 1237 0     Pain Loc --      Pain Edu? --      Excl. in [place]? --    No data found.  Updated Vital Signs BP 134/78 (BP Location: Left Arm)   Pulse 75   Temp 98.2 F (36.8 C) (Oral)   Resp 16   SpO2 98%   Breastfeeding No   Visual Acuity Right Eye Distance:   Left Eye Distance:   Bilateral Distance:    Right Eye Near:   Left Eye Near:    Bilateral Near:     Physical Exam Vitals and nursing note reviewed.  Constitutional:      General: She is not in acute distress.    Appearance: Normal appearance.  HENT:     Head: Normocephalic.     Right Ear: Tympanic membrane, ear canal and external ear normal.     Left Ear: Tympanic membrane,  ear canal and external ear normal.     Nose: Congestion present.     Right Turbinates: Enlarged and swollen.     Left Turbinates: Enlarged and swollen.     Right Sinus: No maxillary sinus tenderness or frontal sinus tenderness.     Left Sinus: No maxillary sinus tenderness or frontal sinus tenderness.     Mouth/Throat:     Lips: Pink.     Mouth: Mucous membranes are moist.     Pharynx: Uvula midline. Posterior oropharyngeal erythema present. No pharyngeal swelling.     Tonsils: No tonsillar exudate.  Eyes:     Extraocular Movements: Extraocular movements intact.     Conjunctiva/sclera: Conjunctivae normal.     Pupils: Pupils are equal, round, and reactive to light.  Cardiovascular:     Rate and Rhythm: Normal rate and regular rhythm.     Pulses: Normal pulses.     Heart sounds: Normal heart sounds.  Pulmonary:     Effort: Pulmonary effort is normal. No respiratory distress.     Breath sounds: Normal breath sounds. No stridor. No wheezing, rhonchi or rales.  Abdominal:     General: Bowel sounds are normal.     Palpations: Abdomen is soft.     Tenderness: There is no abdominal tenderness.  Musculoskeletal:     Cervical back: Normal range of motion.  Lymphadenopathy:     Cervical: No cervical adenopathy.  Skin:  General: Skin is warm and dry.  Neurological:     General: No focal deficit present.     Mental Status: She is alert and oriented to person, place, and time.  Psychiatric:        Mood and Affect: Mood normal.        Behavior: Behavior normal.      UC Treatments / Results  Labs (all labs ordered are listed, but only abnormal results are displayed) Labs Reviewed - No data to display  EKG   Radiology No results found.  Procedures Procedures (including critical care time)  Medications Ordered in UC Medications - No data to display  Initial Impression / Assessment and Plan / UC Course  I have reviewed the triage vital signs and the nursing  notes.  Pertinent labs & imaging results that were available during my care of the patient were reviewed by me and considered in my medical decision making (see chart for details).  The patient is well-appearing, she is in no acute distress, vital signs are stable.  Given the patient's recent influenza exposure and current symptoms with a negative COVID test.  Will treat patient with Tamiflu 75 mg.  Will also prescribe fluticasone 50 mcg nasal spray to help with sinus pressure and nasal congestion.  Supportive care recommendations were provided to the patient along with discussing when follow-up may be necessary.  Also discussed viral etiology with the patient.  Patient verbalizes understanding.  All questions were answered.  Patient stable for discharge.  Work note was provided.   Final Clinical Impressions(s) / UC Diagnoses   Final diagnoses:  Flu-like symptoms  Exposure to influenza     Discharge Instructions      Take medication as prescribed. Increase fluids and allow for plenty of rest. May continue over-the-counter Tylenol or ibuprofen as needed for pain, fever, or general discomfort. Warm salt water gargles 3-4 times daily while throat symptoms persist. Recommend using a humidifier in the bedroom at nighttime during sleep to help with cough and sleeping elevated on pillows. As discussed, please be advised that a viral infection can persist anywhere from 10 to 14 days.  If your symptoms suddenly worsen before that timeframe or if they extend beyond that time with no improvement, please follow-up with your primary care physician for further evaluation. Follow-up as needed.     ED Prescriptions     Medication Sig Dispense Auth. Provider   oseltamivir (TAMIFLU) 75 MG capsule Take 1 capsule (75 mg total) by mouth 2 (two) times daily. 10 capsule Hyla Coard-Warren, Sadie Haber, NP   fluticasone (FLONASE) 50 MCG/ACT nasal spray Place 2 sprays into both nostrils daily. 16 g Kyree Fedorko-Warren,  Sadie Haber, NP      PDMP not reviewed this encounter.   Abran Cantor, NP 04/26/22 1312

## 2022-04-26 NOTE — ED Triage Notes (Signed)
Patient presents to UC for cough, sore throat, left ear pain, sinus pressure since yesterday. Taking tylenol. Exposed to flu. Negative at home covid test.  Denies fever.

## 2022-05-02 ENCOUNTER — Encounter: Payer: Self-pay | Admitting: Family Medicine

## 2022-05-03 ENCOUNTER — Other Ambulatory Visit: Payer: Self-pay | Admitting: Family Medicine

## 2022-05-03 DIAGNOSIS — K2100 Gastro-esophageal reflux disease with esophagitis, without bleeding: Secondary | ICD-10-CM

## 2022-05-03 MED ORDER — OMEPRAZOLE 40 MG PO CPDR: 40.0000 mg | DELAYED_RELEASE_CAPSULE | Freq: Every day | ORAL | 3 refills | Status: DC

## 2022-05-10 ENCOUNTER — Encounter: Payer: Self-pay | Admitting: Family Medicine

## 2022-05-10 ENCOUNTER — Ambulatory Visit (INDEPENDENT_AMBULATORY_CARE_PROVIDER_SITE_OTHER): Payer: 59 | Admitting: Family Medicine

## 2022-05-10 VITALS — BP 111/78 | HR 82 | Temp 97.8°F | Ht <= 58 in | Wt 195.0 lb

## 2022-05-10 DIAGNOSIS — G43119 Migraine with aura, intractable, without status migrainosus: Secondary | ICD-10-CM

## 2022-05-10 DIAGNOSIS — H66002 Acute suppurative otitis media without spontaneous rupture of ear drum, left ear: Secondary | ICD-10-CM

## 2022-05-10 DIAGNOSIS — J40 Bronchitis, not specified as acute or chronic: Secondary | ICD-10-CM | POA: Diagnosis not present

## 2022-05-10 MED ORDER — BUTALBITAL-APAP-CAFFEINE 50-325-40 MG PO TABS: 1.0000 | ORAL_TABLET | Freq: Four times a day (QID) | ORAL | 0 refills | Status: DC | PRN

## 2022-05-10 MED ORDER — ALBUTEROL SULFATE HFA 108 (90 BASE) MCG/ACT IN AERS: 2.0000 | INHALATION_SPRAY | Freq: Four times a day (QID) | RESPIRATORY_TRACT | 2 refills | Status: DC | PRN

## 2022-05-10 MED ORDER — DOXYCYCLINE HYCLATE 100 MG PO TABS: 100.0000 mg | ORAL_TABLET | Freq: Two times a day (BID) | ORAL | 0 refills | Status: DC

## 2022-05-10 MED ORDER — FLUTICASONE PROPIONATE 50 MCG/ACT NA SUSP: 2.0000 | Freq: Every day | NASAL | 0 refills | Status: DC

## 2022-05-10 MED ORDER — AZITHROMYCIN 250 MG PO TABS: ORAL_TABLET | ORAL | 0 refills | Status: AC

## 2022-05-10 NOTE — Assessment & Plan Note (Signed)
Acute, self limiting Reports heaviness in chest Denies wheezing or rattle Cough dry, non productive Known flu exposure

## 2022-05-10 NOTE — Assessment & Plan Note (Signed)
L ear with redness, pain, fullness and edema Likely causing tonsil and sinus concerns as well

## 2022-05-10 NOTE — Progress Notes (Signed)
Established patient visit   Patient: Martha Herrera   DOB: 26-Jan-1996   27 y.o. Female  MRN: [id number] Visit Date: 05/10/2022  Today's healthcare provider: Gwyneth Sprout, FNP  Introduced to nurse practitioner role and practice setting.  All questions answered.  Discussed provider/patient relationship and expectations.  Chief Complaint  Patient presents with   Sore Throat   Subjective    URI  Associated symptoms include congestion, ear pain and a sore throat. Associated symptoms comments: Heavy chest.   HPI   Started 2 weeks ago. Seen UC and was treated for the flu. Still having the same symptoms. Sore throat, tightness in chest when taking a deep breathe, and bilateral ear pain/ stuffiness.  Last edited by Clista Bernhardt, CMA on 05/10/2022  1:13 PM.       Medications: Outpatient Medications Prior to Visit  Medication Sig   loratadine (CLARITIN) 10 MG tablet Take 10 mg by mouth daily.   omeprazole (PRILOSEC) 40 MG capsule Take 1 capsule (40 mg total) by mouth daily.   [DISCONTINUED] butalbital-acetaminophen-caffeine (FIORICET) 50-325-40 MG tablet Take 1-2 tablets by mouth every 6 (six) hours as needed for headache.   [DISCONTINUED] doxycycline (VIBRA-TABS) 100 MG tablet Take 1 tablet (100 mg total) by mouth 2 (two) times daily.   [DISCONTINUED] fluticasone (FLONASE) 50 MCG/ACT nasal spray Place 2 sprays into both nostrils daily.   No facility-administered medications prior to visit.    Review of Systems  HENT:  Positive for congestion, ear pain and sore throat.       Objective    BP 111/78   Pulse 82   Temp 97.8 F (36.6 C) (Oral)   Ht 4\' 10"  (1.473 m)   Wt 195 lb (88.5 kg)   SpO2 99%   BMI 40.76 kg/m   Physical Exam Vitals and nursing note reviewed.  Constitutional:      General: She is not in acute distress.    Appearance: Normal appearance. She is obese. She is not ill-appearing, toxic-appearing or diaphoretic.  HENT:     Head: Normocephalic and  atraumatic.     Right Ear: Tympanic membrane and ear canal normal. Tenderness present.     Left Ear: Swelling and tenderness present. A middle ear effusion is present. Tympanic membrane is erythematous.     Nose:     Right Sinus: Maxillary sinus tenderness present. No frontal sinus tenderness.     Left Sinus: Maxillary sinus tenderness present. No frontal sinus tenderness.     Mouth/Throat:     Mouth: Mucous membranes are moist.     Pharynx: Oropharynx is clear.     Tonsils: No tonsillar exudate or tonsillar abscesses. 0 on the right. 1+ on the left.  Cardiovascular:     Rate and Rhythm: Normal rate and regular rhythm.     Pulses: Normal pulses.     Heart sounds: Normal heart sounds. No murmur heard.    No friction rub. No gallop.  Pulmonary:     Effort: Pulmonary effort is normal. No respiratory distress.     Breath sounds: Normal breath sounds. No stridor. No wheezing, rhonchi or rales.  Chest:     Chest wall: No tenderness.  Abdominal:     General: Bowel sounds are normal.     Palpations: Abdomen is soft.  Musculoskeletal:        General: No swelling, tenderness, deformity or signs of injury. Normal range of motion.     Right lower leg: No  edema.     Left lower leg: No edema.  Skin:    General: Skin is warm and dry.     Capillary Refill: Capillary refill takes less than 2 seconds.     Coloration: Skin is not jaundiced or pale.     Findings: No bruising, erythema, lesion or rash.  Neurological:     General: No focal deficit present.     Mental Status: She is alert and oriented to person, place, and time. Mental status is at baseline.     Cranial Nerves: No cranial nerve deficit.     Sensory: No sensory deficit.     Motor: No weakness.     Coordination: Coordination normal.  Psychiatric:        Mood and Affect: Mood normal.        Behavior: Behavior normal.        Thought Content: Thought content normal.        Judgment: Judgment normal.      No results found for any  visits on 05/10/22.  Assessment & Plan     Problem List Items Addressed This Visit       Cardiovascular and Mediastinum   Intractable migraine with aura without status migrainosus    Acute on chronic, wishes to continue abortive medication to assist      Relevant Medications   butalbital-acetaminophen-caffeine (FIORICET) 50-325-40 MG tablet     Respiratory   Bronchitis    Acute, self limiting Reports heaviness in chest Denies wheezing or rattle Cough dry, non productive Known flu exposure      Relevant Medications   albuterol (VENTOLIN HFA) 108 (90 Base) MCG/ACT inhaler     Nervous and Auditory   Non-recurrent acute suppurative otitis media of left ear without spontaneous rupture of tympanic membrane - Primary    L ear with redness, pain, fullness and edema Likely causing tonsil and sinus concerns as well      Relevant Medications   fluticasone (FLONASE) 50 MCG/ACT nasal spray   azithromycin (ZITHROMAX) 250 MG tablet   doxycycline (VIBRA-TABS) 100 MG tablet   albuterol (VENTOLIN HFA) 108 (90 Base) MCG/ACT inhaler   Return if symptoms worsen or fail to improve.     Vonna Kotyk, FNP, have reviewed all documentation for this visit. The documentation on 05/10/22 for the exam, diagnosis, procedures, and orders are all accurate and complete.  Gwyneth Sprout, [place] [phone] (phone) [phone] (fax)  [place]

## 2022-05-10 NOTE — Assessment & Plan Note (Signed)
Acute on chronic, wishes to continue abortive medication to assist

## 2022-05-12 ENCOUNTER — Ambulatory Visit: Payer: Medicaid Other | Admitting: Family Medicine

## 2022-05-14 ENCOUNTER — Emergency Department
Admission: EM | Admit: 2022-05-14 | Discharge: 2022-05-14 | Disposition: A | Discharge status: Home / Self Care | Payer: 59 | Attending: Emergency Medicine | Admitting: Emergency Medicine

## 2022-05-14 ENCOUNTER — Emergency Department: Payer: 59

## 2022-05-14 ENCOUNTER — Other Ambulatory Visit: Payer: Self-pay

## 2022-05-14 DIAGNOSIS — R0789 Other chest pain: Secondary | ICD-10-CM | POA: Diagnosis present

## 2022-05-14 LAB — CBC
HCT: 38.1 % (ref 36.0–46.0)
Hemoglobin: 12.4 g/dL (ref 12.0–15.0)
MCH: 26.3 pg (ref 26.0–34.0)
MCHC: 32.5 g/dL (ref 30.0–36.0)
MCV: 80.9 fL (ref 80.0–100.0)
Platelets: 284 10*3/uL (ref 150–400)
RBC: 4.71 MIL/uL (ref 3.87–5.11)
RDW: 12.8 % (ref 11.5–15.5)
WBC: 8.2 10*3/uL (ref 4.0–10.5)
nRBC: 0 % (ref 0.0–0.2)

## 2022-05-14 LAB — BASIC METABOLIC PANEL
Anion gap: 9 (ref 5–15)
BUN: 25 mg/dL — ABNORMAL HIGH (ref 6–20)
CO2: 27 mmol/L (ref 22–32)
Calcium: 9.2 mg/dL (ref 8.9–10.3)
Chloride: 100 mmol/L (ref 98–111)
Creatinine, Ser: 0.83 mg/dL (ref 0.44–1.00)
GFR, Estimated: >60 mL/min (ref >60)
Glucose, Bld: 94 mg/dL (ref 70–99)
Potassium: 3.4 mmol/L — ABNORMAL LOW (ref 3.5–5.1)
Sodium: 136 mmol/L (ref 135–145)

## 2022-05-14 LAB — TROPONIN I (HIGH SENSITIVITY): Troponin I (High Sensitivity): <2 ng/L (ref <18)

## 2022-05-14 MED ORDER — PREDNISONE 20 MG PO TABS: 40.0000 mg | ORAL_TABLET | Freq: Every day | ORAL | 0 refills | Status: AC

## 2022-05-14 MED ORDER — CYCLOBENZAPRINE HCL 10 MG PO TABS: 10.0000 mg | ORAL_TABLET | Freq: Three times a day (TID) | ORAL | 0 refills | Status: DC | PRN

## 2022-05-14 MED ORDER — NAPROXEN 500 MG PO TBEC: 500.0000 mg | DELAYED_RELEASE_TABLET | Freq: Two times a day (BID) | ORAL | 0 refills | Status: AC

## 2022-05-14 NOTE — ED Provider Notes (Signed)
[facility] Provider Note    Event Date/Time   First MD Initiated Contact with Patient 05/14/22 [phone]     (approximate)   History   Chest Pain   HPI  Martha Herrera is a 27 y.o. female  here with chest pain. Pt reports that over the past day, she has had sharp, consistent, left upper chest pain. This pain radiates down her shoulder to her upper arm and is somewhat positional, worse with movement. Denies any associated major SOB. She has had a cough, cold, and congestion for the last several weeks for which she has been on ABX and albuterol. Denies direct trauma. No leg swelling. No other complaints.       Physical Exam   Triage Vital Signs: ED Triage Vitals  Enc Vitals Group     BP 05/14/22 0050 (!) 136/107     Pulse Rate 05/14/22 0050 74     Resp 05/14/22 0050 16     Temp 05/14/22 0050 98 F (36.7 C)     Temp Source 05/14/22 0050 Oral     SpO2 05/14/22 0050 99 %     Weight 05/14/22 0051 194 lb 14.2 oz (88.4 kg)     Height 05/14/22 0051 4\' 10"  (1.473 m)     Head Circumference --      Peak Flow --      Pain Score 05/14/22 0051 10     Pain Loc --      Pain Edu? --      Excl. in [place]? --     Most recent vital signs: Vitals:   05/14/22 0330 05/14/22 0415  BP: 113/84 116/73  Pulse: 72 82  Resp:  11  Temp:    SpO2: 96% 97%     General: Awake, no distress.  CV:  Good peripheral perfusion. RRR.  Resp:  Normal effort. Lungs clear. Mild TTP over left upper chest wall, no deformity, no rashes/skin lesions. Abd:  No distention.  Other:  No LE edema. No asymmetric leg swelling.   ED Results / Procedures / Treatments   Labs (all labs ordered are listed, but only abnormal results are displayed) Labs Reviewed  BASIC METABOLIC PANEL - Abnormal; Notable for the following components:      Result Value   Potassium 3.4 (*)    BUN 25 (*)    All other components within normal limits  CBC  POC URINE PREG, ED  TROPONIN I (HIGH SENSITIVITY)   TROPONIN I (HIGH SENSITIVITY)     EKG Normal sinus rhythm, VR 78. PR 154, QRS 72, QTc 419. No acute St elevations or depressions. No ischemia or infarct.   RADIOLOGY CXR: Clear, no active disease   I also independently reviewed and agree with radiologist interpretations.   PROCEDURES:  Critical Care performed: No   MEDICATIONS ORDERED IN ED: Medications - No data to display   IMPRESSION / MDM / [place] / ED COURSE  I reviewed the triage vital signs and the nursing notes.                              Differential diagnosis includes, but is not limited to, chest wall pain, PNA, PTX, cervical radicular pain, shoulder MSK pain, anxiety, gerd/gastritis  Patient's presentation is most consistent with acute presentation with potential threat to life or bodily function.  The patient is on the cardiac monitor to evaluate for evidence of arrhythmia  and/or significant heart rate changes  27 yo F with no major PMHx here with atypical left upper chest pain. Pt is HDS and in nAD. EKG nonischemic, trop negative despite sx >24 hours - do not suspect ACS. She is PERC negative and has no signs of DVT or PE. CXR is clear. Abdomen is soft. CBC without leukocytosis or anemia. BMP unremarkable. Will d/c with management for likely MSK chest wall pain in setting of her recent URI/coughing, with steroids for inflammatory component.  FINAL CLINICAL IMPRESSION(S) / ED DIAGNOSES   Final diagnoses:  Chest wall pain     Rx / DC Orders   ED Discharge Orders          Ordered    cyclobenzaprine (FLEXERIL) 10 MG tablet  3 times daily PRN        05/14/22 0427    naproxen (EC NAPROSYN) 500 MG EC tablet  2 times daily with meals        05/14/22 0427    predniSONE (DELTASONE) 20 MG tablet  Daily        05/14/22 0427             Note:  This document was prepared using Dragon voice recognition software and may include unintentional dictation errors.   Duffy Bruce,  MD 05/14/22 [phone]

## 2022-05-14 NOTE — ED Notes (Signed)
Pt also stated she was seen by her PCP 4 days ago after being seen and treated for the flu. Pt has been actively coughing as well.

## 2022-05-14 NOTE — ED Triage Notes (Signed)
Pt to ED from home for CP that was a sudden onset at 11pm tonight. She said that pain started in the center of her chest and radiated down her left arm and now her left arm is all "tingly". Pt is CAOx4 and in no acute distress.Pt ambulatory in triage.

## 2022-05-17 ENCOUNTER — Encounter: Payer: Self-pay | Admitting: Podiatry

## 2022-06-07 ENCOUNTER — Other Ambulatory Visit: Payer: Self-pay | Admitting: Family Medicine

## 2022-06-07 DIAGNOSIS — G43119 Migraine with aura, intractable, without status migrainosus: Secondary | ICD-10-CM

## 2022-06-07 MED ORDER — BUTALBITAL-APAP-CAFFEINE 50-325-40 MG PO TABS: 1.0000 | ORAL_TABLET | Freq: Four times a day (QID) | ORAL | 0 refills | Status: DC | PRN

## 2022-06-14 ENCOUNTER — Encounter: Payer: Self-pay | Admitting: Family Medicine

## 2022-06-14 ENCOUNTER — Ambulatory Visit (INDEPENDENT_AMBULATORY_CARE_PROVIDER_SITE_OTHER): Payer: 59 | Admitting: Family Medicine

## 2022-06-14 VITALS — BP 121/76 | HR 89 | Temp 97.9°F | Ht <= 58 in | Wt 201.0 lb

## 2022-06-14 DIAGNOSIS — G43119 Migraine with aura, intractable, without status migrainosus: Secondary | ICD-10-CM | POA: Diagnosis not present

## 2022-06-14 DIAGNOSIS — J0141 Acute recurrent pansinusitis: Secondary | ICD-10-CM | POA: Insufficient documentation

## 2022-06-14 MED ORDER — CEFIXIME 400 MG PO CAPS: 400.0000 mg | ORAL_CAPSULE | Freq: Every day | ORAL | 0 refills | Status: DC

## 2022-06-14 MED ORDER — BUTALBITAL-APAP-CAFFEINE 50-325-40 MG PO TABS: 1.0000 | ORAL_TABLET | Freq: Every day | ORAL | 1 refills | Status: DC | PRN

## 2022-06-14 MED ORDER — METHYLPREDNISOLONE 4 MG PO TBPK: ORAL_TABLET | ORAL | 0 refills | Status: DC

## 2022-06-14 NOTE — Assessment & Plan Note (Signed)
Chronic, worsening Could be from recent steroid use Continue to monitor Continue to recommend balanced, lower carb meals. Smaller meal size, adding snacks. Choosing water as drink of choice and increasing purposeful exercise. Discussed importance of healthy weight management Body mass index is 42.01 kg/m.

## 2022-06-14 NOTE — Assessment & Plan Note (Signed)
Acute, recurrent Home COVID negative Got relief from previous treatment Recommend ABX change, repeat steroid- given relief following ER with that, referral to ENT and continued use of flonase Kids are sick with similar symptoms  Some things that can make you feel better are: - Increased rest - Increasing Fluids - Acetaminophen / ibuprofen as needed for fever/pain.  - Salt water gargling, chloraseptic spray and throat lozenges - OTC pseudoephedrine.  - Mucinex.  - Saline sinus flushes or a neti pot.  - Humidifying the air.

## 2022-06-14 NOTE — Progress Notes (Signed)
Established patient visit   Patient: Martha Herrera   DOB: [birth year]   27 y.o. Female  MRN: [id number] Visit Date: 06/14/2022  Today's healthcare provider: Gwyneth Sprout, FNP   No chief complaint on file.  Subjective    UPPER RESPIRATORY TRACT INFECTION Worst symptom: Fever: yes Cough: no Shortness of breath: no Wheezing: no Chest pain: yes, with cough Chest tightness: no Chest congestion: yes Nasal congestion: yes Runny nose: yes Post nasal drip: no Sneezing: no Sore throat: no Swollen glands: yes Sinus pressure: yes Headache: yes Face pain: yes Toothache: no Ear pain: yes, bilateral Ear pressure: yes bilateral  Eyes red/itching:no Eye drainage/crusting: no  Vomiting: no Rash: no Fatigue: yes Sick contacts: yes Strep contacts: no  Context: worse Recurrent sinusitis: yes Relief with OTC cold/cough medications: no  Treatments attempted: APAP    HPI   Pt stated--left ear is worse right pain, fever,migraine, sore throat, cough--1 week. COVID test-negative. Tried tylenol Last edited by Elta Guadeloupe, CMA on 06/14/2022  4:14 PM.      Medications: Outpatient Medications Prior to Visit  Medication Sig   albuterol (VENTOLIN HFA) 108 (90 Base) MCG/ACT inhaler Inhale 2 puffs into the lungs every 6 (six) hours as needed for wheezing or shortness of breath.   cyclobenzaprine (FLEXERIL) 10 MG tablet Take 1 tablet (10 mg total) by mouth 3 (three) times daily as needed for muscle spasms.   doxycycline (VIBRA-TABS) 100 MG tablet Take 1 tablet (100 mg total) by mouth 2 (two) times daily.   fluticasone (FLONASE) 50 MCG/ACT nasal spray Place 2 sprays into both nostrils daily.   loratadine (CLARITIN) 10 MG tablet Take 10 mg by mouth daily.   omeprazole (PRILOSEC) 40 MG capsule Take 1 capsule (40 mg total) by mouth daily.   [DISCONTINUED] butalbital-acetaminophen-caffeine (FIORICET) 50-325-40 MG tablet Take 1-2 tablets by mouth every 6 (six) hours as needed for headache.    No facility-administered medications prior to visit.    Review of Systems    Objective    BP 121/76 (BP Location: Left Arm, Patient Position: Sitting, Cuff Size: Normal)   Pulse 89   Temp 97.9 F (36.6 C)   Ht 4' 10"$  (1.473 m)   Wt 201 lb (91.2 kg)   SpO2 98%   BMI 42.01 kg/m   Physical Exam Vitals and nursing note reviewed.  Constitutional:      General: She is not in acute distress.    Appearance: Normal appearance. She is obese. She is not ill-appearing, toxic-appearing or diaphoretic.  HENT:     Head: Normocephalic and atraumatic.     Right Ear: Hearing, tympanic membrane, ear canal and external ear normal. Swelling present.     Left Ear: Hearing, tympanic membrane, ear canal and external ear normal. Swelling present.     Nose:     Right Turbinates: Swollen.     Left Turbinates: Swollen.     Right Sinus: Maxillary sinus tenderness and frontal sinus tenderness present.     Left Sinus: Maxillary sinus tenderness and frontal sinus tenderness present.     Mouth/Throat:     Lips: Pink.     Mouth: Mucous membranes are moist.  Cardiovascular:     Rate and Rhythm: Normal rate and regular rhythm.     Pulses: Normal pulses.     Heart sounds: Normal heart sounds. No murmur heard.    No friction rub. No gallop.  Pulmonary:     Effort: Pulmonary effort is normal.  No respiratory distress.     Breath sounds: Normal breath sounds. No stridor. No wheezing, rhonchi or rales.  Chest:     Chest wall: No tenderness.  Abdominal:     General: Bowel sounds are normal.     Palpations: Abdomen is soft.  Musculoskeletal:        General: No swelling, tenderness, deformity or signs of injury. Normal range of motion.     Cervical back: Normal range of motion and neck supple.     Right lower leg: No edema.     Left lower leg: No edema.  Skin:    General: Skin is warm and dry.     Capillary Refill: Capillary refill takes less than 2 seconds.     Coloration: Skin is not jaundiced or  pale.     Findings: No bruising, erythema, lesion or rash.  Neurological:     General: No focal deficit present.     Mental Status: She is alert and oriented to person, place, and time. Mental status is at baseline.     Cranial Nerves: No cranial nerve deficit.     Sensory: No sensory deficit.     Motor: No weakness.     Coordination: Coordination normal.  Psychiatric:        Mood and Affect: Mood normal.        Behavior: Behavior normal.        Thought Content: Thought content normal.        Judgment: Judgment normal.      No results found for any visits on 06/14/22.  Assessment & Plan     Problem List Items Addressed This Visit       Cardiovascular and Mediastinum   Intractable migraine with aura without status migrainosus    Chronic, stable Request for refills       Relevant Medications   butalbital-acetaminophen-caffeine (FIORICET) 50-325-40 MG tablet     Respiratory   Acute recurrent pansinusitis - Primary    Acute, recurrent Home COVID negative Got relief from previous treatment Recommend ABX change, repeat steroid- given relief following ER with that, referral to ENT and continued use of flonase Kids are sick with similar symptoms  Some things that can make you feel better are: - Increased rest - Increasing Fluids - Acetaminophen / ibuprofen as needed for fever/pain.  - Salt water gargling, chloraseptic spray and throat lozenges - OTC pseudoephedrine.  - Mucinex.  - Saline sinus flushes or a neti pot.  - Humidifying the air.       Relevant Medications   cefixime (SUPRAX) 400 MG CAPS capsule   methylPREDNISolone (MEDROL DOSEPAK) 4 MG TBPK tablet   Other Relevant Orders   Ambulatory referral to ENT     Other   Morbid obesity ([place])    Chronic, worsening Could be from recent steroid use Continue to monitor Continue to recommend balanced, lower carb meals. Smaller meal size, adding snacks. Choosing water as drink of choice and increasing purposeful  exercise. Discussed importance of healthy weight management Body mass index is 42.01 kg/m.        Return if symptoms worsen or fail to improve.     Vonna Kotyk, FNP, have reviewed all documentation for this visit. The documentation on 06/14/22 for the exam, diagnosis, procedures, and orders are all accurate and complete.  Gwyneth Sprout, [place] [phone] (phone) [phone] (fax)  [place]

## 2022-06-14 NOTE — Assessment & Plan Note (Signed)
Chronic, stable Request for refills

## 2022-06-15 ENCOUNTER — Other Ambulatory Visit: Payer: Self-pay | Admitting: Family Medicine

## 2022-06-15 DIAGNOSIS — H66002 Acute suppurative otitis media without spontaneous rupture of ear drum, left ear: Secondary | ICD-10-CM

## 2022-06-15 MED ORDER — DOXYCYCLINE HYCLATE 100 MG PO TABS: 100.0000 mg | ORAL_TABLET | Freq: Two times a day (BID) | ORAL | 0 refills | Status: DC

## 2022-06-15 MED ORDER — AZITHROMYCIN 250 MG PO TABS: ORAL_TABLET | ORAL | 0 refills | Status: DC

## 2022-07-03 ENCOUNTER — Encounter: Payer: Self-pay | Admitting: Family Medicine

## 2022-07-04 ENCOUNTER — Emergency Department
Admission: EM | Admit: 2022-07-04 | Discharge: 2022-07-04 | Disposition: A | Discharge status: Home / Self Care | Payer: 59 | Attending: Emergency Medicine | Admitting: Emergency Medicine

## 2022-07-04 ENCOUNTER — Other Ambulatory Visit: Payer: Self-pay

## 2022-07-04 DIAGNOSIS — M25511 Pain in right shoulder: Secondary | ICD-10-CM | POA: Diagnosis present

## 2022-07-04 DIAGNOSIS — X501XXA Overexertion from prolonged static or awkward postures, initial encounter: Secondary | ICD-10-CM | POA: Diagnosis not present

## 2022-07-04 DIAGNOSIS — S46911A Strain of unspecified muscle, fascia and tendon at shoulder and upper arm level, right arm, initial encounter: Secondary | ICD-10-CM | POA: Diagnosis not present

## 2022-07-04 MED ORDER — METHOCARBAMOL 500 MG PO TABS: 500.0000 mg | ORAL_TABLET | Freq: Three times a day (TID) | ORAL | 1 refills | Status: DC | PRN

## 2022-07-04 MED ORDER — NAPROXEN 500 MG PO TABS: 500.0000 mg | ORAL_TABLET | Freq: Two times a day (BID) | ORAL | 2 refills | Status: DC

## 2022-07-04 MED ORDER — KETOROLAC TROMETHAMINE 30 MG/ML IJ SOLN
30.0000 mg | Freq: Once | INTRAMUSCULAR | Status: AC
  Administered 2022-07-04: 30 mg via INTRAMUSCULAR
  Filled 2022-07-04: qty 1

## 2022-07-04 NOTE — ED Provider Notes (Signed)
   [facility] Provider Note    Event Date/Time   First MD Initiated Contact with Patient 07/04/22 1010     (approximate)   History   Shoulder Pain   HPI  Martha Herrera is a 27 y.o. female who presents with complaints of right shoulder pain.  Patient reports has been hurting for approximately 2 months, she thinks may be related to overuse from lifting her baby.  Painful to lie on her right side at night, occasionally pain radiates into her upper arm.  No neck pain     Physical Exam   Triage Vital Signs: ED Triage Vitals  Enc Vitals Group     BP 07/04/22 0955 (!) 134/97     Pulse Rate 07/04/22 0955 71     Resp 07/04/22 0955 18     Temp 07/04/22 0955 98 F (36.7 C)     Temp src --      SpO2 07/04/22 0955 99 %     Weight --      Height --      Head Circumference --      Peak Flow --      Pain Score 07/04/22 0954 10     Pain Loc --      Pain Edu? --      Excl. in [place]? --     Most recent vital signs: Vitals:   07/04/22 0955  BP: (!) 134/97  Pulse: 71  Resp: 18  Temp: 98 F (36.7 C)  SpO2: 99%     General: Awake, no distress.  CV:  Good peripheral perfusion.  Resp:  Normal effort.  Abd:  No distention.  Other:  Tenderness to palpation along the anterior shoulder, pain with reaching arm inferiorly towards her back in particular, strength is normal, warm and well-perfused, no swelling   ED Results / Procedures / Treatments   Labs (all labs ordered are listed, but only abnormal results are displayed) Labs Reviewed - No data to display   EKG     RADIOLOGY     PROCEDURES:  Critical Care performed:   Procedures   MEDICATIONS ORDERED IN ED: Medications  ketorolac (TORADOL) 30 MG/ML injection 30 mg (has no administration in time range)     IMPRESSION / MDM / ASSESSMENT AND PLAN / ED COURSE  I reviewed the triage vital signs and the nursing notes. Patient's presentation is most consistent with acute complicated illness  / injury requiring diagnostic workup.  Patient presents with shoulder pain as above, exam is most consistent with likely rotator cuff injury, strain versus tendinitis.  Will treat with NSAIDs, muscle relaxer, outpatient follow-up with orthopedics for likely MRI further workup        FINAL CLINICAL IMPRESSION(S) / ED DIAGNOSES   Final diagnoses:  Strain of right shoulder, initial encounter     Rx / DC Orders   ED Discharge Orders          Ordered    naproxen (NAPROSYN) 500 MG tablet  2 times daily with meals        07/04/22 1021    methocarbamol (ROBAXIN) 500 MG tablet  Every 8 hours PRN        07/04/22 1021             Note:  This document was prepared using Dragon voice recognition software and may include unintentional dictation errors.   Lavonia Drafts, MD 07/04/22 1030

## 2022-07-04 NOTE — ED Triage Notes (Signed)
Pt comes with c/o right shoulder pain for months now. Pt state she thought she injured it from just picking up daughter too much but it has continued.

## 2022-07-07 ENCOUNTER — Ambulatory Visit: Payer: 59 | Admitting: Family Medicine

## 2022-07-07 NOTE — Progress Notes (Deleted)
      Established patient visit   Patient: Martha Herrera   DOB: [birth year]   27 y.o. Female  MRN: [id number] Visit Date: 07/07/2022  Today's healthcare provider: Gwyneth Sprout, FNP   No chief complaint on file.  Subjective    HPI  Follow up ER visit  Patient was seen in ER for strain of right shoulder on 07/04/2022. Treatment for this included treat with NSAIDs, muscle relaxer, outpatient follow-up with orthopedics for likely MRI further workup . -----------------------------------------------------------------------------------------   Medications: Outpatient Medications Prior to Visit  Medication Sig   albuterol (VENTOLIN HFA) 108 (90 Base) MCG/ACT inhaler Inhale 2 puffs into the lungs every 6 (six) hours as needed for wheezing or shortness of breath.   azithromycin (ZITHROMAX Z-PAK) 250 MG tablet 500 mg PO 1st day, 250 mg PO Days 2-5   butalbital-acetaminophen-caffeine (FIORICET) 50-325-40 MG tablet Take 1-2 tablets by mouth daily as needed for headache.   cyclobenzaprine (FLEXERIL) 10 MG tablet Take 1 tablet (10 mg total) by mouth 3 (three) times daily as needed for muscle spasms.   doxycycline (VIBRA-TABS) 100 MG tablet Take 1 tablet (100 mg total) by mouth 2 (two) times daily.   fluticasone (FLONASE) 50 MCG/ACT nasal spray Place 2 sprays into both nostrils daily.   loratadine (CLARITIN) 10 MG tablet Take 10 mg by mouth daily.   methocarbamol (ROBAXIN) 500 MG tablet Take 1 tablet (500 mg total) by mouth every 8 (eight) hours as needed for muscle spasms.   methylPREDNISolone (MEDROL DOSEPAK) 4 MG TBPK tablet Take as directed on package   naproxen (NAPROSYN) 500 MG tablet Take 1 tablet (500 mg total) by mouth 2 (two) times daily with a meal.   omeprazole (PRILOSEC) 40 MG capsule Take 1 capsule (40 mg total) by mouth daily.   No facility-administered medications prior to visit.    Review of Systems  {Labs  Heme  Chem  Endocrine  Serology  Results Review  (optional):23779}   Objective    There were no vitals taken for this visit. {Show previous vital signs (optional):23777}  Physical Exam  ***  No results found for any visits on 07/07/22.  Assessment & Plan     ***  No follow-ups on file.      {provider attestation***:1}   Gwyneth Sprout, [place] [phone] (phone) [phone] (fax)  [place]

## 2022-07-19 ENCOUNTER — Encounter: Payer: Self-pay | Admitting: Family Medicine

## 2022-07-20 IMAGING — CR DG CHEST 2V
2 series · 2 of 2 positions shown · non-contrast
Comparison: None.

CLINICAL DATA: Chest pain

EXAM:
CHEST - 2 VIEW

[chest pa]
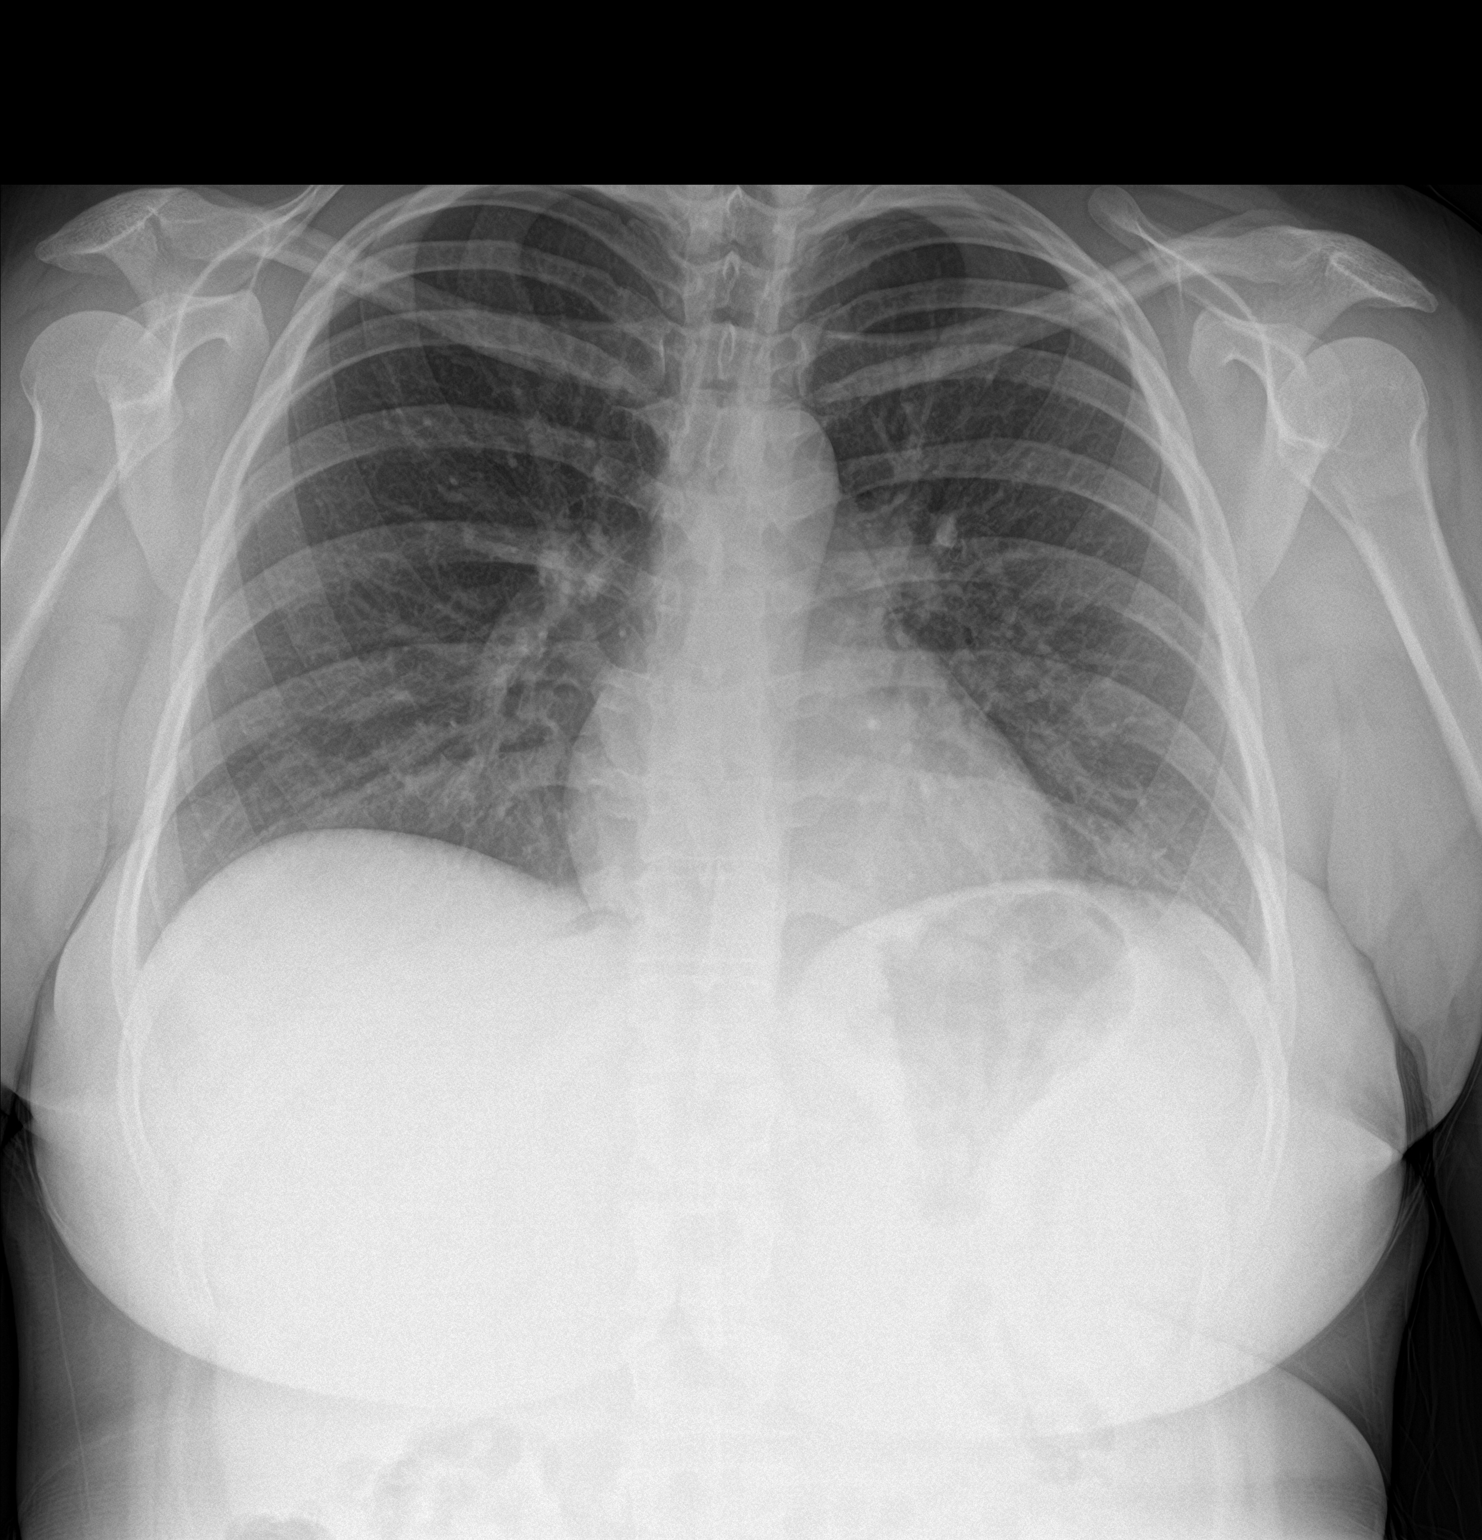

[chest lat]
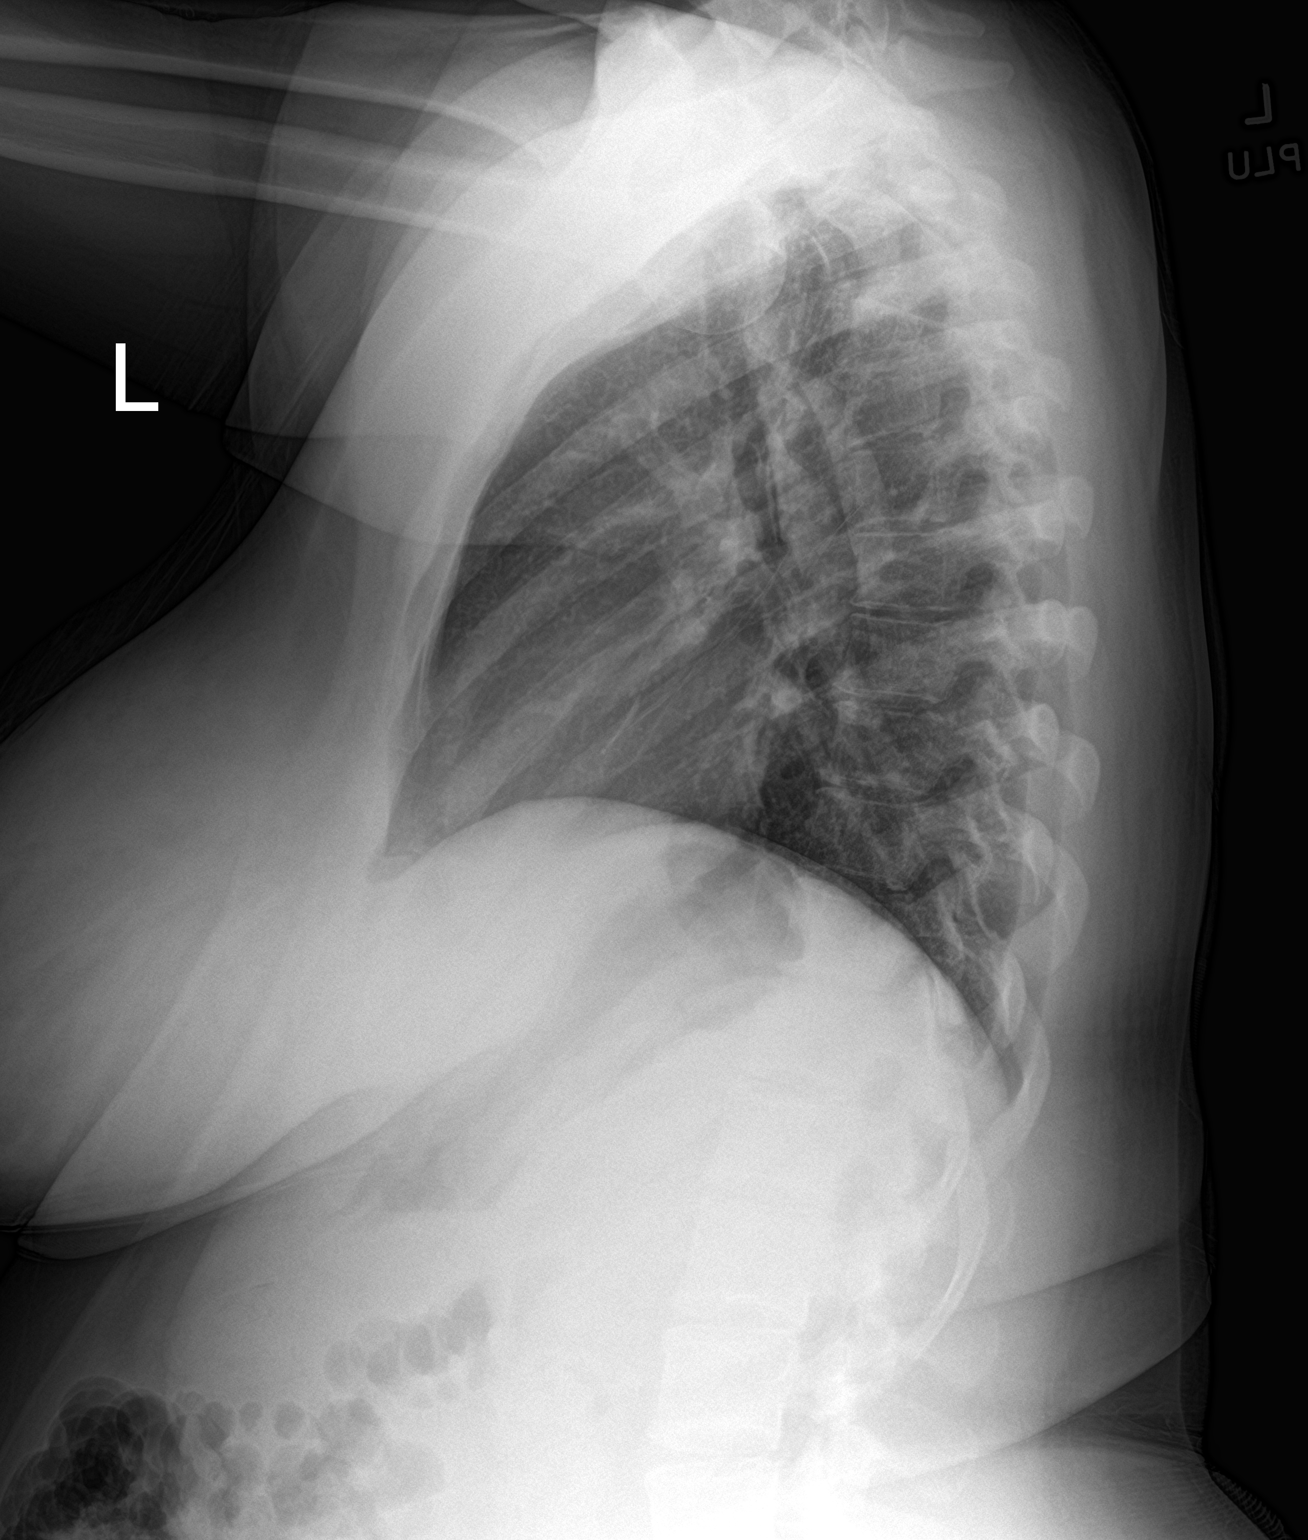

[2 of 2 positions shown; findings below may reference images not displayed]

FINDINGS: The heart size and mediastinal contours are within normal limits.
Both lungs are clear. The visualized skeletal structures are
unremarkable.
IMPRESSION: No active cardiopulmonary disease.

## 2022-08-07 ENCOUNTER — Encounter: Payer: 59 | Admitting: Physician Assistant

## 2022-08-10 NOTE — Progress Notes (Signed)
I,J'ya E Hunter,acting as a scribe for Martha Kindle, FNP.,have documented all relevant documentation on the behalf of Martha Kindle, FNP,as directed by  Martha Kindle, FNP while in the presence of Martha Kindle, FNP.   Complete physical exam   Patient: Martha Herrera   DOB: [birth year]   27 y.o. Female  MRN: [id number] Visit Date: 08/14/2022  Today's healthcare provider: [name], FNP   Chief Complaint  Patient presents with   Annual Exam   Subjective    Martha Herrera is a 27 y.o. female who presents today for a complete physical exam.  She reports consuming a general diet. She walks for exercise. She generally feels well. She reports sleeping poorly. Patient reports that with OTC medications she still has difficulty sleeping.  She does have additional problems to discuss today.   Complains of numbness and tingling sensation in her feet and says that she gets a headache and abdominal discomfort after eating sweets. Complains of ingrown toenail on left foot. Plans to address with both GI and with podiatry. Wishes to continue prilosec 40 mg. HPI   Past Medical History:  Diagnosis Date   Complication of anesthesia    hard to wake up high level with CS   Dysmenorrhea    Gastroesophageal reflux    takes prescribed meds daily   Gestational diabetes    Headache(784.0)    not as often   Heart murmur    Pneumonia 2013   Rubella non-immune status, antepartum    Tinea capitis    Varicella    Past Surgical History:  Procedure Laterality Date   ADENOIDECTOMY     CESAREAN SECTION N/A 08/07/2018   Procedure: CESAREAN SECTION;  Surgeon: Lazaro Arms, MD;  Location: [facility];  Service: Obstetrics;  Laterality: N/A;   CESAREAN SECTION N/A 01/20/2021   Procedure: CESAREAN SECTION;  Surgeon: Kathrynn Running, MD;  Location: [facility];  Service: Obstetrics;  Laterality: N/A;   CESAREAN SECTION WITH BILATERAL TUBAL LIGATION Bilateral 01/20/2021   ESOPHAGOGASTRODUODENOSCOPY   05/03/2012   Procedure: ESOPHAGOGASTRODUODENOSCOPY (EGD);  Surgeon: Jon Gills, MD;  [facility];  Service: Gastroenterology;  Laterality: N/A;   ESOPHAGOGASTRODUODENOSCOPY N/A 12/15/2021   Procedure: ESOPHAGOGASTRODUODENOSCOPY (EGD);  Surgeon: Sung Amabile, DO;  Location: [facility] ENDOSCOPY;  Service: General;  Laterality: N/A;   tubes in ears     WISDOM TOOTH EXTRACTION     Social History   Socioeconomic History   Marital status: Married    Spouse name: Rutha Kirschbaum   Number of children: 1   Years of education: Not on file   Highest education level: Associate degree: academic program  Occupational History   Not on file  Tobacco Use   Smoking status: Never   Smokeless tobacco: Never  Vaping Use   Vaping Use: Never used  Substance and Sexual Activity   Alcohol use: No   Drug use: No   Sexual activity: Yes    Birth control/protection: Surgical    Comment: tubal  Other Topics Concern   Not on file  Social History Narrative   10th grade   Social Determinants of Health   Financial Resource Strain: Low Risk  (02/18/2021)   Overall Financial Resource Strain (CARDIA)    Difficulty of Paying Living Expenses: Not hard at all  Food Insecurity: No Food Insecurity (06/15/2021)   Hunger Vital Sign    Worried About Running Out of Food in the Last Year: Never  true    Ran Out of Food in the Last Year: Never true  Transportation Needs: No Transportation Needs (06/15/2021)   PRAPARE - Administrator, Civil ServiceTransportation    Lack of Transportation (Medical): No    Lack of Transportation (Non-Medical): No  Physical Activity: Sufficiently Active (02/18/2021)   Exercise Vital Sign    Days of Exercise per Week: 7 days    Minutes of Exercise per Session: 100 min  Stress: Stress Concern Present (06/15/2021)   [employer] - Occupational Stress Questionnaire    Feeling of Stress : Rather much  Social Connections: Moderately Isolated (02/18/2021)   Social Connection and Isolation Panel  [NHANES]    Frequency of Communication with Friends and Family: More than three times a week    Frequency of Social Gatherings with Friends and Family: Three times a week    Attends Religious Services: Never    Active Member of Clubs or Organizations: No    Attends BankerClub or Organization Meetings: Never    Marital Status: Married  Catering managerntimate Partner Violence: Not At Risk (02/18/2021)   Humiliation, Afraid, Rape, and Kick questionnaire    Fear of Current or Ex-Partner: No    Emotionally Abused: No    Physically Abused: No    Sexually Abused: No   Family Status  Relation Name Status   PGF  Deceased   PGM  Deceased   MGM  Alive   MGF  Deceased at age 27       Lung Cancer   Father  Alive   Mother  Alive   Brother 607 yo Alive   Brother 27 yo Alive   Sister 20 Alive   Son  Presenter, broadcastingAlive   Mat Aunt  Alive   Mat Uncle  Alive   Pat Aunt  Alive   Nutritional therapistat Uncle x3 Alive   Daughter  Alive   Neg Hx  (Not Specified)   Family History  Problem Relation Age of Onset   GER disease Maternal Grandmother    Hyperlipidemia Maternal Grandmother    Cancer Maternal Grandfather    Diabetes Maternal Grandfather    Hyperlipidemia Maternal Grandfather    Vision loss Maternal Grandfather    GER disease Father    Cancer Father        kidney   Miscarriages / Stillbirths Mother    Headache Mother    Asthma Brother    Miscarriages / [place] Brother    ADD / ADHD Brother    Asthma Son    GER disease Maternal Aunt    Migraines Maternal Aunt    Anxiety disorder Maternal Aunt    Diabetes Paternal Aunt    Cancer Paternal Uncle        spinal cancer   Ataxia Neg Hx    Chorea Neg Hx    Dementia Neg Hx    Mental retardation Neg Hx    Multiple sclerosis Neg Hx    Neurofibromatosis Neg Hx    Neuropathy Neg Hx    Parkinsonism Neg Hx    Seizures Neg Hx    Stroke Neg Hx    Depression Neg Hx    Bipolar disorder Neg Hx    Allergies  Allergen Reactions   Penicillins Hives, Itching and Rash    Did it involve  swelling of the face/tongue/throat, SOB, or low BP? No Did it involve sudden or severe rash/hives, skin peeling, or any reaction on the inside of your mouth or nose? No Did you need to seek medical  attention at a hospital or doctor's office? No When did it last happen?     childhood allergy  If all above answers are "NO", may proceed with cephalosporin use.    Amoxil [Amoxicillin] Itching and Rash    Did it involve swelling of the face/tongue/throat, SOB, or low BP? No Did it involve sudden or severe rash/hives, skin peeling, or any reaction on the inside of your mouth or nose? No Did you need to seek medical attention at a hospital or doctor's office? No When did it last happen?     2019 If all above answers are "NO", may proceed with cephalosporin use.    Patient Care Team: Martha Kindle, FNP as PCP - General (Family Medicine)   Medications: Outpatient Medications Prior to Visit  Medication Sig   albuterol (VENTOLIN HFA) 108 (90 Base) MCG/ACT inhaler Inhale 2 puffs into the lungs every 6 (six) hours as needed for wheezing or shortness of breath.   butalbital-acetaminophen-caffeine (FIORICET) 50-325-40 MG tablet Take 1-2 tablets by mouth daily as needed for headache.   fluticasone (FLONASE) 50 MCG/ACT nasal spray Place 2 sprays into both nostrils daily.   loratadine (CLARITIN) 10 MG tablet Take 10 mg by mouth daily.   omeprazole (PRILOSEC) 40 MG capsule Take 1 capsule (40 mg total) by mouth daily.   [DISCONTINUED] cyclobenzaprine (FLEXERIL) 10 MG tablet Take 1 tablet (10 mg total) by mouth 3 (three) times daily as needed for muscle spasms.   [DISCONTINUED] azithromycin (ZITHROMAX Z-PAK) 250 MG tablet 500 mg PO 1st day, 250 mg PO Days 2-5 (Patient not taking: Reported on 08/14/2022)   [DISCONTINUED] doxycycline (VIBRA-TABS) 100 MG tablet Take 1 tablet (100 mg total) by mouth 2 (two) times daily. (Patient not taking: Reported on 08/14/2022)   [DISCONTINUED] methocarbamol (ROBAXIN) 500 MG  tablet Take 1 tablet (500 mg total) by mouth every 8 (eight) hours as needed for muscle spasms. (Patient not taking: Reported on 08/14/2022)   [DISCONTINUED] methylPREDNISolone (MEDROL DOSEPAK) 4 MG TBPK tablet Take as directed on package (Patient not taking: Reported on 08/14/2022)   [DISCONTINUED] naproxen (NAPROSYN) 500 MG tablet Take 1 tablet (500 mg total) by mouth 2 (two) times daily with a meal. (Patient not taking: Reported on 08/14/2022)   [DISCONTINUED] traMADol (ULTRAM) 50 MG tablet Take by mouth every 6 (six) hours as needed for moderate pain. (Patient not taking: Reported on 08/14/2022)   No facility-administered medications prior to visit.    Review of Systems  Eyes:  Positive for photophobia and itching.  Endocrine: Positive for polydipsia and polyuria.  Genitourinary:  Positive for frequency.  Musculoskeletal:  Positive for arthralgias and back pain.  Allergic/Immunologic: Positive for environmental allergies.  Neurological:  Positive for dizziness, numbness and headaches.    Objective    BP 115/80 (BP Location: Right Arm, Patient Position: Sitting, Cuff Size: Large)   Pulse 81   Temp 98.4 F (36.9 C) (Oral)   Ht 4\' 10"  (1.473 m)   Wt 201 lb (91.2 kg)   LMP 08/03/2022 (Approximate)   SpO2 97%   BMI 42.01 kg/m   Physical Exam Vitals and nursing note reviewed.  Constitutional:      General: She is awake. She is not in acute distress.    Appearance: Normal appearance. She is well-developed and well-groomed. She is obese. She is not ill-appearing, toxic-appearing or diaphoretic.  HENT:     Head: Normocephalic and atraumatic.     Jaw: There is normal jaw occlusion. No trismus, tenderness, swelling or  pain on movement.     Right Ear: Hearing, tympanic membrane, ear canal and external ear normal. There is no impacted cerumen.     Left Ear: Hearing, tympanic membrane, ear canal and external ear normal. There is no impacted cerumen.     Nose: Nose normal. No congestion or  rhinorrhea.     Right Turbinates: Not enlarged, swollen or pale.     Left Turbinates: Not enlarged, swollen or pale.     Right Sinus: No maxillary sinus tenderness or frontal sinus tenderness.     Left Sinus: No maxillary sinus tenderness or frontal sinus tenderness.     Mouth/Throat:     Lips: Pink.     Mouth: Mucous membranes are moist. No injury.     Tongue: No lesions.     Pharynx: Oropharynx is clear. Uvula midline. No pharyngeal swelling, oropharyngeal exudate, posterior oropharyngeal erythema or uvula swelling.     Tonsils: No tonsillar exudate or tonsillar abscesses.  Eyes:     General: Lids are normal. Lids are everted, no foreign bodies appreciated. Vision grossly intact. Gaze aligned appropriately. No allergic shiner or visual field deficit.       Right eye: No discharge.        Left eye: No discharge.     Extraocular Movements: Extraocular movements intact.     Conjunctiva/sclera: Conjunctivae normal.     Right eye: Right conjunctiva is not injected. No exudate.    Left eye: Left conjunctiva is not injected. No exudate.    Pupils: Pupils are equal, round, and reactive to light.  Neck:     Thyroid: No thyroid mass, thyromegaly or thyroid tenderness.     Vascular: No carotid bruit.     Trachea: Trachea normal.  Cardiovascular:     Rate and Rhythm: Normal rate and regular rhythm.     Pulses: Normal pulses.          Carotid pulses are 2+ on the right side and 2+ on the left side.      Radial pulses are 2+ on the right side and 2+ on the left side.       Dorsalis pedis pulses are 2+ on the right side and 2+ on the left side.       Posterior tibial pulses are 2+ on the right side and 2+ on the left side.     Heart sounds: Normal heart sounds, S1 normal and S2 normal. No murmur heard.    No friction rub. No gallop.  Pulmonary:     Effort: Pulmonary effort is normal. No respiratory distress.     Breath sounds: Normal breath sounds and air entry. No stridor. No wheezing, rhonchi  or rales.  Chest:     Chest wall: No tenderness.  Abdominal:     General: Abdomen is flat. Bowel sounds are normal. There is no distension.     Palpations: Abdomen is soft. There is no mass.     Tenderness: There is no abdominal tenderness. There is no right CVA tenderness, left CVA tenderness, guarding or rebound.     Hernia: No hernia is present.  Genitourinary:    Comments: Exam deferred; denies complaints Musculoskeletal:        General: No swelling, tenderness, deformity or signs of injury. Normal range of motion.     Cervical back: Full passive range of motion without pain, normal range of motion and neck supple. No edema, rigidity or tenderness. No muscular tenderness.     Right lower leg:  No edema.     Left lower leg: No edema.  Lymphadenopathy:     Cervical: No cervical adenopathy.     Right cervical: No superficial, deep or posterior cervical adenopathy.    Left cervical: No superficial, deep or posterior cervical adenopathy.  Skin:    General: Skin is warm and dry.     Capillary Refill: Capillary refill takes less than 2 seconds.     Coloration: Skin is not jaundiced or pale.     Findings: No bruising, erythema, lesion or rash.  Neurological:     General: No focal deficit present.     Mental Status: She is alert and oriented to person, place, and time. Mental status is at baseline.     GCS: GCS eye subscore is 4. GCS verbal subscore is 5. GCS motor subscore is 6.     Sensory: Sensation is intact. No sensory deficit.     Motor: Motor function is intact. No weakness.     Coordination: Coordination is intact. Coordination normal.     Gait: Gait is intact. Gait normal.  Psychiatric:        Attention and Perception: Attention and perception normal.        Mood and Affect: Mood and affect normal.        Speech: Speech normal.        Behavior: Behavior normal. Behavior is cooperative.        Thought Content: Thought content normal.        Cognition and Memory: Cognition and  memory normal.        Judgment: Judgment normal.      Last depression screening scores    06/14/2022    4:16 PM 05/10/2022    1:14 PM 06/15/2021    2:33 PM  PHQ 2/9 Scores  PHQ - 2 Score 1 0 3  PHQ- 9 Score 2 0 11   Last fall risk screening    05/10/2022    1:14 PM  Fall Risk   Falls in the past year? 0  Number falls in past yr: 0  Injury with Fall? 0  Risk for fall due to : No Fall Risks  Follow up Falls evaluation completed   Last Audit-C alcohol use screening    06/14/2022    4:16 PM  Alcohol Use Disorder Test (AUDIT)  1. How often do you have a drink containing alcohol? 0  2. How many drinks containing alcohol do you have on a typical day when you are drinking? 0  3. How often do you have six or more drinks on one occasion? 0  AUDIT-C Score 0   A score of 3 or more in women, and 4 or more in men indicates increased risk for alcohol abuse, EXCEPT if all of the points are from question 1   No results found for any visits on 08/14/22.  Assessment & Plan    Routine Health Maintenance and Physical Exam  Exercise Activities and Dietary recommendations  Goals       Reduce signs and symptoms of depression and anxiety (pt-stated)      Timeframe:  Long-Range Goal Priority:  High Start Date:  06/15/21                           Expected End Date:            09/27/21           Follow Up Date 06/28/21/     -  check out counseling - keep 90 percent of counseling appointments - schedule counseling appointment -consider taking the medication prescribed to help with anxiety -call [phone] for mental health hotline/crisis line if needed (24/7 available) -try techniques to reduce symptoms of anxiety (deep breathing, distraction, positive self talk, etc)    Why is this important?   Beating depression may take some time.  If you don't feel better right away, don't give up on your treatment plan.    Notes:         Immunization History  Administered Date(s) Administered    DTaP 11/07/1995, 03/10/1996, 04/15/1996, 03/31/1997, 06/29/2000   HPV Quadrivalent 09/12/2006, 12/14/2006, 03/05/2007   Hepatitis A, Ped/Adol-2 Dose 09/12/2006, 03/05/2007, 03/14/2013   Hepatitis B, PED/ADOLESCENT 04/04/1996, 11/07/1995, 04/15/1996   IPV 11/07/1995, 03/10/1996, 04/15/1996, 06/29/2000   Influenza Nasal 02/05/2009   Influenza,inj,Quad PF,6+ Mos 01/18/2018, 01/11/2021   MMR 09/03/1996, 06/29/2000, 08/10/2018   Meningococcal Conjugate 09/12/2006   Meningococcal polysaccharide vaccine (MPSV4) 11/30/2011   PFIZER(Purple Top)SARS-COV-2 Vaccination 11/11/2019   Td 09/12/2006   Tdap 09/12/2006, 04/04/2017, 05/10/2018, 11/04/2020    Health Maintenance  Topic Date Due   COVID-19 Vaccine (2 - Pfizer risk series) 12/02/2019   PAP-Cervical Cytology Screening  11/11/2022   PAP SMEAR-Modifier  11/11/2022   INFLUENZA VACCINE  11/30/2022   DTaP/Tdap/Td (11 - Td or Tdap) 11/05/2030   HPV VACCINES  Completed   Hepatitis C Screening  Completed   HIV Screening  Completed    Discussed health benefits of physical activity, and encouraged her to engage in regular exercise appropriate for her age and condition.  Problem List Items Addressed This Visit       Other   Annual physical exam - Primary    Encouraged to schedule dental cleaning Things to do to keep yourself healthy  - Exercise at least 30-45 minutes a day, 3-4 days a week.  - Eat a low-fat diet with lots of fruits and vegetables, up to 7-9 servings per day.  - Seatbelts can save your life. Wear them always.  - Smoke detectors on every level of your home, check batteries every year.  - Eye Doctor - have an eye exam every 1-2 years  - Safe sex - if you may be exposed to STDs, use a condom.  - Alcohol -  If you drink, do it moderately, less than 2 drinks per day.  - Health Care Power of Attorney. Choose someone to speak for you if you are not able.  - Depression is common in our stressful world.If you're feeling down or  losing interest in things you normally enjoy, please come in for a visit.  - Violence - If anyone is threatening or hurting you, please call immediately.       Relevant Orders   Comprehensive Metabolic Panel (CMET)   CBC with Differential/Platelet   History of gestational diabetes    Repeat A1c for early identification of pre-diabetes/T2 DM Continue to recommend balanced, lower carb meals. Smaller meal size, adding snacks. Choosing water as drink of choice and increasing purposeful exercise.       Relevant Orders   Hemoglobin A1c   Mixed hyperlipidemia    Chronic, stable Repeat LP LDL goal <100 I continue to recommend diet low in saturated fat and regular exercise - 30 min at least 5 times per week       Relevant Orders   Lipid panel   Return in about 1 year (around 08/14/2023) for annual examination.    Kathyrn Lass  Grier Mitts, FNP, have reviewed all documentation for this visit. The documentation on 08/14/22 for the exam, diagnosis, procedures, and orders are all accurate and complete.  Martha Kindle, FNP  [facility] [phone] (phone) [phone] (fax)  [facility]

## 2022-08-14 ENCOUNTER — Encounter: Payer: Self-pay | Admitting: Family Medicine

## 2022-08-14 ENCOUNTER — Encounter: Payer: Self-pay | Admitting: Podiatry

## 2022-08-14 ENCOUNTER — Ambulatory Visit: Payer: 59 | Admitting: Podiatry

## 2022-08-14 ENCOUNTER — Ambulatory Visit (INDEPENDENT_AMBULATORY_CARE_PROVIDER_SITE_OTHER): Payer: 59 | Admitting: Family Medicine

## 2022-08-14 VITALS — BP 115/80 | HR 81 | Temp 98.4°F | Ht <= 58 in | Wt 201.0 lb

## 2022-08-14 DIAGNOSIS — Z8632 Personal history of gestational diabetes: Secondary | ICD-10-CM | POA: Diagnosis not present

## 2022-08-14 DIAGNOSIS — L600 Ingrowing nail: Secondary | ICD-10-CM

## 2022-08-14 DIAGNOSIS — E782 Mixed hyperlipidemia: Secondary | ICD-10-CM | POA: Diagnosis not present

## 2022-08-14 DIAGNOSIS — L03032 Cellulitis of left toe: Secondary | ICD-10-CM | POA: Diagnosis not present

## 2022-08-14 DIAGNOSIS — Z0000 Encounter for general adult medical examination without abnormal findings: Secondary | ICD-10-CM | POA: Diagnosis not present

## 2022-08-14 MED ORDER — DOXYCYCLINE HYCLATE 100 MG PO TABS: 100.0000 mg | ORAL_TABLET | Freq: Two times a day (BID) | ORAL | 0 refills | Status: DC

## 2022-08-14 MED ORDER — MUPIROCIN 2 % EX OINT: 1.0000 | TOPICAL_OINTMENT | Freq: Two times a day (BID) | CUTANEOUS | 1 refills | Status: DC

## 2022-08-14 NOTE — Assessment & Plan Note (Signed)
Encouraged to schedule dental cleaning Things to do to keep yourself healthy  - Exercise at least 30-45 minutes a day, 3-4 days a week.  - Eat a low-fat diet with lots of fruits and vegetables, up to 7-9 servings per day.  - Seatbelts can save your life. Wear them always.  - Smoke detectors on every level of your home, check batteries every year.  - Eye Doctor - have an eye exam every 1-2 years  - Safe sex - if you may be exposed to STDs, use a condom.  - Alcohol -  If you drink, do it moderately, less than 2 drinks per day.  - Health Care Power of Attorney. Choose someone to speak for you if you are not able.  - Depression is common in our stressful world.If you're feeling down or losing interest in things you normally enjoy, please come in for a visit.  - Violence - If anyone is threatening or hurting you, please call immediately.

## 2022-08-14 NOTE — Progress Notes (Signed)
   Chief Complaint  Patient presents with   Toe Pain    Hallux left - medial border, ingrown, red, swollen x 1 week, soaking and neosporin and bandaid    Subjective: Patient presents today for evaluation of pain to the medial border of the left great toe. Patient is concerned for possible ingrown nail.  It is very sensitive to touch.  She has been soaking it and applying triple antibiotic ointment.  She does have history of ingrown toenails to the right great toe and has had them fixed here in our office.  Patient presents today for further treatment and evaluation.  Past Medical History:  Diagnosis Date   Complication of anesthesia    hard to wake up high level with CS   Dysmenorrhea    Gastroesophageal reflux    takes prescribed meds daily   Gestational diabetes    Headache(784.0)    not as often   Heart murmur    Pneumonia 2013   Rubella non-immune status, antepartum    Tinea capitis    Varicella     Objective:  General: Well developed, nourished, in no acute distress, alert and oriented x3   Dermatology: Skin is warm, dry and supple bilateral.  Medial border left great toe is tender with evidence of an ingrowing nail. Pain on palpation noted to the border of the nail fold.  There is some localized erythema around the area consistent with a low-grade infection/cellulitis of the paronychia.  The remaining nails appear unremarkable at this time. There are no open sores, lesions.  Vascular: DP and PT pulses palpable.  No clinical evidence of vascular compromise  Neruologic: Grossly intact via light touch bilateral.  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  Dr. Allena Katz and McDonald  Plan of Care:  1. Patient evaluated.  2. Discussed treatment alternatives and plan of care.  Explained nail avulsion procedure and post procedure course to patient. 3. Patient opted for permanent partial nail avulsion of the ingrown portion of the nail.  4. Prior to procedure, local anesthesia infiltration utilized using 3 ml of a 50:50 mixture of 2% plain lidocaine and 0.5% plain marcaine in a normal hallux block fashion and a betadine prep performed.  5. Partial permanent nail avulsion with chemical matrixectomy performed using 3x30sec applications of phenol followed by alcohol flush.  6. Light dressing applied.  Post care instructions provided 7.  Prescription for mupirocin 2% ointment.  Apply 2 times daily 8.  Prescription for doxycycline 100 mg 2 times daily #20 9.  Return to clinic 3 weeks  Felecia Shelling, DPM [facility]  Dr. Felecia Shelling, DPM    [address] [place], [place] [address]                Office [phone]  Fax [phone]

## 2022-08-14 NOTE — Patient Instructions (Signed)

## 2022-08-14 NOTE — Assessment & Plan Note (Signed)
Chronic, stable Repeat LP LDL goal <100 I continue to recommend diet low in saturated fat and regular exercise - 30 min at least 5 times per week

## 2022-08-14 NOTE — Assessment & Plan Note (Signed)
Repeat A1c for early identification of pre-diabetes/T2 DM Continue to recommend balanced, lower carb meals. Smaller meal size, adding snacks. Choosing water as drink of choice and increasing purposeful exercise.

## 2022-08-15 ENCOUNTER — Telehealth: Payer: Self-pay

## 2022-08-15 ENCOUNTER — Encounter: Payer: Self-pay | Admitting: Family Medicine

## 2022-08-15 LAB — COMPREHENSIVE METABOLIC PANEL
ALT: 19 IU/L (ref 0–32)
AST: 16 IU/L (ref 0–40)
Albumin/Globulin Ratio: 1.8 (ref 1.2–2.2)
Albumin: 4.2 g/dL (ref 4.0–5.0)
Alkaline Phosphatase: 109 IU/L (ref 44–121)
BUN/Creatinine Ratio: 24 — ABNORMAL HIGH (ref 9–23)
BUN: 19 mg/dL (ref 6–20)
Bilirubin Total: 0.3 mg/dL (ref 0.0–1.2)
CO2: 22 mmol/L (ref 20–29)
Calcium: 9.2 mg/dL (ref 8.7–10.2)
Chloride: 104 mmol/L (ref 96–106)
Creatinine, Ser: 0.78 mg/dL (ref 0.57–1.00)
Globulin, Total: 2.3 g/dL (ref 1.5–4.5)
Glucose: 88 mg/dL (ref 70–99)
Potassium: 4 mmol/L (ref 3.5–5.2)
Sodium: 141 mmol/L (ref 134–144)
Total Protein: 6.5 g/dL (ref 6.0–8.5)
eGFR: 107 mL/min/{1.73_m2} (ref >59)

## 2022-08-15 LAB — LIPID PANEL
Chol/HDL Ratio: 3.4 ratio (ref 0.0–4.4)
Cholesterol, Total: 181 mg/dL (ref 100–199)
HDL: 54 mg/dL (ref >39)
LDL Chol Calc (NIH): 111 mg/dL — ABNORMAL HIGH (ref 0–99)
Triglycerides: 85 mg/dL (ref 0–149)
VLDL Cholesterol Cal: 16 mg/dL (ref 5–40)

## 2022-08-15 LAB — CBC WITH DIFFERENTIAL/PLATELET
Basophils Absolute: 0 10*3/uL (ref 0.0–0.2)
Basos: 1 %
EOS (ABSOLUTE): 0.1 10*3/uL (ref 0.0–0.4)
Eos: 1 %
Hematocrit: 39.5 % (ref 34.0–46.6)
Hemoglobin: 12.8 g/dL (ref 11.1–15.9)
Immature Grans (Abs): 0 10*3/uL (ref 0.0–0.1)
Immature Granulocytes: 0 %
Lymphocytes Absolute: 2.7 10*3/uL (ref 0.7–3.1)
Lymphs: 37 %
MCH: 25.7 pg — ABNORMAL LOW (ref 26.6–33.0)
MCHC: 32.4 g/dL (ref 31.5–35.7)
MCV: 79 fL (ref 79–97)
Monocytes Absolute: 0.6 10*3/uL (ref 0.1–0.9)
Monocytes: 9 %
Neutrophils Absolute: 3.7 10*3/uL (ref 1.4–7.0)
Neutrophils: 52 %
Platelets: 308 10*3/uL (ref 150–450)
RBC: 4.98 x10E6/uL (ref 3.77–5.28)
RDW: 13.1 % (ref 11.7–15.4)
WBC: 7.1 10*3/uL (ref 3.4–10.8)

## 2022-08-15 LAB — HEMOGLOBIN A1C
Est. average glucose Bld gHb Est-mCnc: 111 mg/dL
Hgb A1c MFr Bld: 5.5 % (ref 4.8–5.6)

## 2022-08-15 NOTE — Progress Notes (Signed)
Labs have been reviewed and are stable. Borderline elevation in bad/LDL cholesterol remains. I continue to recommend diet low in saturated fat and regular exercise - 30 min at least 5 times per week

## 2022-08-29 ENCOUNTER — Ambulatory Visit: Payer: 59 | Admitting: Podiatry

## 2022-08-29 NOTE — Telephone Encounter (Signed)
Patient aware.

## 2022-09-04 ENCOUNTER — Ambulatory Visit: Payer: 59 | Admitting: Podiatry

## 2022-10-17 ENCOUNTER — Ambulatory Visit
Admission: EM | Admit: 2022-10-17 | Discharge: 2022-10-17 | Disposition: A | Discharge status: Home / Self Care | Payer: 59 | Attending: Nurse Practitioner | Admitting: Nurse Practitioner

## 2022-10-17 DIAGNOSIS — M533 Sacrococcygeal disorders, not elsewhere classified: Secondary | ICD-10-CM

## 2022-10-17 MED ORDER — CYCLOBENZAPRINE HCL 10 MG PO TABS: 10.0000 mg | ORAL_TABLET | Freq: Every evening | ORAL | 0 refills | Status: DC | PRN

## 2022-10-17 MED ORDER — KETOROLAC TROMETHAMINE 60 MG/2ML IM SOLN
60.0000 mg | Freq: Once | INTRAMUSCULAR | Status: AC
  Administered 2022-10-17: 60 mg via INTRAMUSCULAR

## 2022-10-17 NOTE — ED Provider Notes (Signed)
[facility]    CSN: [id number] Arrival date & time: 10/17/22  1457      History   Chief Complaint Chief Complaint  Patient presents with   Tailbone Pain    HPI Martha Herrera is a 27 y.o. female.   Patient presents today for tailbone pain that began a few days ago after sliding on a slip and slide.  Reports when she was going down the slide, there was a hard rock to the side and she did not realize it.  She reports her tailbone went right over the right and she has had pain ever since.  She denies numbness or tingling in her legs, saddle anesthesia, new bowel or bladder incontinence, weakness or decreased sensation of lower extremities.  She reports it hurts to sit or bend over.  No bruising over the tailbone.  No burning urination or increased urinary frequency.  She has been taking ibuprofen, using IcyHot, ice, and Epsom salt soaks without relief.    Past Medical History:  Diagnosis Date   Complication of anesthesia    hard to wake up high level with CS   Dysmenorrhea    Gastroesophageal reflux    takes prescribed meds daily   Gestational diabetes    Headache(784.0)    not as often   Heart murmur    Pneumonia 2013   Rubella non-immune status, antepartum    Tinea capitis    Varicella     Patient Active Problem List   Diagnosis Date Noted   Mixed hyperlipidemia 08/14/2022   History of 2 cesarean sections 08/24/2021   Annual physical exam 06/08/2021   History of bilateral tubal ligation 01/20/2021   History of gestational diabetes 05/13/2018    Past Surgical History:  Procedure Laterality Date   ADENOIDECTOMY     CESAREAN SECTION N/A 08/07/2018   Procedure: CESAREAN SECTION;  Surgeon: Lazaro Arms, MD;  Location: [facility];  Service: Obstetrics;  Laterality: N/A;   CESAREAN SECTION N/A 01/20/2021   Procedure: CESAREAN SECTION;  Surgeon: Kathrynn Running, MD;  Location: [facility];  Service: Obstetrics;  Laterality: N/A;   CESAREAN SECTION WITH  BILATERAL TUBAL LIGATION Bilateral 01/20/2021   ESOPHAGOGASTRODUODENOSCOPY  05/03/2012   Procedure: ESOPHAGOGASTRODUODENOSCOPY (EGD);  Surgeon: Jon Gills, MD;  Location: [facility] OR;  Service: Gastroenterology;  Laterality: N/A;   ESOPHAGOGASTRODUODENOSCOPY N/A 12/15/2021   Procedure: ESOPHAGOGASTRODUODENOSCOPY (EGD);  Surgeon: Sung Amabile, DO;  Location: [facility];  Service: General;  Laterality: N/A;   tubes in ears     WISDOM TOOTH EXTRACTION      OB History     Gravida  2   Para  2   Term  2   Preterm  0   AB  0   Living  2      SAB  0   IAB  0   Ectopic  0   Multiple  0   Live Births  2        Obstetric Comments  Patient had a tubal ligation 01/20/21.          Home Medications    Prior to Admission medications   Medication Sig Start Date End Date Taking? Authorizing Provider  cyclobenzaprine (FLEXERIL) 10 MG tablet Take 1 tablet (10 mg total) by mouth at bedtime as needed for muscle spasms. Do not take with alcohol or while driving or operating heavy machinery.  May cause drowsiness. 10/17/22  Yes Valentino Nose, NP  albuterol (VENTOLIN  HFA) 108 (90 Base) MCG/ACT inhaler Inhale 2 puffs into the lungs every 6 (six) hours as needed for wheezing or shortness of breath. 05/10/22   [name], FNP  butalbital-acetaminophen-caffeine (FIORICET) [phone] MG tablet Take 1-2 tablets by mouth daily as needed for headache. 06/14/22 06/14/23  [name], FNP  fluticasone [facility]) 50 MCG/ACT nasal spray Place 2 sprays into both nostrils daily. 05/10/22   [name], FNP  loratadine (CLARITIN) 10 MG tablet Take 10 mg by mouth daily.    [provider]  mupirocin ointment (BACTROBAN) 2 % Apply 1 Application topically 2 (two) times daily. 08/14/22   Felecia Shelling, DPM  omeprazole (PRILOSEC) 40 MG capsule Take 1 capsule (40 mg total) by mouth daily. 05/03/22   [name], FNP    Family History Family History  Problem Relation Age of Onset    GER disease Maternal Grandmother    Hyperlipidemia Maternal Grandmother    Cancer Maternal Grandfather    Diabetes Maternal Grandfather    Hyperlipidemia Maternal Grandfather    Vision loss Maternal Grandfather    GER disease Father    Cancer Father        kidney   Miscarriages / Stillbirths Mother    Headache Mother    Asthma Brother    Miscarriages / [place] Brother    ADD / ADHD Brother    Asthma Son    GER disease Maternal Aunt    Migraines Maternal Aunt    Anxiety disorder Maternal Aunt    Diabetes Paternal Aunt    Cancer Paternal Uncle        spinal cancer   Ataxia Neg Hx    Chorea Neg Hx    Dementia Neg Hx    Mental retardation Neg Hx    Multiple sclerosis Neg Hx    Neurofibromatosis Neg Hx    Neuropathy Neg Hx    Parkinsonism Neg Hx    Seizures Neg Hx    Stroke Neg Hx    Depression Neg Hx    Bipolar disorder Neg Hx     Social History Social History   Tobacco Use   Smoking status: Never   Smokeless tobacco: Never  Vaping Use   Vaping Use: Never used  Substance Use Topics   Alcohol use: No   Drug use: No     Allergies   Penicillins and Amoxil [amoxicillin]   Review of Systems Review of Systems Per HPI  Physical Exam Triage Vital Signs ED Triage Vitals [10/17/22 1520]  Enc Vitals Group     BP 139/89     Pulse Rate 93     Resp 16     Temp 99 F (37.2 C)     Temp Source Oral     SpO2 98 %     Weight      Height      Head Circumference      Peak Flow      Pain Score 10     Pain Loc      Pain Edu?      Excl. in GC?    No data found.  Updated Vital Signs BP 139/89 (BP Location: Left Arm)   Pulse 93   Temp 99 F (37.2 C) (Oral)   Resp 16   LMP 10/02/2022 (Approximate)   SpO2 98%   Visual Acuity Right Eye Distance:   Left Eye Distance:   Bilateral Distance:    Right Eye Near:   Left Eye  Near:    Bilateral Near:     Physical Exam Vitals and nursing note reviewed.  Constitutional:      General: She is not in acute  distress.    Appearance: Normal appearance. She is not toxic-appearing.  HENT:     Mouth/Throat:     Mouth: Mucous membranes are moist.     Pharynx: Oropharynx is clear.  Pulmonary:     Effort: Pulmonary effort is normal. No respiratory distress.  Musculoskeletal:       Legs:     Comments: Inspection: No swelling, evidence of hernia, redness, or bruising to sacral region Palpation: Sacral spine tender to palpation in approximately area marked; no obvious deformities palpated ROM: Full ROM to spine, bilateral lower extremities Strength: 5/5 bilateral lower extremities Neurovascular: neurovascularly intact in left and right lower extremity   Skin:    General: Skin is warm and dry.     Capillary Refill: Capillary refill takes less than 2 seconds.     Coloration: Skin is not jaundiced or pale.     Findings: No bruising or erythema.  Neurological:     Mental Status: She is alert and oriented to person, place, and time.  Psychiatric:        Behavior: Behavior is cooperative.      UC Treatments / Results  Labs (all labs ordered are listed, but only abnormal results are displayed) Labs Reviewed - No data to display  EKG   Radiology No results found.  Procedures Procedures (including critical care time)  Medications Ordered in UC Medications  ketorolac (TORADOL) injection 60 mg (60 mg Intramuscular Given 10/17/22 1557)    Initial Impression / Assessment and Plan / UC Course  I have reviewed the triage vital signs and the nursing notes.  Pertinent labs & imaging results that were available during my care of the patient were reviewed by me and considered in my medical decision making (see chart for details).   Patient is well-appearing, normotensive, afebrile, not tachycardic, not tachypneic, oxygenating well on room air.    1. Sacralgia Low suspicion for fracture given exam Xray imaging deferred today Supportive care discussed including warmth, donut cushion Pain  treated with Toradol 60 mg IM today in urgent care Discussed no NSAIDs for 48 hours, can take Tylenol, start cyclobenzaprine as needed Seek care for persistent/worsening symptoms despite treatment with PCP  The patient was given the opportunity to ask questions.  All questions answered to their satisfaction.  The patient is in agreement to this plan.    Final Clinical Impressions(s) / UC Diagnoses   Final diagnoses:  Sacralgia     Discharge Instructions      We have given you a shot of Toradol today to help with pain.  You can continue the migraine medication.  Do not take any NSAIDs for 48 hours after the Toradol shot.  You can take Tylenol [phone] mg every 6 hours as needed for pain.  At night time, you can take the cyclobenzaprine as needed to help relax the muscles in your sacrum.  Seek care if symptoms are not improving after 1-2 weeks with this medicine.     ED Prescriptions     Medication Sig Dispense Auth. Provider   cyclobenzaprine (FLEXERIL) 10 MG tablet Take 1 tablet (10 mg total) by mouth at bedtime as needed for muscle spasms. Do not take with alcohol or while driving or operating heavy machinery.  May cause drowsiness. 20 tablet Valentino Nose, NP  PDMP not reviewed this encounter.   Valentino Nose, NP 10/17/22 [phone]

## 2022-10-17 NOTE — Discharge Instructions (Addendum)
We have given you a shot of Toradol today to help with pain.  You can continue the migraine medication.  Do not take any NSAIDs for 48 hours after the Toradol shot.  You can take Tylenol [phone] mg every 6 hours as needed for pain.  At night time, you can take the cyclobenzaprine as needed to help relax the muscles in your sacrum.  Seek care if symptoms are not improving after 1-2 weeks with this medicine.

## 2022-10-17 NOTE — ED Triage Notes (Signed)
Pt states she was on a slip and slide and slipped off onto a rock.  C/o pain to her tailbone. Has tried ice,icy hot, epsom salts soaks with no relief.

## 2022-12-30 ENCOUNTER — Other Ambulatory Visit: Payer: Self-pay | Admitting: Family Medicine

## 2022-12-30 DIAGNOSIS — G43119 Migraine with aura, intractable, without status migrainosus: Secondary | ICD-10-CM

## 2023-01-02 NOTE — Telephone Encounter (Signed)
Requested medication (s) are due for refill today: yes  Requested medication (s) are on the active medication list: yes  Last refill:  06/14/22  Future visit scheduled: yes  Notes to clinic:  Unable to refill per protocol, cannot delegate.      Requested Prescriptions  Pending Prescriptions Disp Refills   butalbital-acetaminophen-caffeine (FIORICET) 50-325-40 MG tablet [Pharmacy Med Name: Butalbital-APAP-Caffeine 50-325-40 MG Oral Tablet] 90 tablet 0    Sig: TAKE 1 TO 2 TABLETS BY MOUTH ONCE DAILY AS NEEDED FOR HEADACHE     Not Delegated - Analgesics:  Non-Opioid Analgesic Combinations 2 Failed - 12/30/2022  2:58 PM      Failed - This refill cannot be delegated      Passed - Cr in normal range and within 360 days    Creatinine, Ser  Date Value Ref Range Status  08/14/2022 0.78 0.57 - 1.00 mg/dL Final   Creatinine, Urine  Date Value Ref Range Status  01/20/2021 79.43 mg/dL Final         Passed - eGFR is 10 or above and within 360 days    GFR calc Af Amer  Date Value Ref Range Status  01/07/2020 109 >59 mL/min/1.73 Final    Comment:    **[facility] currently reports eGFR in compliance with the current**   recommendations of the [employer]. [facility] will   update reporting as new guidelines are published from the NKF-ASN   Task force.    GFR, Estimated  Date Value Ref Range Status  05/14/2022 >60 >60 mL/min Final    Comment:    (NOTE) Calculated using the CKD-EPI Creatinine Equation (2021)    eGFR  Date Value Ref Range Status  08/14/2022 107 >59 mL/min/1.73 Final         Passed - Patient is not pregnant      Passed - Valid encounter within last 12 months    Recent Outpatient Visits           4 months ago Annual physical exam   [facility] Merita Norton T, FNP   6 months ago Acute recurrent pansinusitis   [facility] Merita Norton T, FNP   7 months ago Non-recurrent acute suppurative otitis  media of left ear without spontaneous rupture of tympanic membrane   [facility] Merita Norton T, FNP   1 year ago Acute non-recurrent maxillary sinusitis   [facility] [name], FNP   1 year ago Annual physical exam   [facility] [name], FNP

## 2023-01-03 ENCOUNTER — Emergency Department
Admission: EM | Admit: 2023-01-03 | Discharge: 2023-01-03 | Disposition: A | Discharge status: Home / Self Care | Payer: Self-pay | Attending: Emergency Medicine | Admitting: Emergency Medicine

## 2023-01-03 ENCOUNTER — Encounter: Payer: Self-pay | Admitting: Emergency Medicine

## 2023-01-03 ENCOUNTER — Emergency Department: Payer: Self-pay

## 2023-01-03 ENCOUNTER — Other Ambulatory Visit: Payer: Self-pay

## 2023-01-03 ENCOUNTER — Encounter: Payer: Self-pay | Admitting: Family Medicine

## 2023-01-03 DIAGNOSIS — R079 Chest pain, unspecified: Secondary | ICD-10-CM

## 2023-01-03 DIAGNOSIS — K2100 Gastro-esophageal reflux disease with esophagitis, without bleeding: Secondary | ICD-10-CM | POA: Insufficient documentation

## 2023-01-03 LAB — CBC
HCT: 40.5 % (ref 36.0–46.0)
Hemoglobin: 13.2 g/dL (ref 12.0–15.0)
MCH: 25.4 pg — ABNORMAL LOW (ref 26.0–34.0)
MCHC: 32.6 g/dL (ref 30.0–36.0)
MCV: 78 fL — ABNORMAL LOW (ref 80.0–100.0)
Platelets: 304 10*3/uL (ref 150–400)
RBC: 5.19 MIL/uL — ABNORMAL HIGH (ref 3.87–5.11)
RDW: 13.3 % (ref 11.5–15.5)
WBC: 7.1 10*3/uL (ref 4.0–10.5)
nRBC: 0 % (ref 0.0–0.2)

## 2023-01-03 LAB — LIPASE, BLOOD: Lipase: 28 U/L (ref 11–51)

## 2023-01-03 LAB — BASIC METABOLIC PANEL
Anion gap: 9 (ref 5–15)
BUN: 11 mg/dL (ref 6–20)
CO2: 25 mmol/L (ref 22–32)
Calcium: 9.1 mg/dL (ref 8.9–10.3)
Chloride: 103 mmol/L (ref 98–111)
Creatinine, Ser: 0.72 mg/dL (ref 0.44–1.00)
GFR, Estimated: >60 mL/min (ref >60)
Glucose, Bld: 95 mg/dL (ref 70–99)
Potassium: 3.5 mmol/L (ref 3.5–5.1)
Sodium: 137 mmol/L (ref 135–145)

## 2023-01-03 LAB — TROPONIN I (HIGH SENSITIVITY): Troponin I (High Sensitivity): 2 ng/L (ref <18)

## 2023-01-03 LAB — HEPATIC FUNCTION PANEL
ALT: 26 U/L (ref 0–44)
AST: 21 U/L (ref 15–41)
Albumin: 4 g/dL (ref 3.5–5.0)
Alkaline Phosphatase: 90 U/L (ref 38–126)
Bilirubin, Direct: <0.1 mg/dL (ref 0.0–0.2)
Total Bilirubin: <0.1 mg/dL — ABNORMAL LOW (ref 0.3–1.2)
Total Protein: 7.7 g/dL (ref 6.5–8.1)

## 2023-01-03 LAB — POC URINE PREG, ED: Preg Test, Ur: NEGATIVE

## 2023-01-03 MED ORDER — ONDANSETRON 4 MG PO TBDP: 4.0000 mg | ORAL_TABLET | Freq: Three times a day (TID) | ORAL | 0 refills | Status: DC | PRN

## 2023-01-03 MED ORDER — ALUM & MAG HYDROXIDE-SIMETH 200-200-20 MG/5ML PO SUSP
30.0000 mL | Freq: Once | ORAL | Status: AC
  Administered 2023-01-03: 30 mL via ORAL
  Filled 2023-01-03: qty 30

## 2023-01-03 MED ORDER — LIDOCAINE VISCOUS HCL 2 % MT SOLN
15.0000 mL | Freq: Once | OROMUCOSAL | Status: AC
  Administered 2023-01-03: 15 mL via ORAL
  Filled 2023-01-03: qty 15

## 2023-01-03 NOTE — ED Triage Notes (Signed)
Patient to ED via POV for mid sternal CP that started today. Also had N/V the past few days and pain under both breast.

## 2023-01-03 NOTE — ED Notes (Signed)
 Pt denied being able to produce a urine sample at this time. Pt provided with a labeled specimen cup and instructions to return cup to triage nurse desk once it has a clean catch urine sample.

## 2023-01-03 NOTE — ED Provider Notes (Signed)
[facility] Provider Note    Event Date/Time   First MD Initiated Contact with Patient 01/03/23 1832     (approximate)   History   Chief Complaint Chest Pain   HPI  Martha Herrera is a 27 y.o. female with past medical history of hyperlipidemia and GERD who presents to the ED complaining of chest pain.  Patient reports that she has had constant sharp pain in both sides of her upper abdomen radiating upwards into her chest for about the past 24 hours.  Patient denies any nausea or vomiting, also denies any fever, cough, or shortness of breath.  She denies any history of similar symptoms, does state that she has been told she has gallstones.     Physical Exam   Triage Vital Signs: ED Triage Vitals  Encounter Vitals Group     BP 01/03/23 1721 (!) 137/99     Systolic BP Percentile --      Diastolic BP Percentile --      Pulse Rate 01/03/23 1721 94     Resp 01/03/23 1721 18     Temp 01/03/23 1721 98.7 F (37.1 C)     Temp Source 01/03/23 1721 Oral     SpO2 01/03/23 1721 97 %     Weight 01/03/23 1722 190 lb (86.2 kg)     Height 01/03/23 1722 4\' 10"  (1.473 m)     Head Circumference --      Peak Flow --      Pain Score 01/03/23 1722 10     Pain Loc --      Pain Education --      Exclude from Growth Chart --     Most recent vital signs: Vitals:   01/03/23 1721  BP: (!) 137/99  Pulse: 94  Resp: 18  Temp: 98.7 F (37.1 C)  SpO2: 97%    Constitutional: Alert and oriented. Eyes: Conjunctivae are normal. Head: Atraumatic. Nose: No congestion/rhinnorhea. Mouth/Throat: Mucous membranes are moist.  Cardiovascular: Normal rate, regular rhythm. Grossly normal heart sounds.  2+ radial pulses bilaterally. Respiratory: Normal respiratory effort.  No retractions. Lungs CTAB. Gastrointestinal: Soft and tender to palpation in the epigastrium with no rebound or guarding. No distention. Musculoskeletal: No lower extremity tenderness nor edema.  Neurologic:   Normal speech and language. No gross focal neurologic deficits are appreciated.    ED Results / Procedures / Treatments   Labs (all labs ordered are listed, but only abnormal results are displayed) Labs Reviewed  CBC - Abnormal; Notable for the following components:      Result Value   RBC 5.19 (*)    MCV 78.0 (*)    MCH 25.4 (*)    All other components within normal limits  HEPATIC FUNCTION PANEL - Abnormal; Notable for the following components:   Total Bilirubin <0.1 (*)    All other components within normal limits  BASIC METABOLIC PANEL  LIPASE, BLOOD  POC URINE PREG, ED  TROPONIN I (HIGH SENSITIVITY)     EKG  ED ECG REPORT I, Chesley Noon, the attending physician, personally viewed and interpreted this ECG.   Date: 01/03/2023  EKG Time: 17:19  Rate: 92  Rhythm: normal sinus rhythm  Axis: Normal  Intervals:none  ST&T Change: None  RADIOLOGY Chest x-ray reviewed and interpreted by me with no infiltrate, edema, or effusion.  PROCEDURES:  Critical Care performed: No  Procedures   MEDICATIONS ORDERED IN ED: Medications  alum & mag hydroxide-simeth (MAALOX/MYLANTA) 200-200-20 MG/5ML  suspension 30 mL (30 mLs Oral Given 01/03/23 1919)    And  lidocaine (XYLOCAINE) 2 % viscous mouth solution 15 mL (15 mLs Oral Given 01/03/23 1919)     IMPRESSION / MDM / ASSESSMENT AND PLAN / ED COURSE  I reviewed the triage vital signs and the nursing notes.                              27 y.o. female with past medical history of hyperlipidemia and GERD who presents to the ED complaining of about 24 hours of constant pain in the bilateral upper quadrants of her abdomen moving up into her chest.  Patient's presentation is most consistent with acute presentation with potential threat to life or bodily function.  Differential diagnosis includes, but is not limited to, gastritis, GERD, pancreatitis, hepatitis, biliary colic, cholecystitis, ACS, PE, pneumonia,  pneumothorax.  Patient well-appearing and in no acute distress, vital signs are unremarkable.  Her abdomen is soft but she does have tenderness in her epigastrium, symptoms seem most consistent with gastritis/GERD.  EKG shows no evidence of arrhythmia or ischemia and troponin within normal limits, doubt ACS or PE.  Chest x-ray is unremarkable and remainder of labs are reassuring with no significant anemia, leukocytosis, lecture abnormality, or AKI.  LFTs and lipase are unremarkable, pregnancy testing negative.  Patient with no resolution in pain following GI cocktail, will further assess with right upper quadrant ultrasound.  Right upper quadrant ultrasound shows gallstones but no evidence of cholecystitis.  Patient with minimal pain on reassessment and is appropriate for discharge home with PCP follow-up, suspect symptoms due to GERD.  She was counseled to continue PPI and use Pepcid or Tums as needed.  She was counseled to return to the ED for new or worsening symptoms, patient agrees with plan.      FINAL CLINICAL IMPRESSION(S) / ED DIAGNOSES   Final diagnoses:  Nonspecific chest pain  Gastroesophageal reflux disease with esophagitis without hemorrhage     Rx / DC Orders   ED Discharge Orders          Ordered    ondansetron (ZOFRAN-ODT) 4 MG disintegrating tablet  Every 8 hours PRN        01/03/23 2118             Note:  This document was prepared using Dragon voice recognition software and may include unintentional dictation errors.   Chesley Noon, MD 01/03/23 2119

## 2023-01-03 NOTE — ED Notes (Signed)
C?O upper abdominal pain x 2-3 days, pain more consistent over past 24 hours.  Also c/o diarrhea.  Hx of Gallstones.  Denies Vomiting. C/O Nausea.  AAOx3.  Skin warm and dry. NAD

## 2023-01-04 ENCOUNTER — Telehealth: Payer: 59 | Admitting: Family Medicine

## 2023-01-16 ENCOUNTER — Encounter: Payer: Self-pay | Admitting: Podiatry

## 2023-03-20 ENCOUNTER — Encounter: Payer: Self-pay | Admitting: Family Medicine

## 2023-03-20 ENCOUNTER — Ambulatory Visit: Payer: Self-pay | Admitting: *Deleted

## 2023-03-20 NOTE — Telephone Encounter (Signed)
  Chief Complaint: rash to right leg requesting appt Symptoms: red rash approx 2 inches. Looks like blisters not draining. Clusters together, flaky skin at times. Itching. Has tried creams and ointment for eczema not effective. Skin sensitive to touch, around rash. Frequency: 1 month Pertinent Negatives: Patient denies fever no drainage. No aches pains. Disposition: [] ED /[] Urgent Care (no appt availability in office) / [x] Appointment(In office/virtual)/ []  [place] [facility]/ [] Home Care/ [] Refused Recommended Disposition /[] [place] [facility]/ []  Follow-up with PCP Additional Notes:   No available appt with PCP. Patient can only be seen on Friday due to work. Scheduled my chart VV with E. Mecum , PA /[facility] for 03/23/23. Patient sent picture of rash via My Chart. Please advise or make recommendations and if patient needs to keep My chart VV .      Reason for Disposition  Localized rash present > 7 days  Answer Assessment - Initial Assessment Questions 1. APPEARANCE of RASH: "Describe the rash."      Red itching mild raised , blistery looking no drainage, in cluters flaky skin  2. LOCATION: "Where is the rash located?"      Right leg towards ankle  3. NUMBER: "How many spots are there?"      2- 3 inches long clusters in middle  4. SIZE: "How big are the spots?" (Inches, centimeters or compare to size of a coin)      na 5. ONSET: "When did the rash start?"      1 month ago  6. ITCHING: "Does the rash itch?" If Yes, ask: "How bad is the itch?"  (Scale 0-10; or none, mild, moderate, severe)     Itching severe at times.  7. PAIN: "Does the rash hurt?" If Yes, ask: "How bad is the pain?"  (Scale 0-10; or none, mild, moderate, severe)    - NONE (0): no pain    - MILD (1-3): doesn't interfere with normal activities     - MODERATE (4-7): interferes with normal activities or awakens from sleep     - SEVERE (8-10): excruciating pain, unable to do any normal activities     Sensitive  to touch, feels like burning  8. OTHER SYMPTOMS: "Do you have any other symptoms?" (e.g., fever)     Rash itching not going away x 1 month 9. PREGNANCY: "Is there any chance you are pregnant?" "When was your last menstrual period?"     na  Protocols used: Rash or Redness - Localized-A-AH

## 2023-03-23 ENCOUNTER — Telehealth: Payer: 59 | Admitting: Physician Assistant

## 2023-03-23 ENCOUNTER — Ambulatory Visit: Payer: 59 | Admitting: Physician Assistant

## 2023-04-14 IMAGING — US US ABDOMEN LIMITED
1 series · 14 of 25 positions shown · non-contrast
Comparison: None Available.

CLINICAL DATA: Epigastric pain for 2 weeks

EXAM:
ULTRASOUND ABDOMEN LIMITED RIGHT UPPER QUADRANT

[Series 1: us abdomen limited ruq (liver/gb) · 14 of 43 slices shown]
[im 1/43]
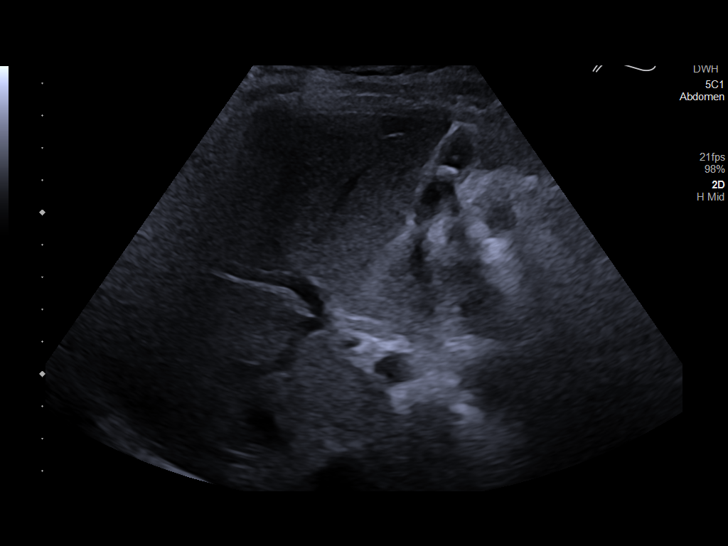
[im 4/43]
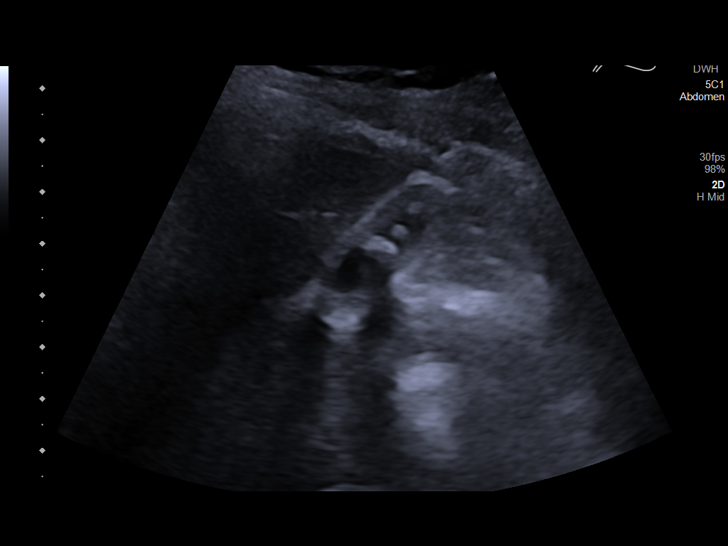
[im 8/43]
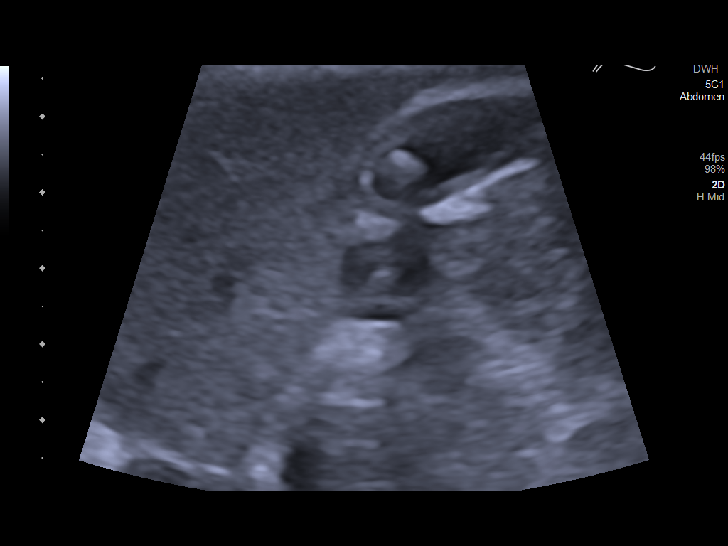
[im 11/43]
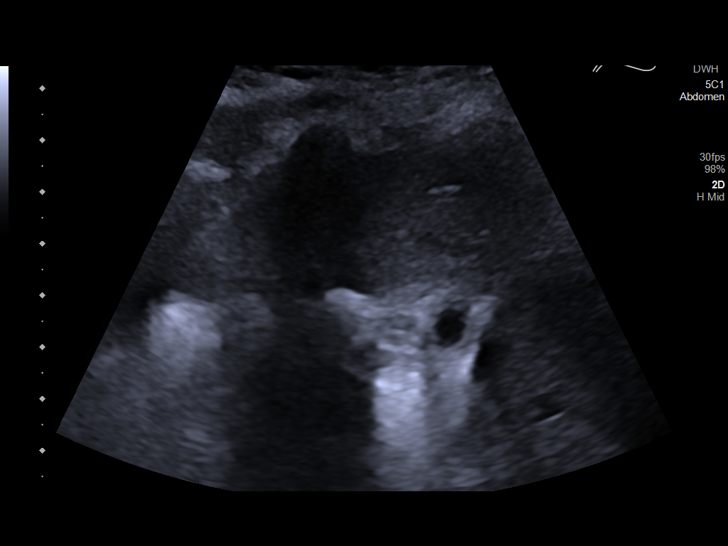
[im 15/43]
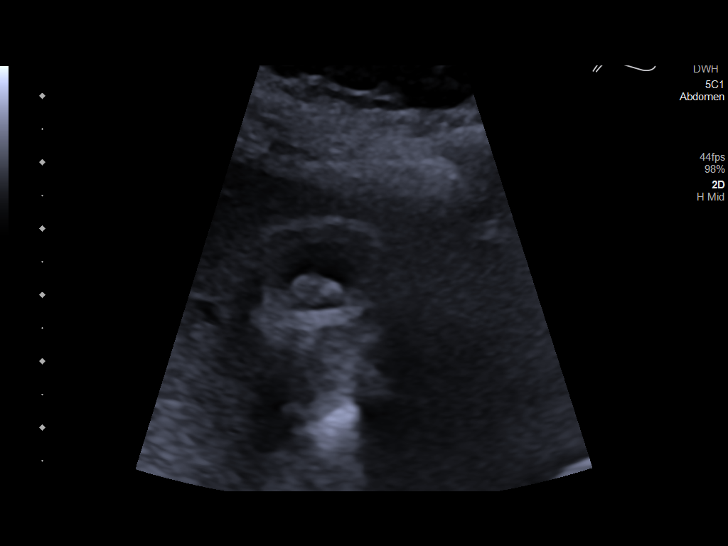
[im 16/43]
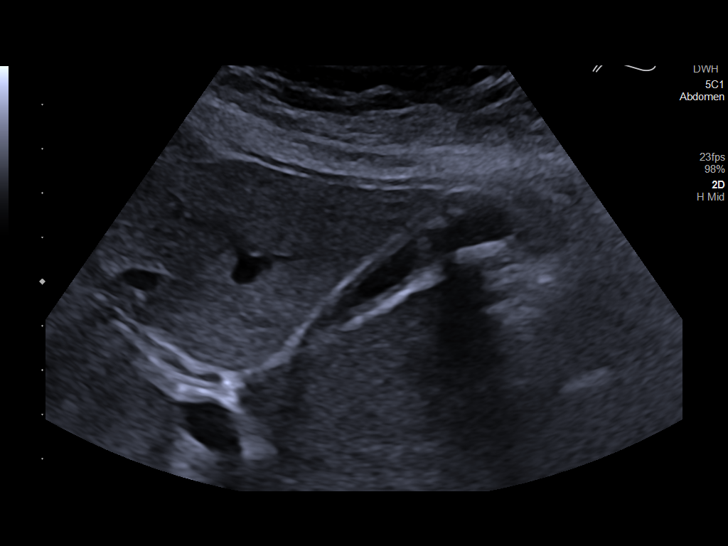
[im 20/43]
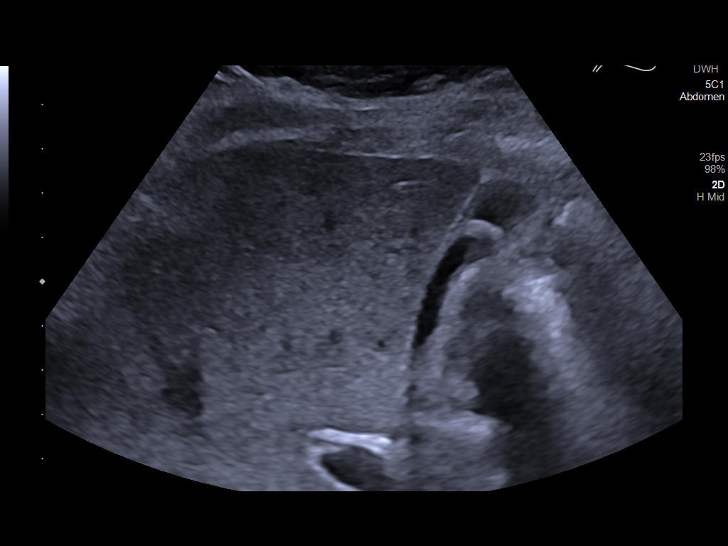
[im 23/43]
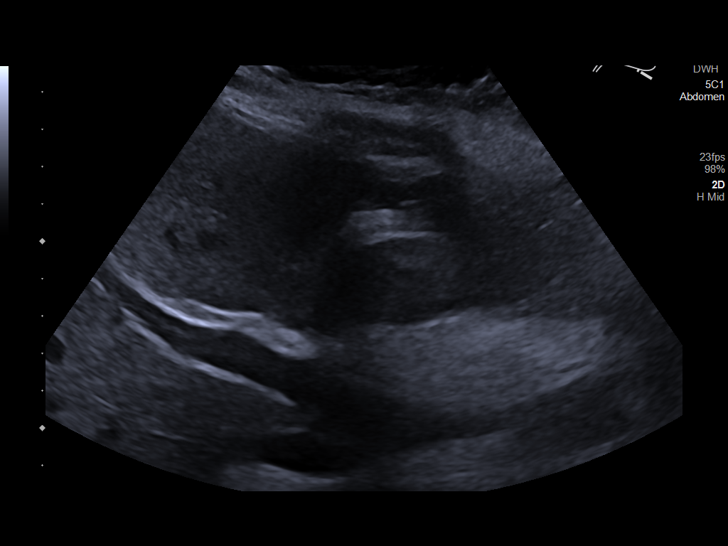
[im 27/43]
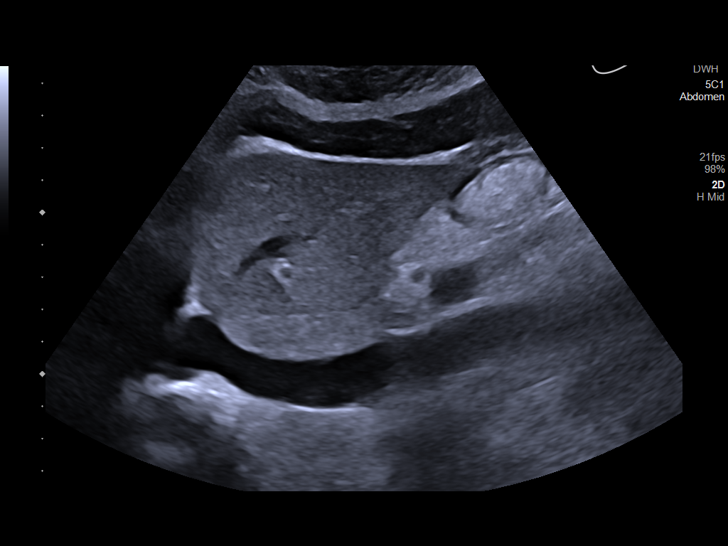
[im 29/43]
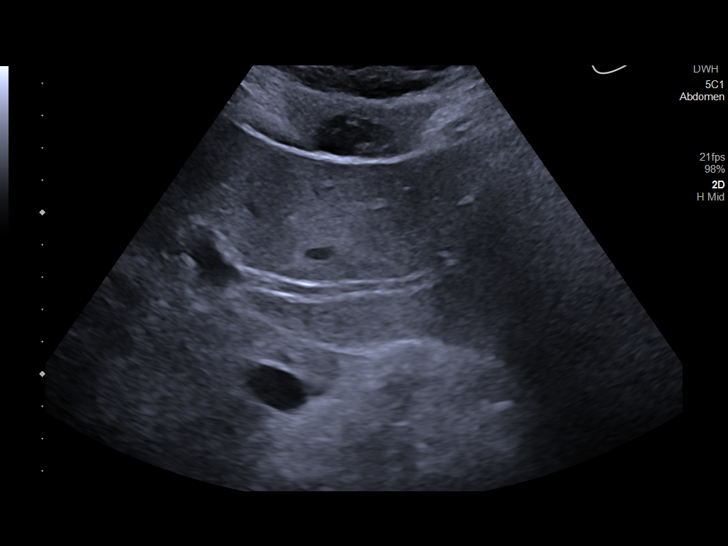
[im 32/43]
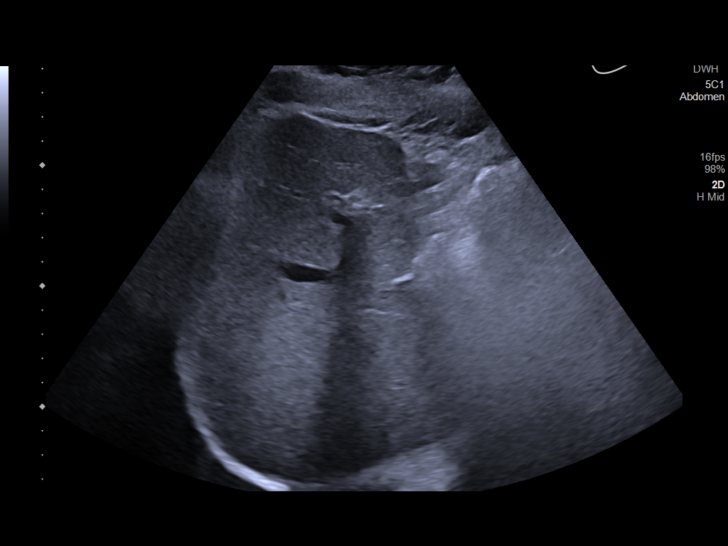
[im 36/43]
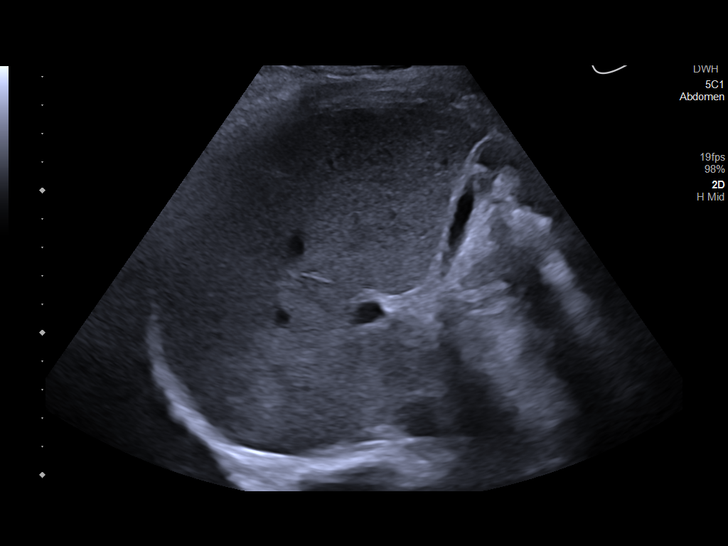
[im 39/43]
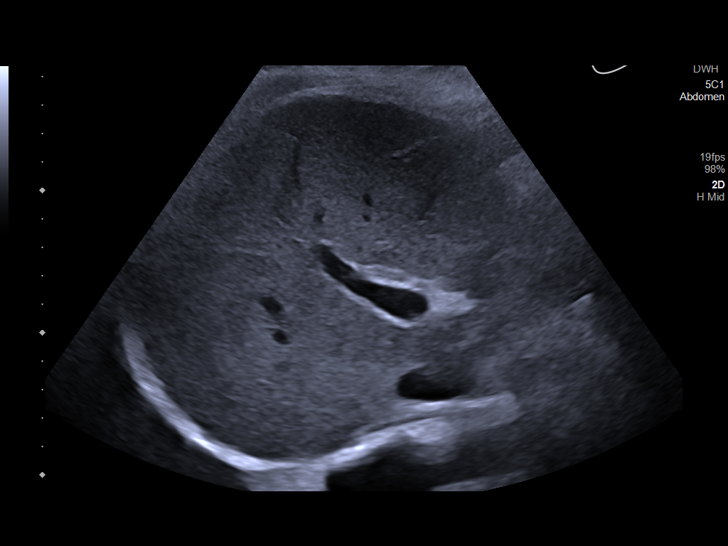
[im 43/43]
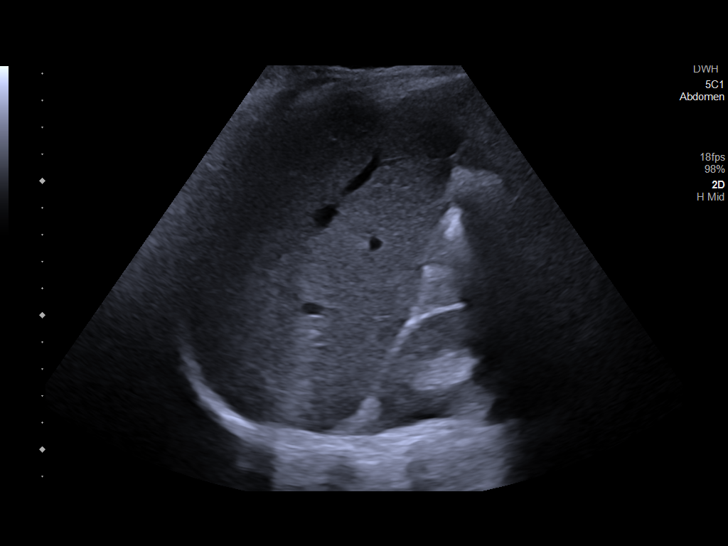

[14 of 25 positions shown; findings below may reference images not displayed]

FINDINGS: Gallbladder:

Contracted gallbladder with multiple gallstones. No gallbladder wall
thickening or edema. Murphy's sign is negative.

Common bile duct:

Diameter: 3 mm, normal

Liver:

No focal lesion identified. Within normal limits in parenchymal
echogenicity. Portal vein is patent on color Doppler imaging with
normal direction of blood flow towards the liver.

Other: None.
IMPRESSION: Cholelithiasis. No additional changes to suggest acute
cholecystitis.

## 2023-05-23 ENCOUNTER — Ambulatory Visit: Payer: Self-pay | Admitting: Family Medicine

## 2023-06-15 ENCOUNTER — Telehealth: Payer: Self-pay | Admitting: Physician Assistant

## 2023-06-15 DIAGNOSIS — R070 Pain in throat: Secondary | ICD-10-CM | POA: Diagnosis not present

## 2023-06-15 DIAGNOSIS — J101 Influenza due to other identified influenza virus with other respiratory manifestations: Secondary | ICD-10-CM | POA: Diagnosis not present

## 2023-06-15 DIAGNOSIS — K2100 Gastro-esophageal reflux disease with esophagitis, without bleeding: Secondary | ICD-10-CM

## 2023-06-15 DIAGNOSIS — Z20822 Contact with and (suspected) exposure to covid-19: Secondary | ICD-10-CM | POA: Diagnosis not present

## 2023-06-15 NOTE — Telephone Encounter (Signed)
[facility] pharmacy is requesting refill omeprazole (PRILOSEC) 40 MG capsule   Please advise

## 2023-06-15 NOTE — Telephone Encounter (Signed)
[facility] is requesting refill butalbital-acetaminophen-caffeine (FIORICET) 50-325-40 MG tablet   Please advise

## 2023-06-18 ENCOUNTER — Encounter: Payer: Self-pay | Admitting: Physician Assistant

## 2023-06-18 MED ORDER — OMEPRAZOLE 40 MG PO CPDR: 40.0000 mg | DELAYED_RELEASE_CAPSULE | Freq: Every day | ORAL | 3 refills | Status: DC

## 2023-06-20 ENCOUNTER — Telehealth: Payer: Self-pay | Admitting: Physician Assistant

## 2023-06-20 NOTE — Telephone Encounter (Signed)
Advised to stop taking due to SE on 06/18/23. Please see mychart encounter Rx not sent

## 2023-06-20 NOTE — Telephone Encounter (Signed)
 [facility] is requesting refill butalbital-acetaminophen-caffeine (FIORICET) 50-325-40 MG tablet   Please advise

## 2023-06-22 ENCOUNTER — Ambulatory Visit: Payer: Self-pay | Admitting: Family Medicine

## 2023-07-04 ENCOUNTER — Encounter: Payer: Self-pay | Admitting: Women's Health

## 2023-07-05 ENCOUNTER — Encounter: Payer: Self-pay | Admitting: Family Medicine

## 2023-07-25 ENCOUNTER — Other Ambulatory Visit (HOSPITAL_COMMUNITY)
Admission: RE | Admit: 2023-07-25 | Discharge: 2023-07-25 | Disposition: A | Discharge status: Home / Self Care | Source: Ambulatory Visit | Attending: Family Medicine | Admitting: Family Medicine

## 2023-07-25 ENCOUNTER — Encounter: Payer: Self-pay | Admitting: Family Medicine

## 2023-07-25 ENCOUNTER — Ambulatory Visit: Payer: 59 | Admitting: Family Medicine

## 2023-07-25 VITALS — BP 121/82 | HR 97 | Ht <= 58 in | Wt 208.0 lb

## 2023-07-25 DIAGNOSIS — G43709 Chronic migraine without aura, not intractable, without status migrainosus: Secondary | ICD-10-CM | POA: Diagnosis not present

## 2023-07-25 DIAGNOSIS — N760 Acute vaginitis: Secondary | ICD-10-CM

## 2023-07-25 DIAGNOSIS — Z8632 Personal history of gestational diabetes: Secondary | ICD-10-CM

## 2023-07-25 DIAGNOSIS — Z01419 Encounter for gynecological examination (general) (routine) without abnormal findings: Secondary | ICD-10-CM | POA: Insufficient documentation

## 2023-07-25 DIAGNOSIS — Z13228 Encounter for screening for other metabolic disorders: Secondary | ICD-10-CM

## 2023-07-25 DIAGNOSIS — R21 Rash and other nonspecific skin eruption: Secondary | ICD-10-CM

## 2023-07-25 DIAGNOSIS — Z01411 Encounter for gynecological examination (general) (routine) with abnormal findings: Secondary | ICD-10-CM

## 2023-07-25 DIAGNOSIS — Z13220 Encounter for screening for lipoid disorders: Secondary | ICD-10-CM | POA: Diagnosis not present

## 2023-07-25 DIAGNOSIS — Z130 Encounter for screening for diseases of the blood and blood-forming organs and certain disorders involving the immune mechanism: Secondary | ICD-10-CM

## 2023-07-25 DIAGNOSIS — N644 Mastodynia: Secondary | ICD-10-CM

## 2023-07-25 DIAGNOSIS — Z833 Family history of diabetes mellitus: Secondary | ICD-10-CM

## 2023-07-25 DIAGNOSIS — Z1329 Encounter for screening for other suspected endocrine disorder: Secondary | ICD-10-CM | POA: Diagnosis not present

## 2023-07-25 DIAGNOSIS — B9689 Other specified bacterial agents as the cause of diseases classified elsewhere: Secondary | ICD-10-CM

## 2023-07-25 DIAGNOSIS — N898 Other specified noninflammatory disorders of vagina: Secondary | ICD-10-CM | POA: Insufficient documentation

## 2023-07-25 MED ORDER — SUMATRIPTAN SUCCINATE 25 MG PO TABS
25.0000 mg | ORAL_TABLET | ORAL | 0 refills | Status: DC | PRN
Start: 2023-07-25 — End: 2023-08-23

## 2023-07-25 MED ORDER — UBRELVY 50 MG PO TABS: 1.0000 | ORAL_TABLET | Freq: Every day | ORAL | Status: DC | PRN

## 2023-07-25 NOTE — Assessment & Plan Note (Signed)
 Severe migraines 28 days/month. Previous treatments ineffective. Discussed oral and injectable options, including Nurtec, topiramate, amitriptyline, venlafaxine, and sumatriptan. - Prescribe sumatriptan for acute management. - Gave samples of Ubrelvy for patient to try. - Consider topiramate, amitriptyline, venlafaxine, or Ubrelvy/Nurtec/similar for prevention. - Consider neurology referral for injectables if oral options fail.

## 2023-07-25 NOTE — Progress Notes (Signed)
 Established patient visit   Patient: Martha Herrera   DOB: 01/15/1996   27 y.o. Female  MRN: [id number] Visit Date: 07/25/2023  Today's healthcare provider: Sherlyn Hay, DO   Chief Complaint  Patient presents with   Rash    Rash on R leg that's been there for about 6 months it itchy and burns some times    Subjective    Martha Herrera is a 28 y.o. female who presents today for a well-woman exam, plus migraine and rash evaluation.  Rash Pertinent negatives include no congestion, cough, diarrhea, eye pain, fatigue, fever, rhinorrhea, shortness of breath or sore throat.   HPI     Rash    Additional comments: Rash on R leg that's been there for about 6 months it itchy and burns some times       Last edited by Sherlyn Hay, DO on 07/25/2023  2:55 PM.      Martha Herrera is a 28 year old female who presents with a persistent rash and frequent migraines.  She has a persistent rash for over six months, primarily located on the right eye. The rash is red, blistery, and itchy, sometimes with clear fluid. She has used lotion and triamcinolone, which provides some relief, but the rash remains bothersome. Additionally, she has 'mosquito bite-like' lesions all over her body, which also contain clear fluid and do not resolve.  She experiences frequent migraines, occurring approximately 28 days out of the month, with severe intensity. The migraines are typically located behind her eyes and on one side of her head. She was previously prescribed butalbital (Fioricet) and took it frequently until advised to stop due to potential liver issues. She has tried propranolol and naproxen in the past without success. She does not recall trying sumatriptan or other triptans.  She reports lower abdominal pain and dyspareunia, which began after her second C-section. Her menstrual cycles have been irregular, with periods sometimes arriving late and lasting three days. She notes that her periods have been  inconsistent, sometimes early or on time, but recently eight days late.  She experiences mastalgia, which she describes as similar to pregnancy-related discomfort. She avoids wearing underwire or padded bras due to discomfort. She performs regular breast exams and has not noticed any abnormalities.  She reports otalgia and was referred to an ENT but has not yet rescheduled her appointment. She has not received the COVID booster and is unsure about the flu vaccine.    Past Medical History:  Diagnosis Date   Complication of anesthesia    hard to wake up high level with CS   Dysmenorrhea    Gastroesophageal reflux    takes prescribed meds daily   Gestational diabetes    Headache(784.0)    not as often   Heart murmur    Pneumonia 2013   Rubella non-immune status, antepartum    Tinea capitis    Varicella    Past Surgical History:  Procedure Laterality Date   ADENOIDECTOMY     CESAREAN SECTION N/A 08/07/2018   Procedure: CESAREAN SECTION;  Surgeon: Lazaro Arms, MD;  Location: [facility];  Service: Obstetrics;  Laterality: N/A;   CESAREAN SECTION N/A 01/20/2021   Procedure: CESAREAN SECTION;  Surgeon: Kathrynn Running, MD;  Location: [facility];  Service: Obstetrics;  Laterality: N/A;   CESAREAN SECTION WITH BILATERAL TUBAL LIGATION Bilateral 01/20/2021   ESOPHAGOGASTRODUODENOSCOPY  05/03/2012   Procedure: ESOPHAGOGASTRODUODENOSCOPY (EGD);  Surgeon: Jon Gills,  MD;  Location: [facility];  Service: Gastroenterology;  Laterality: N/A;   ESOPHAGOGASTRODUODENOSCOPY N/A 12/15/2021   Procedure: ESOPHAGOGASTRODUODENOSCOPY (EGD);  Surgeon: Sung Amabile, DO;  Location: [facility];  Service: General;  Laterality: N/A;   tubes in ears     WISDOM TOOTH EXTRACTION     Family Status  Relation Name Status   PGF  Deceased   PGM  Deceased   MGM  Alive   MGF  Deceased at age 70       Lung Cancer   Father  Alive   Mother  Alive   Brother 66 yo Alive   Brother 51 yo Alive   Sister 20 Alive    Son  Presenter, broadcasting Aunt  Alive   Mat Uncle  Alive   Pat Aunt  Alive   Nutritional therapist x3 Alive   Daughter  Alive   Neg Hx  (Not Specified)  No partnership data on file   Family History  Problem Relation Age of Onset   GER disease Maternal Grandmother    Hyperlipidemia Maternal Grandmother    Cancer Maternal Grandfather    Diabetes Maternal Grandfather    Hyperlipidemia Maternal Grandfather    Vision loss Maternal Grandfather    GER disease Father    Cancer Father        kidney   Miscarriages / Stillbirths Mother    Headache Mother    Asthma Brother    Miscarriages / [place] Brother    ADD / ADHD Brother    Asthma Son    GER disease Maternal Aunt    Migraines Maternal Aunt    Anxiety disorder Maternal Aunt    Diabetes Paternal Aunt    Cancer Paternal Uncle        spinal cancer   Ataxia Neg Hx    Chorea Neg Hx    Dementia Neg Hx    Mental retardation Neg Hx    Multiple sclerosis Neg Hx    Neurofibromatosis Neg Hx    Neuropathy Neg Hx    Parkinsonism Neg Hx    Seizures Neg Hx    Stroke Neg Hx    Depression Neg Hx    Bipolar disorder Neg Hx    Social History   Socioeconomic History   Marital status: Married    Spouse name: Sway Guttierrez   Number of children: 1   Years of education: Not on file   Highest education level: Associate degree: academic program  Occupational History   Not on file  Tobacco Use   Smoking status: Never   Smokeless tobacco: Never  Vaping Use   Vaping status: Never Used  Substance and Sexual Activity   Alcohol use: No   Drug use: No   Sexual activity: Yes    Birth control/protection: Surgical    Comment: tubal  Other Topics Concern   Not on file  Social History Narrative   10th grade   Social Drivers of Health   Financial Resource Strain: Low Risk  (07/25/2023)   Overall Financial Resource Strain (CARDIA)    Difficulty of Paying Living Expenses: Not hard at all  Food Insecurity: No Food Insecurity (07/25/2023)   Hunger Vital  Sign    Worried About Running Out of Food in the Last Year: Never true    Ran Out of Food in the Last Year: Never true  Transportation Needs: No Transportation Needs (07/25/2023)   PRAPARE - Administrator, Civil Service (Medical): No  Lack of Transportation (Non-Medical): No  Physical Activity: Sufficiently Active (02/18/2021)   Exercise Vital Sign    Days of Exercise per Week: 7 days    Minutes of Exercise per Session: 100 min  Stress: Stress Concern Present (07/25/2023)   [employer] of Occupational Health - Occupational Stress Questionnaire    Feeling of Stress : Rather much  Social Connections: Moderately Integrated (07/25/2023)   Social Connection and Isolation Panel [NHANES]    Frequency of Communication with Friends and Family: More than three times a week    Frequency of Social Gatherings with Friends and Family: More than three times a week    Attends Religious Services: 1 to 4 times per year    Active Member of [employer] or Organizations: No    Attends Banker Meetings: Never    Marital Status: Married   Outpatient Medications Prior to Visit  Medication Sig   albuterol (VENTOLIN HFA) 108 (90 Base) MCG/ACT inhaler Inhale 2 puffs into the lungs every 6 (six) hours as needed for wheezing or shortness of breath.   fluticasone (FLONASE) 50 MCG/ACT nasal spray Place 2 sprays into both nostrils daily.   loratadine (CLARITIN) 10 MG tablet Take 10 mg by mouth daily.   omeprazole (PRILOSEC) 40 MG capsule Take 1 capsule (40 mg total) by mouth daily.   [DISCONTINUED] butalbital-acetaminophen-caffeine (FIORICET) 50-325-40 MG tablet TAKE 1 TO 2 TABLETS BY MOUTH ONCE DAILY AS NEEDED FOR HEADACHE (Patient not taking: Reported on 07/25/2023)   [DISCONTINUED] cyclobenzaprine (FLEXERIL) 10 MG tablet Take 1 tablet (10 mg total) by mouth at bedtime as needed for muscle spasms. Do not take with alcohol or while driving or operating heavy machinery.  May cause drowsiness.  (Patient not taking: Reported on 07/25/2023)   [DISCONTINUED] mupirocin ointment (BACTROBAN) 2 % Apply 1 Application topically 2 (two) times daily. (Patient not taking: Reported on 07/25/2023)   [DISCONTINUED] ondansetron (ZOFRAN-ODT) 4 MG disintegrating tablet Take 1 tablet (4 mg total) by mouth every 8 (eight) hours as needed for nausea or vomiting. (Patient not taking: Reported on 07/25/2023)   No facility-administered medications prior to visit.   Allergies  Allergen Reactions   Penicillins Hives, Itching and Rash    Did it involve swelling of the face/tongue/throat, SOB, or low BP? No Did it involve sudden or severe rash/hives, skin peeling, or any reaction on the inside of your mouth or nose? No Did you need to seek medical attention at a hospital or doctor's office? No When did it last happen?     childhood allergy  If all above answers are "NO", may proceed with cephalosporin use.    Amoxil [Amoxicillin] Itching and Rash    Did it involve swelling of the face/tongue/throat, SOB, or low BP? No Did it involve sudden or severe rash/hives, skin peeling, or any reaction on the inside of your mouth or nose? No Did you need to seek medical attention at a hospital or doctor's office? No When did it last happen?     2019 If all above answers are "NO", may proceed with cephalosporin use.    Immunization History  Administered Date(s) Administered   DTaP 11/07/1995, 03/10/1996, 04/15/1996, 03/31/1997, 06/29/2000   HPV Quadrivalent 09/12/2006, 12/14/2006, 03/05/2007   Hepatitis A, Ped/Adol-2 Dose 09/12/2006, 03/05/2007, 03/14/2013   Hepatitis B, PED/ADOLESCENT 07-09-1995, 11/07/1995, 04/15/1996   IPV 11/07/1995, 03/10/1996, 04/15/1996, 06/29/2000   Influenza Nasal 02/05/2009   Influenza,inj,Quad PF,6+ Mos 01/18/2018, 01/11/2021   MMR 09/03/1996, 06/29/2000, 08/10/2018   Meningococcal Conjugate 09/12/2006  Meningococcal polysaccharide vaccine (MPSV4) 11/30/2011   PFIZER(Purple  Top)SARS-COV-2 Vaccination 11/11/2019   Td 09/12/2006   Tdap 09/12/2006, 04/04/2017, 05/10/2018, 11/04/2020    Health Maintenance  Topic Date Due   COVID-19 Vaccine (2 - 2024-25 season) 01/30/2024 (Originally 12/31/2022)   INFLUENZA VACCINE  11/30/2023   Cervical Cancer Screening (Pap smear)  07/25/2026   DTaP/Tdap/Td (11 - Td or Tdap) 11/05/2030   HPV VACCINES  Completed   Hepatitis C Screening  Completed   HIV Screening  Completed    Patient Care Team: Sherlyn Hay, DO as PCP - General (Family Medicine)  Review of Systems  Constitutional:  Negative for chills, fatigue and fever.  HENT:  Negative for congestion, ear pain, rhinorrhea, sneezing and sore throat.   Eyes: Negative.  Negative for pain and redness.  Respiratory:  Negative for cough, shortness of breath and wheezing.   Cardiovascular:  Negative for chest pain and leg swelling.  Gastrointestinal:  Negative for abdominal pain, blood in stool, constipation, diarrhea and nausea.  Endocrine: Negative for polydipsia and polyphagia.  Genitourinary: Negative.  Negative for dysuria, flank pain, hematuria, pelvic pain, vaginal bleeding and vaginal discharge.  Musculoskeletal:  Negative for arthralgias, back pain, gait problem and joint swelling.  Skin:  Positive for rash.  Neurological:  Positive for headaches. Negative for dizziness, tremors, seizures, weakness, light-headedness and numbness.  Hematological:  Negative for adenopathy.  Psychiatric/Behavioral: Negative.  Negative for behavioral problems, confusion and dysphoric mood. The patient is not nervous/anxious and is not hyperactive.         Objective    BP 121/82   Pulse 97   Ht 4\' 10"  (1.473 m)   Wt 208 lb (94.3 kg)   SpO2 99%   BMI 43.47 kg/m     Physical Exam Vitals and nursing note reviewed.  Constitutional:      General: She is awake.     Appearance: Normal appearance.  HENT:     Head: Normocephalic and atraumatic.     Right Ear: Tympanic  membrane, ear canal and external ear normal.     Left Ear: Tympanic membrane, ear canal and external ear normal.     Nose: Nose normal.     Mouth/Throat:     Mouth: Mucous membranes are moist.     Pharynx: Oropharynx is clear. No oropharyngeal exudate or posterior oropharyngeal erythema.  Eyes:     General: No scleral icterus.    Extraocular Movements: Extraocular movements intact.     Conjunctiva/sclera: Conjunctivae normal.     Pupils: Pupils are equal, round, and reactive to light.  Neck:     Thyroid: No thyromegaly or thyroid tenderness.  Cardiovascular:     Rate and Rhythm: Normal rate and regular rhythm.     Pulses: Normal pulses.     Heart sounds: Normal heart sounds.  Pulmonary:     Effort: Pulmonary effort is normal. No tachypnea, bradypnea or respiratory distress.     Breath sounds: Normal breath sounds. No stridor. No wheezing, rhonchi or rales.  Abdominal:     General: Bowel sounds are normal. There is no distension.     Palpations: Abdomen is soft. There is no mass.     Tenderness: There is no abdominal tenderness. There is no guarding.     Hernia: No hernia is present.  Musculoskeletal:     Cervical back: Normal range of motion and neck supple.     Right lower leg: No edema.     Left lower leg: No edema.  Lymphadenopathy:     Cervical: No cervical adenopathy.  Skin:    General: Skin is warm and dry.     Findings: Rash present. Rash is urticarial and vesicular.       Neurological:     Mental Status: She is alert and oriented to person, place, and time. Mental status is at baseline.  Psychiatric:        Mood and Affect: Mood normal.        Behavior: Behavior normal.     Depression Screen    07/25/2023    2:07 PM 08/14/2022   10:56 AM 06/14/2022    4:16 PM 05/10/2022    1:14 PM  PHQ 2/9 Scores  PHQ - 2 Score 4 0 1 0  PHQ- 9 Score 5 4 2  0   Results for orders placed or performed in visit on 07/25/23  Comprehensive metabolic panel  Result Value Ref Range    Glucose 85 70 - 99 mg/dL   BUN 16 6 - 20 mg/dL   Creatinine, Ser 6.57 0.57 - 1.00 mg/dL   eGFR 846 >96 EX/BMW/4.13   BUN/Creatinine Ratio 21 9 - 23   Sodium 139 134 - 144 mmol/L   Potassium 3.8 3.5 - 5.2 mmol/L   Chloride 101 96 - 106 mmol/L   CO2 23 20 - 29 mmol/L   Calcium 9.3 8.7 - 10.2 mg/dL   Total Protein 7.0 6.0 - 8.5 g/dL   Albumin 4.4 4.0 - 5.0 g/dL   Globulin, Total 2.6 1.5 - 4.5 g/dL   Bilirubin Total 0.2 0.0 - 1.2 mg/dL   Alkaline Phosphatase 125 (H) 44 - 121 IU/L   AST 22 0 - 40 IU/L   ALT 32 0 - 32 IU/L  Hemoglobin A1c  Result Value Ref Range   Hgb A1c MFr Bld 5.2 4.8 - 5.6 %   Est. average glucose Bld gHb Est-mCnc 103 mg/dL  Lipid panel  Result Value Ref Range   Cholesterol, Total 165 100 - 199 mg/dL   Triglycerides 87 0 - 149 mg/dL   HDL 50 >24 mg/dL   VLDL Cholesterol Cal 16 5 - 40 mg/dL   LDL Chol Calc (NIH) 99 0 - 99 mg/dL   Chol/HDL Ratio 3.3 0.0 - 4.4 ratio  Cytology - PAP  Result Value Ref Range   Adequacy      Satisfactory for evaluation; transformation zone component PRESENT.   Diagnosis      - Negative for intraepithelial lesion or malignancy (NILM)  Cervicovaginal ancillary only  Result Value Ref Range   Neisseria Gonorrhea Negative    Chlamydia Negative    Trichomonas Negative    Bacterial Vaginitis (gardnerella) Positive (A)    Candida Vaginitis Negative    Candida Glabrata Negative    Comment Normal Reference Range Candida Species - Negative    Comment Normal Reference Range Candida Galbrata - Negative    Comment Normal Reference Range Trichomonas - Negative    Comment Normal Reference Ranger Chlamydia - Negative    Comment      Normal Reference Range Neisseria Gonorrhea - Negative   Comment      Normal Reference Range Bacterial Vaginosis - Negative    Assessment & Plan     Well woman exam with routine gynecological exam -     Cytology - PAP  Chronic migraine without aura without status migrainosus, not intractable Assessment &  Plan: Severe migraines 28 days/month. Previous treatments ineffective. Discussed oral and injectable options, including Nurtec, topiramate,  amitriptyline, venlafaxine, and sumatriptan. - Prescribe sumatriptan for acute management. - Gave samples of Ubrelvy for patient to try. - Consider topiramate, amitriptyline, venlafaxine, or Ubrelvy/Nurtec/similar for prevention. - Consider neurology referral for injectables if oral options fail.  Orders: Bernita Raisin; Take 1 tablet (50 mg total) by mouth daily as needed. -     SUMAtriptan Succinate; Take 1 tablet (25 mg total) by mouth every 2 (two) hours as needed for migraine. May repeat in 2 hours if headache persists or recurs. (Max 2 doses per 24 hours)  Dispense: 10 tablet; Refill: 0  Vaginal discharge -     Cervicovaginal ancillary only  Bacterial vaginosis -     metroNIDAZOLE; Take 1 tablet (500 mg total) by mouth 2 (two) times daily.  Dispense: 14 tablet; Refill: 0  Rash Assessment & Plan: Right shin. Does not appear fungal in nature. Some improvement with triamcinolone, but continues to wax and wane. - refer to dermatology for further evaluation/management.  Orders: -     Ambulatory referral to Dermatology  Mastalgia  Screening for lipid disorders -     Lipid panel  Screening for endocrine, metabolic and immunity disorder -     Comprehensive metabolic panel with GFR -     Hemoglobin A1c  History of gestational diabetes -     Hemoglobin A1c  Family history of diabetes mellitus (DM) -     Hemoglobin A1c    Well woman exam Uncertain influenza and COVID booster status. Pap smear due.  Vaginal discharge noted on exam.   - Administer Pap smear. - Document influenza vaccine declined. - Discuss COVID booster availability.  Vaginal discharge bacterial Vaginosis Suspected bacterial vaginosis with discharge and dyspareunia. Explained bacterial imbalance, not necessarily sexually transmitted. - Prescribe antibiotics. - Send swab  for confirmation.  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 Discussed hormonal regulation via contraceptives. Tubal ligation in place. - Consider hormonal evaluation and management. - Discuss contraceptive options.    Return in about 3 months (around 10/25/2023) for migraines.     I discussed the assessment and treatment plan with the patient  The patient was provided an opportunity to ask questions and all were answered. The patient agreed with the plan and demonstrated an understanding of the instructions.   The patient was advised to call back or seek an in-person evaluation if the symptoms worsen or if the condition fails to improve as anticipated.    Sherlyn Hay, DO  [facility] [phone] (phone) [phone] (fax)  [facility]

## 2023-07-25 NOTE — Assessment & Plan Note (Signed)
 Right shin. Does not appear fungal in nature. Some improvement with triamcinolone, but continues to wax and wane. - refer to dermatology for further evaluation/management.

## 2023-07-26 LAB — COMPREHENSIVE METABOLIC PANEL WITH GFR
ALT: 32 IU/L (ref 0–32)
AST: 22 IU/L (ref 0–40)
Albumin: 4.4 g/dL (ref 4.0–5.0)
Alkaline Phosphatase: 125 IU/L — ABNORMAL HIGH (ref 44–121)
BUN/Creatinine Ratio: 21 (ref 9–23)
BUN: 16 mg/dL (ref 6–20)
Bilirubin Total: 0.2 mg/dL (ref 0.0–1.2)
CO2: 23 mmol/L (ref 20–29)
Calcium: 9.3 mg/dL (ref 8.7–10.2)
Chloride: 101 mmol/L (ref 96–106)
Creatinine, Ser: 0.77 mg/dL (ref 0.57–1.00)
Globulin, Total: 2.6 g/dL (ref 1.5–4.5)
Glucose: 85 mg/dL (ref 70–99)
Potassium: 3.8 mmol/L (ref 3.5–5.2)
Sodium: 139 mmol/L (ref 134–144)
Total Protein: 7 g/dL (ref 6.0–8.5)
eGFR: 108 mL/min/{1.73_m2} (ref >59)

## 2023-07-26 LAB — LIPID PANEL
Chol/HDL Ratio: 3.3 ratio (ref 0.0–4.4)
Cholesterol, Total: 165 mg/dL (ref 100–199)
HDL: 50 mg/dL (ref >39)
LDL Chol Calc (NIH): 99 mg/dL (ref 0–99)
Triglycerides: 87 mg/dL (ref 0–149)
VLDL Cholesterol Cal: 16 mg/dL (ref 5–40)

## 2023-07-26 LAB — CERVICOVAGINAL ANCILLARY ONLY
Bacterial Vaginitis (gardnerella): POSITIVE — AB
Candida Glabrata: NEGATIVE
Candida Vaginitis: NEGATIVE
Chlamydia: NEGATIVE
Comment: NEGATIVE
Comment: NEGATIVE
Comment: NEGATIVE
Comment: NEGATIVE
Comment: NEGATIVE
Comment: NORMAL
Neisseria Gonorrhea: NEGATIVE
Trichomonas: NEGATIVE

## 2023-07-26 LAB — HEMOGLOBIN A1C
Est. average glucose Bld gHb Est-mCnc: 103 mg/dL
Hgb A1c MFr Bld: 5.2 % (ref 4.8–5.6)

## 2023-07-27 ENCOUNTER — Encounter: Payer: Self-pay | Admitting: Family Medicine

## 2023-07-27 LAB — CYTOLOGY - PAP: Diagnosis: NEGATIVE

## 2023-07-27 MED ORDER — METRONIDAZOLE 500 MG PO TABS
500.0000 mg | ORAL_TABLET | Freq: Two times a day (BID) | ORAL | 0 refills | Status: DC
Start: 2023-07-27 — End: 2023-11-12

## 2023-08-03 ENCOUNTER — Encounter: Payer: Self-pay | Admitting: Family Medicine

## 2023-08-06 ENCOUNTER — Encounter: Payer: Self-pay | Admitting: Family Medicine

## 2023-08-06 ENCOUNTER — Other Ambulatory Visit: Payer: Self-pay | Admitting: Family Medicine

## 2023-08-06 DIAGNOSIS — Z01419 Encounter for gynecological examination (general) (routine) without abnormal findings: Secondary | ICD-10-CM | POA: Insufficient documentation

## 2023-08-06 DIAGNOSIS — G43709 Chronic migraine without aura, not intractable, without status migrainosus: Secondary | ICD-10-CM

## 2023-08-23 ENCOUNTER — Other Ambulatory Visit: Payer: Self-pay

## 2023-08-23 ENCOUNTER — Telehealth: Payer: Self-pay | Admitting: Family Medicine

## 2023-08-23 DIAGNOSIS — G43709 Chronic migraine without aura, not intractable, without status migrainosus: Secondary | ICD-10-CM

## 2023-08-23 NOTE — Telephone Encounter (Signed)
 [facility] is requesting refill SUMAtriptan  (IMITREX ) 25 MG tablet  Please advise

## 2023-08-23 NOTE — Telephone Encounter (Signed)
 Converted into a refill medication

## 2023-08-24 MED ORDER — SUMATRIPTAN SUCCINATE 25 MG PO TABS: 25.0000 mg | ORAL_TABLET | ORAL | 0 refills | Status: AC | PRN

## 2023-08-28 DIAGNOSIS — R1030 Lower abdominal pain, unspecified: Secondary | ICD-10-CM | POA: Diagnosis not present

## 2023-08-29 DIAGNOSIS — R1030 Lower abdominal pain, unspecified: Secondary | ICD-10-CM | POA: Diagnosis not present

## 2023-09-13 DIAGNOSIS — L2089 Other atopic dermatitis: Secondary | ICD-10-CM | POA: Diagnosis not present

## 2023-10-25 ENCOUNTER — Ambulatory Visit: Admitting: Family Medicine

## 2023-11-03 ENCOUNTER — Other Ambulatory Visit: Payer: Self-pay

## 2023-11-03 ENCOUNTER — Emergency Department

## 2023-11-03 ENCOUNTER — Emergency Department
Admission: EM | Admit: 2023-11-03 | Discharge: 2023-11-04 | Disposition: A | Discharge status: Home / Self Care | Attending: Emergency Medicine | Admitting: Emergency Medicine

## 2023-11-03 DIAGNOSIS — R1011 Right upper quadrant pain: Secondary | ICD-10-CM

## 2023-11-03 DIAGNOSIS — K760 Fatty (change of) liver, not elsewhere classified: Secondary | ICD-10-CM | POA: Diagnosis not present

## 2023-11-03 DIAGNOSIS — K8020 Calculus of gallbladder without cholecystitis without obstruction: Secondary | ICD-10-CM | POA: Insufficient documentation

## 2023-11-03 LAB — CBC
HCT: 36.7 % (ref 36.0–46.0)
Hemoglobin: 11.8 g/dL — ABNORMAL LOW (ref 12.0–15.0)
MCH: 24.9 pg — ABNORMAL LOW (ref 26.0–34.0)
MCHC: 32.2 g/dL (ref 30.0–36.0)
MCV: 77.6 fL — ABNORMAL LOW (ref 80.0–100.0)
Platelets: 316 K/uL (ref 150–400)
RBC: 4.73 MIL/uL (ref 3.87–5.11)
RDW: 13.9 % (ref 11.5–15.5)
WBC: 6.3 K/uL (ref 4.0–10.5)
nRBC: 0 % (ref 0.0–0.2)

## 2023-11-03 LAB — COMPREHENSIVE METABOLIC PANEL WITH GFR
ALT: 50 U/L — ABNORMAL HIGH (ref 0–44)
AST: 32 U/L (ref 15–41)
Albumin: 3.7 g/dL (ref 3.5–5.0)
Alkaline Phosphatase: 110 U/L (ref 38–126)
Anion gap: 10 (ref 5–15)
BUN: 21 mg/dL — ABNORMAL HIGH (ref 6–20)
CO2: 26 mmol/L (ref 22–32)
Calcium: 9.1 mg/dL (ref 8.9–10.3)
Chloride: 103 mmol/L (ref 98–111)
Creatinine, Ser: 0.71 mg/dL (ref 0.44–1.00)
GFR, Estimated: >60 mL/min (ref >60)
Glucose, Bld: 80 mg/dL (ref 70–99)
Potassium: 3.3 mmol/L — ABNORMAL LOW (ref 3.5–5.1)
Sodium: 139 mmol/L (ref 135–145)
Total Bilirubin: 0.3 mg/dL (ref 0.0–1.2)
Total Protein: 7.1 g/dL (ref 6.5–8.1)

## 2023-11-03 LAB — LIPASE, BLOOD: Lipase: 27 U/L (ref 11–51)

## 2023-11-03 NOTE — ED Provider Notes (Signed)
 [facility] Provider Note    Event Date/Time   First MD Initiated Contact with Patient 11/03/23 2324     (approximate)   History   Abdominal Pain   HPI  Martha Herrera is a 28 y.o. female with history of gallstones, GERD, presenting with 3 days of right sided abdominal pain associated with nausea vomiting.  She describes some soft stools but no diarrhea.  She denies any hematuria, no dysuria or other urinary symptoms, no fever.  No chest pain or cough.  States that she does have history of gallstones, has not followed up with the surgeon for it.  On intermittent chart review, she was seen by gastroenterology at the end of April, was complaining about lower abdominal pain at that time, said that it felt like burning.  They placed orders for celiac disease panel as well as fecal calprotectin.     Physical Exam   Triage Vital Signs: ED Triage Vitals  Encounter Vitals Group     BP 11/03/23 2108 (!) 141/96     Girls Systolic BP Percentile --      Girls Diastolic BP Percentile --      Boys Systolic BP Percentile --      Boys Diastolic BP Percentile --      Pulse Rate 11/03/23 2108 78     Resp 11/03/23 2108 17     Temp 11/03/23 2108 99.2 F (37.3 C)     Temp Source 11/03/23 2108 Oral     SpO2 11/03/23 2108 100 %     Weight --      Height --      Head Circumference --      Peak Flow --      Pain Score 11/03/23 2110 8     Pain Loc --      Pain Education --      Exclude from Growth Chart --     Most recent vital signs: Vitals:   11/03/23 2108  BP: (!) 141/96  Pulse: 78  Resp: 17  Temp: 99.2 F (37.3 C)  SpO2: 100%     General: Awake, no distress.  CV:  Good peripheral perfusion.  Resp:  Normal effort.  Abd:  No distention.  Soft, tender at right upper quadrant, no R mid or lower abdominal tenderness on exam. Other:  No flank tenderness, no CVA tenderness bilaterally, no overlying rash   ED Results / Procedures / Treatments    Labs (all labs ordered are listed, but only abnormal results are displayed) Labs Reviewed  COMPREHENSIVE METABOLIC PANEL WITH GFR - Abnormal; Notable for the following components:      Result Value   Potassium 3.3 (*)    BUN 21 (*)    ALT 50 (*)    All other components within normal limits  CBC - Abnormal; Notable for the following components:   Hemoglobin 11.8 (*)    MCV 77.6 (*)    MCH 24.9 (*)    All other components within normal limits  LIPASE, BLOOD  URINALYSIS, ROUTINE W REFLEX MICROSCOPIC     EKG  EKG shows, ***   RADIOLOGY On my independent interpretation, ***   PROCEDURES:  Critical Care performed: {CriticalCareYesNo:19197::Yes, see critical care procedure note(s),No}  Procedures   MEDICATIONS ORDERED IN ED: Medications - No data to display   IMPRESSION / MDM / ASSESSMENT AND PLAN / ED COURSE  I reviewed the triage vital signs and the nursing notes.  Differential diagnosis includes, but is not limited to, biliary colic, cholecystitis, GERD, pancreatitis, UTI, also considered nephrolithiasis but patient does not have any flank or lower abdominal pain.  Will start with right upper quadrant ultrasound, labs, urine pregnancy, UA.  Patient's presentation is most consistent with acute presentation with potential threat to life or bodily function.  Independent interpretation of labs and imaging below. ***  The patient is on the cardiac monitor to evaluate for evidence of arrhythmia and/or significant heart rate changes.       FINAL CLINICAL IMPRESSION(S) / ED DIAGNOSES   Final diagnoses:  None     Rx / DC Orders   ED Discharge Orders     None        Note:  This document was prepared using Dragon voice recognition software and may include unintentional dictation errors.

## 2023-11-03 NOTE — ED Triage Notes (Signed)
 Pt is coming in for right lower/flank pain that has been going on for 3 days, she mentions that she has gallstones and has had a Hx of flare ups with her gallstones. She has had a loss of appetite over the last few days as well with vomiting and nausea.

## 2023-11-04 LAB — URINALYSIS, ROUTINE W REFLEX MICROSCOPIC
Bilirubin Urine: NEGATIVE
Glucose, UA: NEGATIVE mg/dL
Hgb urine dipstick: NEGATIVE
Ketones, ur: NEGATIVE mg/dL
Leukocytes,Ua: NEGATIVE
Nitrite: NEGATIVE
Protein, ur: NEGATIVE mg/dL
Specific Gravity, Urine: 1.03 (ref 1.005–1.030)
pH: 5 (ref 5.0–8.0)

## 2023-11-04 LAB — PREGNANCY, URINE: Preg Test, Ur: NEGATIVE

## 2023-11-04 MED ORDER — ALUM & MAG HYDROXIDE-SIMETH 200-200-20 MG/5ML PO SUSP
30.0000 mL | Freq: Once | ORAL | Status: AC
  Administered 2023-11-04: 30 mL via ORAL
  Filled 2023-11-04: qty 30

## 2023-11-04 MED ORDER — KETOROLAC TROMETHAMINE 30 MG/ML IJ SOLN
15.0000 mg | Freq: Once | INTRAMUSCULAR | Status: AC
  Administered 2023-11-04: 15 mg via INTRAMUSCULAR
  Filled 2023-11-04: qty 1

## 2023-11-04 MED ORDER — ACETAMINOPHEN 500 MG PO TABS
1000.0000 mg | ORAL_TABLET | Freq: Once | ORAL | Status: AC
  Administered 2023-11-04: 1000 mg via ORAL
  Filled 2023-11-04: qty 2

## 2023-11-04 NOTE — Discharge Instructions (Signed)
 You can take 650 mg of Tylenol  or 100 mg of ibuprofen  every 6 hours as needed for pain.  Please avoid any greasy or fatty foods, also avoid anything acidic or creamy that could exacerbate your symptoms.  Please make sure to call surgery on Monday to schedule a follow-up appointment.

## 2023-11-08 ENCOUNTER — Telehealth: Payer: Self-pay

## 2023-11-08 NOTE — Telephone Encounter (Signed)
 Copied from CRM [id number]. Topic: Clinical - Medical Advice >> Nov 07, 2023  4:42 PM Chasity T wrote: Reason for CRM: Patient is calling in because she is needing a new prescription for migraines due to the current one not working. Please contact her back to discuss.

## 2023-11-09 NOTE — Telephone Encounter (Signed)
 [facility] faxed refill request for the following medications:   SUMAtriptan  (IMITREX ) 25 MG tablet    Please advise

## 2023-11-12 ENCOUNTER — Ambulatory Visit: Admitting: Surgery

## 2023-11-12 ENCOUNTER — Encounter: Payer: Self-pay | Admitting: Surgery

## 2023-11-12 VITALS — BP 130/85 | HR 86 | Temp 98.7°F | Ht <= 58 in | Wt 211.2 lb

## 2023-11-12 DIAGNOSIS — K8020 Calculus of gallbladder without cholecystitis without obstruction: Secondary | ICD-10-CM

## 2023-11-12 DIAGNOSIS — K8010 Calculus of gallbladder with chronic cholecystitis without obstruction: Secondary | ICD-10-CM | POA: Diagnosis not present

## 2023-11-12 NOTE — H&P (View-Only) (Signed)
 Patient ID: Martha Herrera, female   DOB: [birth year], 28 y.o.   MRN: [id number]  HPI Martha Herrera is a 28 y.o. female in consultation at the request of Dr. Waymond.  She came to the emergency room 9 days ago due to a gallbladder attack.  She reports that she was having 3 days of right-sided abdominal pain associated with nausea.  The pain was moderate and sharp located in the right upper quadrant.  She continues to have daily pains that are exacerbated by heavy meals.  She also endorses nausea, No Fevers no chills no evidence of biliary obstruction.  She did have an ultrasound that I personally reviewed showing evidence of cholelithiasis.  Normal cbd, bilirubin and alkaline phosphatase, CBC only revealed mild anemia. Her history of 2 C-sections. multiple family members with gallbladder issues.  2 sister with gallbladder disease and mother with gallbladder disease.  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  HPI  Past Medical History:  Diagnosis Date   Complication of anesthesia    hard to wake up high level with CS   Dysmenorrhea    Gastroesophageal reflux    takes prescribed meds daily   Gestational diabetes    Headache(784.0)    not as often   Heart murmur    Pneumonia 2013   Rubella non-immune status, antepartum    Tinea capitis    Varicella     Past Surgical History:  Procedure Laterality Date   ADENOIDECTOMY     CESAREAN SECTION N/A 08/07/2018   Procedure: CESAREAN SECTION;  Surgeon: Martha Vonn DEL, MD;  Location: [facility];  Service: Obstetrics;  Laterality: N/A;   CESAREAN SECTION N/A 01/20/2021   Procedure: CESAREAN SECTION;  Surgeon: Martha Devaughn Sayres, MD;  Location: [facility];  Service: Obstetrics;  Laterality: N/A;   CESAREAN SECTION WITH BILATERAL TUBAL LIGATION Bilateral 01/20/2021   ESOPHAGOGASTRODUODENOSCOPY  05/03/2012   Procedure: ESOPHAGOGASTRODUODENOSCOPY  (EGD);  Surgeon: Martha Herrera Gaskins, MD;  Location: [facility] OR;  Service: Gastroenterology;  Laterality: N/A;   ESOPHAGOGASTRODUODENOSCOPY N/A 12/15/2021   Procedure: ESOPHAGOGASTRODUODENOSCOPY (EGD);  Surgeon: Martha Millet, DO;  Location: [facility] ENDOSCOPY;  Service: General;  Laterality: N/A;   tubes in ears     WISDOM TOOTH EXTRACTION      Family History  Problem Relation Age of Onset   GER disease Maternal Grandmother    Hyperlipidemia Maternal Grandmother    Cancer Maternal Grandfather    Diabetes Maternal Grandfather    Hyperlipidemia Maternal Grandfather    Vision loss Maternal Grandfather    GER disease Father    Cancer Father        kidney   Miscarriages / Stillbirths Mother    Headache Mother    Asthma Brother    Miscarriages / [place] Brother    ADD / ADHD Brother    Asthma Son    GER disease Maternal Aunt    Migraines Maternal Aunt    Anxiety disorder Maternal Aunt    Diabetes Paternal Aunt    Cancer Paternal Uncle        spinal cancer   Ataxia Neg Hx    Chorea Neg Hx    Dementia Neg Hx    Mental retardation Neg Hx    Multiple sclerosis Neg Hx    Neurofibromatosis Neg Hx    Neuropathy Neg Hx    Parkinsonism Neg  Hx    Seizures Neg Hx    Stroke Neg Hx    Depression Neg Hx    Bipolar disorder Neg Hx     Social History Social History   Tobacco Use   Smoking status: Never   Smokeless tobacco: Never  Vaping Use   Vaping status: Never Used  Substance Use Topics   Alcohol use: No   Drug use: No    Allergies  Allergen Reactions   Penicillins Hives, Itching and Rash    Did it involve swelling of the face/tongue/throat, SOB, or low BP? No Did it involve sudden or severe rash/hives, skin peeling, or any reaction on the inside of your mouth or nose? No Did you need to seek medical attention at a hospital or doctor's office? No When did it last happen?     childhood allergy  If all above answers are NO, may proceed with cephalosporin use.    Amoxil  [Amoxicillin] Itching and Rash    Did it involve swelling of the face/tongue/throat, SOB, or low BP? No Did it involve sudden or severe rash/hives, skin peeling, or any reaction on the inside of your mouth or nose? No Did you need to seek medical attention at a hospital or doctor's office? No When did it last happen?     2019 If all above answers are NO, may proceed with cephalosporin use.    Current Outpatient Medications  Medication Sig Dispense Refill   loratadine  (CLARITIN ) 10 MG tablet Take 10 mg by mouth daily.     omeprazole  (PRILOSEC) 40 MG capsule Take 1 capsule (40 mg total) by mouth daily. 90 capsule 3   SUMAtriptan  (IMITREX ) 25 MG tablet Take 1 tablet (25 mg total) by mouth every 2 (two) hours as needed for migraine. May repeat in 2 hours if headache persists or recurs. (Max 2 doses per 24 hours) 10 tablet 0   No current facility-administered medications for this visit.     Review of Systems Full ROS  was asked and was negative except for the information on the HPI  Physical Exam Blood pressure 130/85, pulse 86, temperature 98.7 F (37.1 C), temperature source Oral, height 4' 10 (1.473 m), weight 211 lb 3.2 oz (95.8 kg), SpO2 98%. CONSTITUTIONAL: NAD. EYES: Pupils are equal, round, Sclera are non-icteric. EARS, NOSE, MOUTH AND THROAT: The oropharynx is clear. The oral mucosa is pink and moist. Hearing is intact to voice. LYMPH NODES:  Lymph nodes in the neck are normal. RESPIRATORY:  Lungs are clear. There is normal respiratory effort, with equal breath sounds bilaterally, and without pathologic use of accessory muscles. CARDIOVASCULAR: Heart is regular without murmurs, gallops, or rubs. GI: The abdomen is  soft, mild tenderness to palpation in the right upper quadrant not clear cut signs acute cholecystitis , there are no palpable masses. There is no hepatosplenomegaly. There are normal bowel sounds  GU: Rectal deferred.   MUSCULOSKELETAL: Normal muscle strength and  tone. No cyanosis or edema.   SKIN: Turgor is good and there are no pathologic skin lesions or ulcers. NEUROLOGIC: Motor and sensation is grossly normal. Cranial nerves are grossly intact. PSYCH:  Oriented to person, place and time. Affect is normal.  Data Reviewed I have personally reviewed the patient's imaging, laboratory findings and medical records.    Assessment/Plan 28 year old very pleasant female with chronic cholecystitis versus biliary colic.  Symptoms are crescendo and I do think that prompt cholecystectomy is indicated.  Discussed with her in detail about her disease  process and about the need for cholecystectomy.  I do think that she is a good candidate for robotic approach The risks, benefits, complications, treatment options, and expected outcomes were discussed with the patient. The possibilities of bleeding, recurrent infection, finding a normal gallbladder, perforation of viscus organs, damage to surrounding structures, bile leak, abscess formation, needing a drain placed, the need for additional procedures, reaction to medication, pulmonary aspiration,  failure to diagnose a condition, the possible need to convert to an open procedure, and creating a complication requiring transfusion or operation were discussed with the patient. The patient and/or family concurred with the proposed plan, giving informed consent.   I personally spent a total of 60 minutes in the care of the patient today including performing a medically appropriate exam/evaluation, counseling and educating, placing orders, referring and communicating with other health care professionals, documenting clinical information in the EHR, independently interpreting and reviewing images studies and coordinating care.  She wishes to have this done next week  Laneta Luna, MD FACS General Surgeon 11/12/2023, 3:03 PM

## 2023-11-12 NOTE — Progress Notes (Signed)
 Patient ID: Martha Herrera, female   DOB: [birth year], 28 y.o.   MRN: [id number]  HPI Martha Herrera is a 28 y.o. female in consultation at the request of Dr. Waymond.  She came to the emergency room 9 days ago due to a gallbladder attack.  She reports that she was having 3 days of right-sided abdominal pain associated with nausea.  The pain was moderate and sharp located in the right upper quadrant.  She continues to have daily pains that are exacerbated by heavy meals.  She also endorses nausea, No Fevers no chills no evidence of biliary obstruction.  She did have an ultrasound that I personally reviewed showing evidence of cholelithiasis.  Normal cbd, bilirubin and alkaline phosphatase, CBC only revealed mild anemia. Her history of 2 C-sections. multiple family members with gallbladder issues.  2 sister with gallbladder disease and mother with gallbladder disease.  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  HPI  Past Medical History:  Diagnosis Date   Complication of anesthesia    hard to wake up high level with CS   Dysmenorrhea    Gastroesophageal reflux    takes prescribed meds daily   Gestational diabetes    Headache(784.0)    not as often   Heart murmur    Pneumonia 2013   Rubella non-immune status, antepartum    Tinea capitis    Varicella     Past Surgical History:  Procedure Laterality Date   ADENOIDECTOMY     CESAREAN SECTION N/A 08/07/2018   Procedure: CESAREAN SECTION;  Surgeon: Jayne Vonn DEL, MD;  Location: [facility];  Service: Obstetrics;  Laterality: N/A;   CESAREAN SECTION N/A 01/20/2021   Procedure: CESAREAN SECTION;  Surgeon: Kandis Devaughn Sayres, MD;  Location: [facility];  Service: Obstetrics;  Laterality: N/A;   CESAREAN SECTION WITH BILATERAL TUBAL LIGATION Bilateral 01/20/2021   ESOPHAGOGASTRODUODENOSCOPY  05/03/2012   Procedure: ESOPHAGOGASTRODUODENOSCOPY  (EGD);  Surgeon: Fairy DEL Gaskins, MD;  Location: [facility] OR;  Service: Gastroenterology;  Laterality: N/A;   ESOPHAGOGASTRODUODENOSCOPY N/A 12/15/2021   Procedure: ESOPHAGOGASTRODUODENOSCOPY (EGD);  Surgeon: Tye Millet, DO;  Location: [facility] ENDOSCOPY;  Service: General;  Laterality: N/A;   tubes in ears     WISDOM TOOTH EXTRACTION      Family History  Problem Relation Age of Onset   GER disease Maternal Grandmother    Hyperlipidemia Maternal Grandmother    Cancer Maternal Grandfather    Diabetes Maternal Grandfather    Hyperlipidemia Maternal Grandfather    Vision loss Maternal Grandfather    GER disease Father    Cancer Father        kidney   Miscarriages / Stillbirths Mother    Headache Mother    Asthma Brother    Miscarriages / [place] Brother    ADD / ADHD Brother    Asthma Son    GER disease Maternal Aunt    Migraines Maternal Aunt    Anxiety disorder Maternal Aunt    Diabetes Paternal Aunt    Cancer Paternal Uncle        spinal cancer   Ataxia Neg Hx    Chorea Neg Hx    Dementia Neg Hx    Mental retardation Neg Hx    Multiple sclerosis Neg Hx    Neurofibromatosis Neg Hx    Neuropathy Neg Hx    Parkinsonism Neg  Hx    Seizures Neg Hx    Stroke Neg Hx    Depression Neg Hx    Bipolar disorder Neg Hx     Social History Social History   Tobacco Use   Smoking status: Never   Smokeless tobacco: Never  Vaping Use   Vaping status: Never Used  Substance Use Topics   Alcohol use: No   Drug use: No    Allergies  Allergen Reactions   Penicillins Hives, Itching and Rash    Did it involve swelling of the face/tongue/throat, SOB, or low BP? No Did it involve sudden or severe rash/hives, skin peeling, or any reaction on the inside of your mouth or nose? No Did you need to seek medical attention at a hospital or doctor's office? No When did it last happen?     childhood allergy  If all above answers are NO, may proceed with cephalosporin use.    Amoxil  [Amoxicillin] Itching and Rash    Did it involve swelling of the face/tongue/throat, SOB, or low BP? No Did it involve sudden or severe rash/hives, skin peeling, or any reaction on the inside of your mouth or nose? No Did you need to seek medical attention at a hospital or doctor's office? No When did it last happen?     2019 If all above answers are NO, may proceed with cephalosporin use.    Current Outpatient Medications  Medication Sig Dispense Refill   loratadine  (CLARITIN ) 10 MG tablet Take 10 mg by mouth daily.     omeprazole  (PRILOSEC) 40 MG capsule Take 1 capsule (40 mg total) by mouth daily. 90 capsule 3   SUMAtriptan  (IMITREX ) 25 MG tablet Take 1 tablet (25 mg total) by mouth every 2 (two) hours as needed for migraine. May repeat in 2 hours if headache persists or recurs. (Max 2 doses per 24 hours) 10 tablet 0   No current facility-administered medications for this visit.     Review of Systems Full ROS  was asked and was negative except for the information on the HPI  Physical Exam Blood pressure 130/85, pulse 86, temperature 98.7 F (37.1 C), temperature source Oral, height 4' 10 (1.473 m), weight 211 lb 3.2 oz (95.8 kg), SpO2 98%. CONSTITUTIONAL: NAD. EYES: Pupils are equal, round, Sclera are non-icteric. EARS, NOSE, MOUTH AND THROAT: The oropharynx is clear. The oral mucosa is pink and moist. Hearing is intact to voice. LYMPH NODES:  Lymph nodes in the neck are normal. RESPIRATORY:  Lungs are clear. There is normal respiratory effort, with equal breath sounds bilaterally, and without pathologic use of accessory muscles. CARDIOVASCULAR: Heart is regular without murmurs, gallops, or rubs. GI: The abdomen is  soft, mild tenderness to palpation in the right upper quadrant not clear cut signs acute cholecystitis , there are no palpable masses. There is no hepatosplenomegaly. There are normal bowel sounds  GU: Rectal deferred.   MUSCULOSKELETAL: Normal muscle strength and  tone. No cyanosis or edema.   SKIN: Turgor is good and there are no pathologic skin lesions or ulcers. NEUROLOGIC: Motor and sensation is grossly normal. Cranial nerves are grossly intact. PSYCH:  Oriented to person, place and time. Affect is normal.  Data Reviewed I have personally reviewed the patient's imaging, laboratory findings and medical records.    Assessment/Plan 28 year old very pleasant female with chronic cholecystitis versus biliary colic.  Symptoms are crescendo and I do think that prompt cholecystectomy is indicated.  Discussed with her in detail about her disease  process and about the need for cholecystectomy.  I do think that she is a good candidate for robotic approach The risks, benefits, complications, treatment options, and expected outcomes were discussed with the patient. The possibilities of bleeding, recurrent infection, finding a normal gallbladder, perforation of viscus organs, damage to surrounding structures, bile leak, abscess formation, needing a drain placed, the need for additional procedures, reaction to medication, pulmonary aspiration,  failure to diagnose a condition, the possible need to convert to an open procedure, and creating a complication requiring transfusion or operation were discussed with the patient. The patient and/or family concurred with the proposed plan, giving informed consent.   I personally spent a total of 60 minutes in the care of the patient today including performing a medically appropriate exam/evaluation, counseling and educating, placing orders, referring and communicating with other health care professionals, documenting clinical information in the EHR, independently interpreting and reviewing images studies and coordinating care.  She wishes to have this done next week  Laneta Luna, MD FACS General Surgeon 11/12/2023, 3:03 PM

## 2023-11-12 NOTE — Patient Instructions (Signed)
 You have requested to have your gallbladder removed. This will be done at [facility] with Dr. Everlene Farrier.  If you are on any injectable weight loss medication, you will need to stop taking your GLP-1 injectable (weight loss) medications 8 days before your surgery to avoid any complications with anesthesia.   You will most likely be out of work 1-2 weeks for this surgery.  If you have FMLA or disability paperwork that needs filled out you may drop this off at our office or this can be faxed to [phone].  You will return after your post-op appointment with a lifting restriction for approximately 4 more weeks.  You will be able to eat anything you would like to following surgery. But, start by eating a bland diet and advance this as tolerated. The Gallbladder diet is below, please go as closely by this diet as possible prior to surgery to avoid any further attacks.  Please see the (blue)pre-care form that you have been given today. Our surgery scheduler will call you to verify surgery date and to go over information.   If you have any questions, please call our office.  Laparoscopic Cholecystectomy Laparoscopic cholecystectomy is surgery to remove the gallbladder. The gallbladder is located in the upper right part of the abdomen, behind the liver. It is a storage sac for bile, which is produced in the liver. Bile aids in the digestion and absorption of fats. Cholecystectomy is often done for inflammation of the gallbladder (cholecystitis). This condition is usually caused by a buildup of gallstones (cholelithiasis) in the gallbladder. Gallstones can block the flow of bile, and that can result in inflammation and pain. In severe cases, emergency surgery may be required. If emergency surgery is not required, you will have time to prepare for the procedure. Laparoscopic surgery is an alternative to open surgery. Laparoscopic surgery has a shorter recovery time. Your common bile duct may also need  to be examined during the procedure. If stones are found in the common bile duct, they may be removed. LET [facility] CARE PROVIDER KNOW ABOUT: Any allergies you have. All medicines you are taking, including vitamins, herbs, eye drops, creams, and over-the-counter medicines. Previous problems you or members of your family have had with the use of anesthetics. Any blood disorders you have. Previous surgeries you have had.  Any medical conditions you have. RISKS AND COMPLICATIONS Generally, this is a safe procedure. However, problems may occur, including: Infection. Bleeding. Allergic reactions to medicines. Damage to other structures or organs. A stone remaining in the common bile duct. A bile leak from the cyst duct that is clipped when your gallbladder is removed. The need to convert to open surgery, which requires a larger incision in the abdomen. This may be necessary if your surgeon thinks that it is not safe to continue with a laparoscopic procedure. BEFORE THE PROCEDURE Ask your health care provider about: Changing or stopping your regular medicines. This is especially important if you are taking diabetes medicines or blood thinners. Taking medicines such as aspirin and ibuprofen. These medicines can thin your blood. Do not take these medicines before your procedure if your health care provider instructs you not to. Follow instructions from your health care provider about eating or drinking restrictions. Let your health care provider know if you develop a cold or an infection before surgery. Plan to have someone take you home after the procedure. Ask your health care provider how your surgical site will be marked or identified. You may  be given antibiotic medicine to help prevent infection. PROCEDURE To reduce your risk of infection: Your health care team will wash or sanitize their hands. Your skin will be washed with soap. An IV tube may be inserted into one of your  veins. You will be given a medicine to make you fall asleep (general anesthetic). A breathing tube will be placed in your mouth. The surgeon will make several small cuts (incisions) in your abdomen. A thin, lighted tube (laparoscope) that has a tiny camera on the end will be inserted through one of the small incisions. The camera on the laparoscope will send a picture to a TV screen (monitor) in the operating room. This will give the surgeon a good view inside your abdomen. A gas will be pumped into your abdomen. This will expand your abdomen to give the surgeon more room to perform the surgery. Other tools that are needed for the procedure will be inserted through the other incisions. The gallbladder will be removed through one of the incisions. After your gallbladder has been removed, the incisions will be closed with stitches (sutures), staples, or skin glue. Your incisions may be covered with a bandage (dressing). The procedure may vary among health care providers and hospitals. AFTER THE PROCEDURE Your blood pressure, heart rate, breathing rate, and blood oxygen level will be monitored often until the medicines you were given have worn off. You will be given medicines as needed to control your pain.   This information is not intended to replace advice given to you by your health care provider. Make sure you discuss any questions you have with your health care provider.   Document Released: 04/17/2005 Document Revised: 01/06/2015 Document Reviewed: 11/27/2012 Elsevier Interactive Patient Education 2016 [employer] Inc[employer]   Low-Fat Diet for Gallbladder Conditions A low-fat diet can be helpful if you have pancreatitis or a gallbladder condition. With these conditions, your pancreas and gallbladder have trouble digesting fats. A healthy eating plan with less fat will help rest your pancreas and gallbladder and reduce your symptoms. WHAT DO I NEED TO KNOW ABOUT THIS DIET? Eat a low-fat  diet. Reduce your fat intake to less than 20-30% of your total daily calories. This is less than 50-60 g of fat per day. Remember that you need some fat in your diet. Ask your dietician what your daily goal should be. Choose nonfat and low-fat healthy foods. Look for the words "nonfat," "low fat," or "fat free." As a guide, look on the label and choose foods with less than 3 g of fat per serving. Eat only one serving. Avoid alcohol. Do not smoke. If you need help quitting, talk with your health care provider. Eat small frequent meals instead of three large heavy meals. WHAT FOODS CAN I EAT? Grains Include healthy grains and starches such as potatoes, wheat bread, fiber-rich cereal, and brown rice. Choose whole grain options whenever possible. In adults, whole grains should account for 45-65% of your daily calories.  Fruits and Vegetables Eat plenty of fruits and vegetables. Fresh fruits and vegetables add fiber to your diet. Meats and Other Protein Sources Eat lean meat such as chicken and pork. Trim any fat off of meat before cooking it. Eggs, fish, and beans are other sources of protein. In adults, these foods should account for 10-35% of your daily calories. Dairy Choose low-fat milk and dairy options. Dairy includes fat and protein, as well as calcium.  Fats and Oils Limit high-fat foods such as fried foods, sweets, baked  goods, sugary drinks.  Other Creamy sauces and condiments, such as mayonnaise, can add extra fat. Think about whether or not you need to use them, or use smaller amounts or low fat options. WHAT FOODS ARE NOT RECOMMENDED? High fat foods, such as: [employer]. Ice cream. [place] toast. Sweet rolls. Pizza. Cheese bread. Foods covered with batter, butter, creamy sauces, or cheese. Fried foods. Sugary drinks and desserts. Foods that cause gas or bloating   This information is not intended to replace advice given to you by your health care provider. Make sure you  discuss any questions you have with your health care provider.   Document Released: 04/22/2013 Document Reviewed: 04/22/2013 Elsevier Interactive Patient Education [employer]

## 2023-11-13 ENCOUNTER — Telehealth: Payer: Self-pay | Admitting: Surgery

## 2023-11-13 NOTE — Telephone Encounter (Signed)
 Left message for pt to callback to schedule an appt for migraines, please review note below

## 2023-11-13 NOTE — Telephone Encounter (Signed)
 Patient has been advised of Pre-Admission date/time, and Surgery date at [facility].  Surgery Date: 11/20/23 Preadmission Testing Date: 11/19/23 (phone 8a-1p)  Patient informed of the scheduling process and surgery information given at time of office visit.  Patient has been made aware to call [phone], between 1-3:00pm the day before surgery, to find out what time to arrive for surgery.

## 2023-11-19 ENCOUNTER — Other Ambulatory Visit: Payer: Self-pay

## 2023-11-19 ENCOUNTER — Encounter
Admission: RE | Admit: 2023-11-19 | Discharge: 2023-11-19 | Disposition: A | Discharge status: Home / Self Care | Source: Ambulatory Visit | Attending: Surgery | Admitting: Surgery

## 2023-11-19 HISTORY — DX: Migraine, unspecified, not intractable, without status migrainosus: G43.909

## 2023-11-19 HISTORY — DX: Calculus of bile duct without cholangitis or cholecystitis without obstruction: K80.50

## 2023-11-19 HISTORY — DX: Mixed hyperlipidemia: E78.2

## 2023-11-19 HISTORY — DX: Family history of other specified conditions: Z84.89

## 2023-11-19 HISTORY — DX: Obesity, unspecified: E66.9

## 2023-11-19 HISTORY — DX: History of uterine scar from previous surgery: Z98.891

## 2023-11-19 NOTE — Patient Instructions (Signed)
 Your procedure is scheduled on:11-20-23 Tuesday Report to the Registration Desk on the 1st floor of the [facility].Then proceed to the 2nd floor Surgery Desk To find out your arrival time, please call [phone] between 1PM - 3PM on:11-19-23 Monday If your arrival time is 6:00 am, do not arrive before that time as the [facility] entrance doors do not open until 6:00 am.  REMEMBER: Instructions that are not followed completely may result in serious medical risk, up to and including death; or upon the discretion of your surgeon and anesthesiologist your surgery may need to be rescheduled.  Do not eat food OR drink liquids after midnight the night before surgery.  No gum chewing or hard candies.  One week prior to surgery:Stop NOW (11-19-23) Stop Anti-inflammatories (NSAIDS) such as Advil , Aleve , Ibuprofen , Motrin , Naproxen , Naprosyn  and Aspirin based products such as Excedrin, Goody's Powder, BC Powder. Stop ANY OVER THE COUNTER supplements until after surgery.  You may however, continue to take Tylenol  if needed for pain up until the day of surgery.  Continue taking all of your other prescription medications up until the day of surgery.  ON THE DAY OF SURGERY ONLY TAKE THESE MEDICATIONS WITH SIPS OF WATER : -loratadine  (CLARITIN )  -omeprazole  (PRILOSEC)   No Alcohol for 24 hours before or after surgery.  No Smoking including e-cigarettes for 24 hours before surgery.  No chewable tobacco products for at least 6 hours before surgery.  No nicotine patches on the day of surgery.  Do not use any recreational drugs for at least a week (preferably 2 weeks) before your surgery.  Please be advised that the combination of cocaine and anesthesia may have negative outcomes, up to and including death. If you test positive for cocaine, your surgery will be cancelled.  On the morning of surgery brush your teeth with toothpaste and water , you may rinse your mouth with mouthwash if you wish. Do  not swallow any toothpaste or mouthwash.  Use CHG Soap as directed on instruction sheet.  Do not wear jewelry, make-up, hairpins, clips or nail polish.  For welded (permanent) jewelry: bracelets, anklets, waist bands, etc.  Please have this removed prior to surgery.  If it is not removed, there is a chance that hospital personnel will need to cut it off on the day of surgery.  Do not wear lotions, powders, or perfumes.   Do not shave body hair from the neck down 48 hours before surgery.  Contact lenses, hearing aids and dentures may not be worn into surgery.  Do not bring valuables to the hospital. [facility] is not responsible for any missing/lost belongings or valuables.   Notify your doctor if there is any change in your medical condition (cold, fever, infection).  Wear comfortable clothing (specific to your surgery type) to the hospital.  After surgery, you can help prevent lung complications by doing breathing exercises.  Take deep breaths and cough every 1-2 hours. Your doctor may order a device called an Incentive Spirometer to help you take deep breaths. When coughing or sneezing, hold a pillow firmly against your incision with both hands. This is called "splinting." Doing this helps protect your incision. It also decreases belly discomfort.  If you are being admitted to the hospital overnight, leave your suitcase in the car. After surgery it may be brought to your room.  In case of increased patient census, it may be necessary for you, the patient, to continue your postoperative care in the Same Day Surgery department.  If you are being discharged the day of surgery, you will not be allowed to drive home. You will need a responsible individual to drive you home and stay with you for 24 hours after surgery.   If you are taking public transportation, you will need to have a responsible individual with you.  Please call the Pre-admissions Testing Dept. at [phone] if you  have any questions about these instructions.  Surgery Visitation Policy:  Patients having surgery or a procedure may have two visitors.  Children under the age of 4 must have an adult with them who is not the patient.     Preparing for Surgery with CHLORHEXIDINE  GLUCONATE (CHG) Soap  Chlorhexidine  Gluconate (CHG) Soap  o An antiseptic cleaner that kills germs and bonds with the skin to continue killing germs even after washing  o Used for showering the night before surgery and morning of surgery  Before surgery, you can play an important role by reducing the number of germs on your skin.  CHG (Chlorhexidine  gluconate) soap is an antiseptic cleanser which kills germs and bonds with the skin to continue killing germs even after washing.  Please do not use if you have an allergy to CHG or antibacterial soaps. If your skin becomes reddened/irritated stop using the CHG.  1. Shower the NIGHT BEFORE SURGERY and the MORNING OF SURGERY with CHG soap.  2. If you choose to wash your hair, wash your hair first as usual with your normal shampoo.  3. After shampooing, rinse your hair and body thoroughly to remove the shampoo.  4. Use CHG as you would any other liquid soap. You can apply CHG directly to the skin and wash gently with a scrungie or a clean washcloth.  5. Apply the CHG soap to your body only from the neck down. Do not use on open wounds or open sores. Avoid contact with your eyes, ears, mouth, and genitals (private parts). Wash face and genitals (private parts) with your normal soap.  6. Wash thoroughly, paying special attention to the area where your surgery will be performed.  7. Thoroughly rinse your body with warm water .  8. Do not shower/wash with your normal soap after using and rinsing off the CHG soap.  9. Pat yourself dry with a clean towel.  10. Wear clean pajamas to bed the night before surgery.  12. Place clean sheets on your bed the night of your first shower  and do not sleep with pets.  13. Shower again with the CHG soap on the day of surgery prior to arriving at the hospital.  14. Do not apply any deodorants/lotions/powders.  15. Please wear clean clothes to the hospital.   [employer] to address health-related social needs:  [email or web address]

## 2023-11-20 ENCOUNTER — Other Ambulatory Visit: Payer: Self-pay

## 2023-11-20 ENCOUNTER — Encounter
Admission: RE | Disposition: A | Discharge status: Home / Self Care | Payer: Self-pay | Source: Home / Self Care | Attending: Surgery

## 2023-11-20 ENCOUNTER — Ambulatory Visit

## 2023-11-20 ENCOUNTER — Encounter: Payer: Self-pay | Admitting: Surgery

## 2023-11-20 ENCOUNTER — Ambulatory Visit
Admission: RE | Admit: 2023-11-20 | Discharge: 2023-11-20 | Disposition: A | Discharge status: Home / Self Care | Attending: Surgery | Admitting: Surgery

## 2023-11-20 DIAGNOSIS — K8020 Calculus of gallbladder without cholecystitis without obstruction: Secondary | ICD-10-CM | POA: Diagnosis not present

## 2023-11-20 DIAGNOSIS — Z01818 Encounter for other preprocedural examination: Secondary | ICD-10-CM

## 2023-11-20 DIAGNOSIS — D649 Anemia, unspecified: Secondary | ICD-10-CM | POA: Insufficient documentation

## 2023-11-20 DIAGNOSIS — K219 Gastro-esophageal reflux disease without esophagitis: Secondary | ICD-10-CM | POA: Insufficient documentation

## 2023-11-20 DIAGNOSIS — Z79899 Other long term (current) drug therapy: Secondary | ICD-10-CM | POA: Diagnosis not present

## 2023-11-20 DIAGNOSIS — K8060 Calculus of gallbladder and bile duct with cholecystitis, unspecified, without obstruction: Secondary | ICD-10-CM | POA: Insufficient documentation

## 2023-11-20 DIAGNOSIS — K8010 Calculus of gallbladder with chronic cholecystitis without obstruction: Secondary | ICD-10-CM

## 2023-11-20 DIAGNOSIS — E782 Mixed hyperlipidemia: Secondary | ICD-10-CM | POA: Diagnosis not present

## 2023-11-20 LAB — POCT PREGNANCY, URINE: Preg Test, Ur: NEGATIVE

## 2023-11-20 SURGERY — CHOLECYSTECTOMY, ROBOT-ASSISTED, LAPAROSCOPIC
Anesthesia: General

## 2023-11-20 MED ORDER — DEXMEDETOMIDINE HCL IN NACL 200 MCG/50ML IV SOLN
INTRAVENOUS | Status: DC | PRN
Start: 2023-11-20 — End: 2023-11-20
  Administered 2023-11-20 (×2): 8 ug via INTRAVENOUS
  Administered 2023-11-20: 4 ug via INTRAVENOUS

## 2023-11-20 MED ORDER — GLYCOPYRROLATE 0.2 MG/ML IJ SOLN
INTRAMUSCULAR | Status: DC | PRN
Start: 2023-11-20 — End: 2023-11-20
  Administered 2023-11-20: .2 mg via INTRAVENOUS

## 2023-11-20 MED ORDER — BUPIVACAINE-EPINEPHRINE (PF) 0.25% -1:200000 IJ SOLN
INTRAMUSCULAR | Status: AC
  Filled 2023-11-20: qty 30

## 2023-11-20 MED ORDER — MIDAZOLAM HCL 2 MG/2ML IJ SOLN
INTRAMUSCULAR | Status: DC | PRN
  Administered 2023-11-20: 2 mg via INTRAVENOUS

## 2023-11-20 MED ORDER — PHENYLEPHRINE HCL-NACL 20-0.9 MG/250ML-% IV SOLN
INTRAVENOUS | Status: AC
Start: 2023-11-20 — End: 2023-11-20
  Filled 2023-11-20: qty 250

## 2023-11-20 MED ORDER — OXYCODONE HCL 5 MG/5ML PO SOLN: 5.0000 mg | Freq: Once | ORAL | Status: AC | PRN

## 2023-11-20 MED ORDER — GABAPENTIN 300 MG PO CAPS
ORAL_CAPSULE | ORAL | Status: AC
  Filled 2023-11-20: qty 1

## 2023-11-20 MED ORDER — FENTANYL CITRATE (PF) 100 MCG/2ML IJ SOLN
25.0000 ug | INTRAMUSCULAR | Status: DC | PRN
  Administered 2023-11-20: 25 ug via INTRAVENOUS

## 2023-11-20 MED ORDER — BUPIVACAINE LIPOSOME 1.3 % IJ SUSP
INTRAMUSCULAR | Status: AC
  Filled 2023-11-20: qty 20

## 2023-11-20 MED ORDER — PROPOFOL 10 MG/ML IV BOLUS
INTRAVENOUS | Status: AC
  Filled 2023-11-20: qty 40

## 2023-11-20 MED ORDER — CHLORHEXIDINE GLUCONATE 0.12 % MT SOLN
OROMUCOSAL | Status: AC
  Filled 2023-11-20: qty 15

## 2023-11-20 MED ORDER — PHENYLEPHRINE 80 MCG/ML (10ML) SYRINGE FOR IV PUSH (FOR BLOOD PRESSURE SUPPORT)
PREFILLED_SYRINGE | INTRAVENOUS | Status: DC | PRN
  Administered 2023-11-20 (×2): 80 ug via INTRAVENOUS

## 2023-11-20 MED ORDER — FENTANYL CITRATE (PF) 100 MCG/2ML IJ SOLN
INTRAMUSCULAR | Status: AC
  Filled 2023-11-20: qty 2

## 2023-11-20 MED ORDER — ACETAMINOPHEN 500 MG PO TABS
ORAL_TABLET | ORAL | Status: AC
Start: 2023-11-20 — End: 2023-11-20
  Filled 2023-11-20: qty 2

## 2023-11-20 MED ORDER — DEXAMETHASONE SODIUM PHOSPHATE 10 MG/ML IJ SOLN
INTRAMUSCULAR | Status: DC | PRN
  Administered 2023-11-20: 10 mg via INTRAVENOUS

## 2023-11-20 MED ORDER — LIDOCAINE HCL (CARDIAC) PF 100 MG/5ML IV SOSY
PREFILLED_SYRINGE | INTRAVENOUS | Status: DC | PRN
  Administered 2023-11-20: 100 mg via INTRAVENOUS

## 2023-11-20 MED ORDER — CHLORHEXIDINE GLUCONATE CLOTH 2 % EX PADS
6.0000 | MEDICATED_PAD | Freq: Once | CUTANEOUS | Status: AC
  Administered 2023-11-20: 6 via TOPICAL

## 2023-11-20 MED ORDER — ONDANSETRON HCL 4 MG/2ML IJ SOLN
INTRAMUSCULAR | Status: DC | PRN
  Administered 2023-11-20 (×2): 4 mg via INTRAVENOUS

## 2023-11-20 MED ORDER — CELECOXIB 200 MG PO CAPS
200.0000 mg | ORAL_CAPSULE | ORAL | Status: AC
  Administered 2023-11-20: 200 mg via ORAL

## 2023-11-20 MED ORDER — BUPIVACAINE LIPOSOME 1.3 % IJ SUSP
INTRAMUSCULAR | Status: DC | PRN
  Administered 2023-11-20: 20 mL

## 2023-11-20 MED ORDER — SUCCINYLCHOLINE CHLORIDE 200 MG/10ML IV SOSY
PREFILLED_SYRINGE | INTRAVENOUS | Status: DC | PRN
  Administered 2023-11-20: 60 mg via INTRAVENOUS

## 2023-11-20 MED ORDER — CEFAZOLIN SODIUM-DEXTROSE 2-4 GM/100ML-% IV SOLN
INTRAVENOUS | Status: AC
  Filled 2023-11-20: qty 100

## 2023-11-20 MED ORDER — DROPERIDOL 2.5 MG/ML IJ SOLN: 0.6250 mg | Freq: Once | INTRAMUSCULAR | Status: DC | PRN

## 2023-11-20 MED ORDER — CHLORHEXIDINE GLUCONATE 0.12 % MT SOLN
15.0000 mL | Freq: Once | OROMUCOSAL | Status: AC
  Administered 2023-11-20: 15 mL via OROMUCOSAL

## 2023-11-20 MED ORDER — OXYCODONE HCL 5 MG PO TABS
5.0000 mg | ORAL_TABLET | Freq: Once | ORAL | Status: AC | PRN
  Administered 2023-11-20: 5 mg via ORAL

## 2023-11-20 MED ORDER — GABAPENTIN 300 MG PO CAPS
300.0000 mg | ORAL_CAPSULE | ORAL | Status: AC
  Administered 2023-11-20: 300 mg via ORAL

## 2023-11-20 MED ORDER — ORAL CARE MOUTH RINSE: 15.0000 mL | Freq: Once | OROMUCOSAL | Status: AC

## 2023-11-20 MED ORDER — PHENYLEPHRINE HCL-NACL 20-0.9 MG/250ML-% IV SOLN
INTRAVENOUS | Status: DC | PRN
Start: 2023-11-20 — End: 2023-11-20
  Administered 2023-11-20: 30 ug/min via INTRAVENOUS

## 2023-11-20 MED ORDER — INDOCYANINE GREEN 25 MG IV SOLR
INTRAVENOUS | Status: AC
  Filled 2023-11-20: qty 10

## 2023-11-20 MED ORDER — ACETAMINOPHEN 500 MG PO TABS
1000.0000 mg | ORAL_TABLET | ORAL | Status: AC
  Administered 2023-11-20: 1000 mg via ORAL

## 2023-11-20 MED ORDER — HYDROCODONE-ACETAMINOPHEN 5-325 MG PO TABS: 1.0000 | ORAL_TABLET | ORAL | 0 refills | Status: DC | PRN

## 2023-11-20 MED ORDER — OXYCODONE HCL 5 MG PO TABS
ORAL_TABLET | ORAL | Status: AC
  Filled 2023-11-20: qty 1

## 2023-11-20 MED ORDER — INDOCYANINE GREEN 25 MG IV SOLR
1.2500 mg | Freq: Once | INTRAVENOUS | Status: AC
  Administered 2023-11-20: 1.25 mg via INTRAVENOUS

## 2023-11-20 MED ORDER — CHLORHEXIDINE GLUCONATE CLOTH 2 % EX PADS: 6.0000 | MEDICATED_PAD | Freq: Once | CUTANEOUS | Status: DC

## 2023-11-20 MED ORDER — ROCURONIUM BROMIDE 100 MG/10ML IV SOLN
INTRAVENOUS | Status: DC | PRN
  Administered 2023-11-20: 20 mg via INTRAVENOUS
  Administered 2023-11-20: 40 mg via INTRAVENOUS

## 2023-11-20 MED ORDER — PROPOFOL 10 MG/ML IV BOLUS
INTRAVENOUS | Status: AC
  Filled 2023-11-20: qty 20

## 2023-11-20 MED ORDER — CELECOXIB 200 MG PO CAPS
ORAL_CAPSULE | ORAL | Status: AC
  Filled 2023-11-20: qty 1

## 2023-11-20 MED ORDER — ACETAMINOPHEN 10 MG/ML IV SOLN: 1000.0000 mg | Freq: Once | INTRAVENOUS | Status: DC | PRN

## 2023-11-20 MED ORDER — FENTANYL CITRATE (PF) 100 MCG/2ML IJ SOLN
INTRAMUSCULAR | Status: DC | PRN
  Administered 2023-11-20 (×2): 50 ug via INTRAVENOUS

## 2023-11-20 MED ORDER — MIDAZOLAM HCL 2 MG/2ML IJ SOLN
INTRAMUSCULAR | Status: AC
  Filled 2023-11-20: qty 2

## 2023-11-20 MED ORDER — PROPOFOL 10 MG/ML IV BOLUS
INTRAVENOUS | Status: DC | PRN
  Administered 2023-11-20: 200 mg via INTRAVENOUS

## 2023-11-20 MED ORDER — SUGAMMADEX SODIUM 200 MG/2ML IV SOLN
INTRAVENOUS | Status: DC | PRN
Start: 2023-11-20 — End: 2023-11-20
  Administered 2023-11-20: 200 mg via INTRAVENOUS

## 2023-11-20 MED ORDER — CEFAZOLIN SODIUM-DEXTROSE 2-4 GM/100ML-% IV SOLN
2.0000 g | INTRAVENOUS | Status: AC
  Administered 2023-11-20: 2 g via INTRAVENOUS

## 2023-11-20 MED ORDER — LACTATED RINGERS IV SOLN: INTRAVENOUS | Status: DC

## 2023-11-20 MED ORDER — BUPIVACAINE-EPINEPHRINE (PF) 0.25% -1:200000 IJ SOLN
INTRAMUSCULAR | Status: DC | PRN
  Administered 2023-11-20: 30 mL

## 2023-11-20 SURGICAL SUPPLY — 37 items
CANNULA REDUCER 12-8 DVNC XI (CANNULA) ×2 IMPLANT
CATH REDDICK CHOLANGI 4FR 50CM (CATHETERS) IMPLANT
CAUTERY HOOK MNPLR 1.6 DVNC XI (INSTRUMENTS) ×2 IMPLANT
CLIP LIGATING HEMO O LOK GREEN (MISCELLANEOUS) ×2 IMPLANT
DEFOGGER SCOPE WARM SEASHARP (MISCELLANEOUS) ×2 IMPLANT
DERMABOND ADVANCED .7 DNX12 (GAUZE/BANDAGES/DRESSINGS) ×2 IMPLANT
DRAPE ARM DVNC X/XI (DISPOSABLE) ×8 IMPLANT
DRAPE COLUMN DVNC XI (DISPOSABLE) ×2 IMPLANT
ELECTRODE REM PT RTRN 9FT ADLT (ELECTROSURGICAL) ×2 IMPLANT
FORCEPS BPLR R/ABLATION 8 DVNC (INSTRUMENTS) ×2 IMPLANT
FORCEPS PROGRASP DVNC XI (FORCEP) ×2 IMPLANT
GLOVE BIO SURGEON STRL SZ7 (GLOVE) ×4 IMPLANT
GOWN STRL REUS W/ TWL LRG LVL3 (GOWN DISPOSABLE) ×8 IMPLANT
IRRIGATION STRYKERFLOW (MISCELLANEOUS) IMPLANT
IV CATH ANGIO 12GX3 LT BLUE (NEEDLE) IMPLANT
KIT PINK PAD W/HEAD ARM REST (MISCELLANEOUS) ×2 IMPLANT
LABEL OR SOLS (LABEL) ×2 IMPLANT
MANIFOLD NEPTUNE II (INSTRUMENTS) ×2 IMPLANT
NDL HYPO 22X1.5 SAFETY MO (MISCELLANEOUS) ×2 IMPLANT
NEEDLE HYPO 22X1.5 SAFETY MO (MISCELLANEOUS) ×2 IMPLANT
NS IRRIG 500ML POUR BTL (IV SOLUTION) ×2 IMPLANT
OBTURATOR OPTICALSTD 8 DVNC (TROCAR) ×2 IMPLANT
PACK LAP CHOLECYSTECTOMY (MISCELLANEOUS) ×2 IMPLANT
SEAL UNIV 5-12 XI (MISCELLANEOUS) ×8 IMPLANT
SET TUBE SMOKE EVAC HIGH FLOW (TUBING) ×2 IMPLANT
SOLUTION ELECTROSURG ANTI STCK (MISCELLANEOUS) ×2 IMPLANT
SPIKE FLUID TRANSFER (MISCELLANEOUS) ×2 IMPLANT
SPONGE T-LAP 18X18 ~~LOC~~+RFID (SPONGE) ×2 IMPLANT
SPONGE T-LAP 4X18 ~~LOC~~+RFID (SPONGE) IMPLANT
STOPCOCK 3WAY MALE LL (IV SETS) IMPLANT
SUT MNCRL AB 4-0 PS2 18 (SUTURE) ×2 IMPLANT
SUT VICRYL 0 UR6 27IN ABS (SUTURE) ×4 IMPLANT
SYR 20ML LL LF (SYRINGE) IMPLANT
SYSTEM BAG RETRIEVAL 10MM (BASKET) ×2 IMPLANT
TRAP FLUID SMOKE EVACUATOR (MISCELLANEOUS) ×2 IMPLANT
WATER STERILE IRR 3000ML UROMA (IV SOLUTION) IMPLANT
WATER STERILE IRR 500ML POUR (IV SOLUTION) ×2 IMPLANT

## 2023-11-20 NOTE — Anesthesia Postprocedure Evaluation (Signed)
 Anesthesia Post Note  Patient: CAMREIGH MICHIE  Procedure(s) Performed: CHOLECYSTECTOMY, ROBOT-ASSISTED, LAPAROSCOPIC INDOCYANINE GREEN  FLUORESCENCE IMAGING (ICG)  Patient location during evaluation: PACU Anesthesia Type: General Level of consciousness: awake and alert Pain management: pain level controlled Vital Signs Assessment: post-procedure vital signs reviewed and stable Respiratory status: spontaneous breathing, nonlabored ventilation and respiratory function stable Cardiovascular status: blood pressure returned to baseline and stable Postop Assessment: no apparent nausea or vomiting Anesthetic complications: no   No notable events documented.   Last Vitals:  Vitals:   11/20/23 0915 11/20/23 0927  BP: 124/82 130/82  Pulse: 95 91  Resp: 18 14  Temp:  37.1 C  SpO2: 97% 92%    Last Pain:  Vitals:   11/20/23 0927  TempSrc: Temporal  PainSc: 4                  Camellia Merilee Louder

## 2023-11-20 NOTE — Anesthesia Procedure Notes (Signed)
 Procedure Name: Intubation Date/Time: 11/20/2023 7:36 AM  Performed by: Ledora Duncan, CRNAPre-anesthesia Checklist: Patient identified, Emergency Drugs available, Suction available and Patient being monitored Patient Re-evaluated:Patient Re-evaluated prior to induction Oxygen Delivery Method: Circle system utilized Preoxygenation: Pre-oxygenation with 100% oxygen Induction Type: IV induction Ventilation: Mask ventilation without difficulty Laryngoscope Size: McGrath and 3 Grade View: Grade I Tube type: Oral Tube size: 7.0 mm Number of attempts: 1 Airway Equipment and Method: Stylet and Video-laryngoscopy Placement Confirmation: ETT inserted through vocal cords under direct vision, positive ETCO2 and breath sounds checked- equal and bilateral Secured at: 21 cm Tube secured with: Tape Dental Injury: Teeth and Oropharynx as per pre-operative assessment

## 2023-11-20 NOTE — Interval H&P Note (Signed)
 History and Physical Interval Note:  11/20/2023 7:23 AM  Martha Herrera  has presented today for surgery, with the diagnosis of Biliary colic.  The various methods of treatment have been discussed with the patient and family. After consideration of risks, benefits and other options for treatment, the patient has consented to  Procedure(s) with comments: CHOLECYSTECTOMY, ROBOT-ASSISTED, LAPAROSCOPIC (N/A) - w/ICG INDOCYANINE GREEN  FLUORESCENCE IMAGING (ICG) as a surgical intervention.  The patient's history has been reviewed, patient examined, no change in status, stable for surgery.  I have reviewed the patient's chart and labs.  Questions were answered to the patient's satisfaction.     Martha Herrera

## 2023-11-20 NOTE — Discharge Instructions (Signed)

## 2023-11-20 NOTE — Anesthesia Preprocedure Evaluation (Addendum)
 Anesthesia Evaluation  Patient identified by MRN, date of birth, ID band Patient awake    Reviewed: Allergy & Precautions, H&P , NPO status , Patient's Chart, lab work & pertinent test results  Airway Mallampati: III  TM Distance: >3 FB Neck ROM: full    Dental no notable dental hx.    Pulmonary neg pulmonary ROS   Pulmonary exam normal        Cardiovascular negative cardio ROS Normal cardiovascular exam     Neuro/Psych  Headaches  negative psych ROS   GI/Hepatic Neg liver ROS,GERD  Controlled and Medicated,,  Endo/Other  negative endocrine ROS    Renal/GU      Musculoskeletal   Abdominal  (+) + obese  Peds  Hematology negative hematology ROS (+)   Anesthesia Other Findings Past Medical History: No date: Biliary colic No date: Complication of anesthesia     Comment:  delayed emergence No date: Dysmenorrhea No date: Family history of adverse reaction to anesthesia     Comment:  delayed emergence-dad and sister No date: Gastroesophageal reflux     Comment:  takes prescribed meds daily No date: Gestational diabetes No date: Heart murmur No date: History of cesarean delivery No date: Migraine No date: Mixed hyperlipidemia No date: Obesity 2013: Pneumonia No date: Rubella non-immune status, antepartum No date: Tinea capitis No date: Varicella  Past Surgical History: No date: ADENOIDECTOMY 08/07/2018: CESAREAN SECTION; N/A     Comment:  Procedure: CESAREAN SECTION;  Surgeon: Jayne Vonn DEL,               MD;  Location: [facility] LD ORS;  Service: Obstetrics;                Laterality: N/A; 01/20/2021: CESAREAN SECTION; N/A     Comment:  Procedure: CESAREAN SECTION;  Surgeon: Kandis Devaughn Sayres, MD;  Location: [facility] ORS;  Service: Obstetrics;               Laterality: N/A; 01/20/2021: CESAREAN SECTION WITH BILATERAL TUBAL LIGATION; Bilateral 05/03/2012: ESOPHAGOGASTRODUODENOSCOPY     Comment:   Procedure: ESOPHAGOGASTRODUODENOSCOPY (EGD);  Surgeon:               Fairy DEL Gaskins, MD;  Location: [facility];  Service:               Gastroenterology;  Laterality: N/A; 12/15/2021: ESOPHAGOGASTRODUODENOSCOPY; N/A     Comment:  Procedure: ESOPHAGOGASTRODUODENOSCOPY (EGD);  Surgeon:               Tye Millet, DO;  Location: [facility] ENDOSCOPY;  Service:               General;  Laterality: N/A; No date: tubes in ears No date: WISDOM TOOTH EXTRACTION     Reproductive/Obstetrics negative OB ROS                              Anesthesia Physical Anesthesia Plan  ASA: 3  Anesthesia Plan: General ETT   Post-op Pain Management: Toradol  IV (intra-op)* and Ofirmev  IV (intra-op)*   Induction: Intravenous  PONV Risk Score and Plan: 4 or greater and Ondansetron , Dexamethasone , Midazolam  and Propofol  infusion  Airway Management Planned: Oral ETT  Additional Equipment:   Intra-op Plan:   Post-operative Plan: Extubation in OR  Informed Consent: I have reviewed the patients History and Physical, chart, labs and discussed the  procedure including the risks, benefits and alternatives for the proposed anesthesia with the patient or authorized representative who has indicated his/her understanding and acceptance.     Dental Advisory Given  Plan Discussed with: CRNA and Surgeon  Anesthesia Plan Comments:          Anesthesia Quick Evaluation

## 2023-11-20 NOTE — Transfer of Care (Signed)
 Immediate Anesthesia Transfer of Care Note  Patient: Martha Herrera  Procedure(s) Performed: CHOLECYSTECTOMY, ROBOT-ASSISTED, LAPAROSCOPIC INDOCYANINE GREEN  FLUORESCENCE IMAGING (ICG)  Patient Location: PACU  Anesthesia Type:General  Level of Consciousness: awake, drowsy, and patient cooperative  Airway & Oxygen Therapy: Patient Spontanous Breathing and Patient connected to face mask oxygen  Post-op Assessment: Report given to RN and Post -op Vital signs reviewed and stable  Post vital signs: Reviewed and stable  Last Vitals:  Vitals Value Taken Time  BP 120/81 11/20/23 08:36  Temp    Pulse 98 11/20/23 08:38  Resp 26 11/20/23 08:38  SpO2 100 % 11/20/23 08:38  Vitals shown include unfiled device data.  Last Pain:  Vitals:   11/20/23 0628  TempSrc:   PainSc: 3          Complications: No notable events documented.

## 2023-11-20 NOTE — Op Note (Signed)
Robotic assisted laparoscopic Cholecystectomy  Pre-operative Diagnosis: biliary colic  Post-operative Diagnosis: same  Procedure:  Robotic assisted laparoscopic Cholecystectomy  Surgeon: Caroleen Hamman, MD FACS  Anesthesia: Gen. with endotracheal tube  Findings: Chronic mild Cholecystitis   Estimated Blood Loss: 5cc       Specimens: Gallbladder           Complications: none   Procedure Details  The patient was seen again in the Holding Room. The benefits, complications, treatment options, and expected outcomes were discussed with the patient. The risks of bleeding, infection, recurrence of symptoms, failure to resolve symptoms, bile duct damage, bile duct leak, retained common bile duct stone, bowel injury, any of which could require further surgery and/or ERCP, stent, or papillotomy were reviewed with the patient. The likelihood of improving the patient's symptoms with return to their baseline status is good.  The patient and/or family concurred with the proposed plan, giving informed consent.  The patient was taken to Operating Room, identified  and the procedure verified as Laparoscopic Cholecystectomy.  A Time Out was held and the above information confirmed.  Prior to the induction of general anesthesia, antibiotic prophylaxis was administered. VTE prophylaxis was in place. General endotracheal anesthesia was then administered and tolerated well. After the induction, the abdomen was prepped with Chloraprep and draped in the sterile fashion. The patient was positioned in the supine position.  Cut down technique was used to enter the abdominal cavity and a Hasson trochar was placed after two vicryl stitches were anchored to the fascia. Pneumoperitoneum was then created with CO2 and tolerated well without any adverse changes in the patient's vital signs.  Three 8-mm ports were placed under direct vision. All skin incisions  were infiltrated with a local anesthetic agent before making the  incision and placing the trocars.   The patient was positioned  in reverse Trendelenburg, robot was brought to the surgical field and docked in the standard fashion.  We made sure all the instrumentation was kept indirect view at all times and that there were no collision between the arms. I scrubbed out and went to the console.  The gallbladder was identified, the fundus grasped and retracted cephalad. Adhesions were lysed bluntly. The infundibulum was grasped and retracted laterally, exposing the peritoneum overlying the triangle of Calot. This was then divided and exposed in a blunt fashion. An extended critical view of the cystic duct and cystic artery was obtained.  The cystic duct was clearly identified and bluntly dissected.   Artery and duct were double clipped and divided. Using ICG cholangiography we visualize the cystic duct and CBD w/o evidence of bile injuries. The gallbladder was taken from the gallbladder fossa in a retrograde fashion with the electrocautery.  Hemostasis was achieved with the electrocautery. nspection of the right upper quadrant was performed. No bleeding, bile duct injury or leak, or bowel injury was noted. Robotic instruments and robotic arms were undocked in the standard fashion.  I scrubbed back in.  The gallbladder was removed and placed in an Endocatch bag.   Pneumoperitoneum was released.  The periumbilical port site was closed with interrumpted 0 Vicryl sutures. 4-0 subcuticular Monocryl was used to close the skin. Dermabond was  applied.  The patient was then extubated and brought to the recovery room in stable condition. Sponge, lap, and needle counts were correct at closure and at the conclusion of the case.               Caroleen Hamman, MD, FACS

## 2023-11-21 ENCOUNTER — Encounter: Payer: Self-pay | Admitting: Surgery

## 2023-11-21 LAB — SURGICAL PATHOLOGY

## 2023-11-24 ENCOUNTER — Other Ambulatory Visit: Payer: Self-pay

## 2023-11-24 ENCOUNTER — Emergency Department
Admission: EM | Admit: 2023-11-24 | Discharge: 2023-11-24 | Disposition: A | Discharge status: Home / Self Care | Attending: Emergency Medicine | Admitting: Emergency Medicine

## 2023-11-24 DIAGNOSIS — L299 Pruritus, unspecified: Secondary | ICD-10-CM | POA: Diagnosis not present

## 2023-11-24 LAB — CBC WITH DIFFERENTIAL/PLATELET
Abs Immature Granulocytes: 0.08 K/uL — ABNORMAL HIGH (ref 0.00–0.07)
Basophils Absolute: 0.1 K/uL (ref 0.0–0.1)
Basophils Relative: 1 %
Eosinophils Absolute: 0.1 K/uL (ref 0.0–0.5)
Eosinophils Relative: 1 %
HCT: 37.1 % (ref 36.0–46.0)
Hemoglobin: 11.8 g/dL — ABNORMAL LOW (ref 12.0–15.0)
Immature Granulocytes: 1 %
Lymphocytes Relative: 30 %
Lymphs Abs: 3.3 K/uL (ref 0.7–4.0)
MCH: 24.8 pg — ABNORMAL LOW (ref 26.0–34.0)
MCHC: 31.8 g/dL (ref 30.0–36.0)
MCV: 77.9 fL — ABNORMAL LOW (ref 80.0–100.0)
Monocytes Absolute: 0.7 K/uL (ref 0.1–1.0)
Monocytes Relative: 6 %
Neutro Abs: 6.8 K/uL (ref 1.7–7.7)
Neutrophils Relative %: 61 %
Platelets: 338 K/uL (ref 150–400)
RBC: 4.76 MIL/uL (ref 3.87–5.11)
RDW: 13.9 % (ref 11.5–15.5)
WBC: 11 K/uL — ABNORMAL HIGH (ref 4.0–10.5)
nRBC: 0 % (ref 0.0–0.2)

## 2023-11-24 LAB — COMPREHENSIVE METABOLIC PANEL WITH GFR
ALT: 29 U/L (ref 0–44)
AST: 15 U/L (ref 15–41)
Albumin: 3.9 g/dL (ref 3.5–5.0)
Alkaline Phosphatase: 82 U/L (ref 38–126)
Anion gap: 7 (ref 5–15)
BUN: 18 mg/dL (ref 6–20)
CO2: 26 mmol/L (ref 22–32)
Calcium: 9.3 mg/dL (ref 8.9–10.3)
Chloride: 102 mmol/L (ref 98–111)
Creatinine, Ser: 0.66 mg/dL (ref 0.44–1.00)
GFR, Estimated: >60 mL/min (ref >60)
Glucose, Bld: 102 mg/dL — ABNORMAL HIGH (ref 70–99)
Potassium: 3.4 mmol/L — ABNORMAL LOW (ref 3.5–5.1)
Sodium: 135 mmol/L (ref 135–145)
Total Bilirubin: 0.5 mg/dL (ref 0.0–1.2)
Total Protein: 7.5 g/dL (ref 6.5–8.1)

## 2023-11-24 NOTE — ED Provider Notes (Signed)
 [facility] Provider Note    None    (approximate)   History   Skin Itching   HPI  Martha Herrera is a 28 y.o. female   Past medical history of 4 days out from  cholecystectomy with whole body skin itching without rash or exposure.  Her wounds are healing well from her surgery and she has some residual but improving pain to her surgical incision sites and to her right upper quadrant.  She denies nausea or vomiting.  She denies new medications  She has no respiratory symptoms or GI symptoms.  Independent Historian contributed to assessment above: Spouse at bedside corroborates information past medical history    External Medical Documents Reviewed: Surgery notes from a few days ago from cholecystectomy      Physical Exam   Triage Vital Signs: ED Triage Vitals  Encounter Vitals Group     BP 11/24/23 0317 126/85     Girls Systolic BP Percentile --      Girls Diastolic BP Percentile --      Boys Systolic BP Percentile --      Boys Diastolic BP Percentile --      Pulse Rate 11/24/23 0317 84     Resp 11/24/23 0317 17     Temp 11/24/23 0317 98.1 F (36.7 C)     Temp Source 11/24/23 0317 Oral     SpO2 11/24/23 0317 98 %     Weight 11/24/23 0317 210 lb (95.3 kg)     Height 11/24/23 0317 4' 10 (1.473 m)     Head Circumference --      Peak Flow --      Pain Score 11/24/23 0328 0     Pain Loc --      Pain Education --      Exclude from Growth Chart --     Most recent vital signs: Vitals:   11/24/23 0317 11/24/23 0638  BP: 126/85 134/89  Pulse: 84 77  Resp: 17 16  Temp: 98.1 F (36.7 C) 98.5 F (36.9 C)  SpO2: 98% 99%    General: Awake, no distress.  CV:  Good peripheral perfusion.  Resp:  Normal effort.  Abd:  No distention.  Other:  Well-healed noninfected surgical scars.  Overall benign abdominal exam with no rigidity or guarding, nonperitoneal, mild right upper quadrant tenderness.   ED Results / Procedures / Treatments    Labs (all labs ordered are listed, but only abnormal results are displayed) Labs Reviewed  COMPREHENSIVE METABOLIC PANEL WITH GFR - Abnormal; Notable for the following components:      Result Value   Potassium 3.4 (*)    Glucose, Bld 102 (*)    All other components within normal limits  CBC WITH DIFFERENTIAL/PLATELET - Abnormal; Notable for the following components:   WBC 11.0 (*)    Hemoglobin 11.8 (*)    MCV 77.9 (*)    MCH 24.8 (*)    Abs Immature Granulocytes 0.08 (*)    All other components within normal limits     I ordered and reviewed the above labs they are notable for LFTs and bilirubin alk phos unremarkable  PROCEDURES:  Critical Care performed: No  Procedures   MEDICATIONS ORDERED IN ED: Medications - No data to display IMPRESSION / MDM / ASSESSMENT AND PLAN / ED COURSE  I reviewed the triage vital signs and the nursing notes.  Patient's presentation is most consistent with acute presentation with potential threat to life or bodily function.  Differential diagnosis includes, but is not limited to, intra-abdominal infection, cholestasis, choledocholithiasis, allergic.   The patient is on the cardiac monitor to evaluate for evidence of arrhythmia and/or significant heart rate changes.  MDM:    Her chief complaint is whole body itching without jaundice or scleral icterus and her mentation is sharp, and her surgical wounds have been healing well with no increase in pain nausea or vomiting.  I wonder if she is suffering from cholestasis with retained stone but her alk phos, bilirubin and LFTs are completely normal her abdominal exam is relatively benign.  She has no obvious new exposures to suggest allergic reaction or no hives to suggest urticaria.  She has not tried any medication so I suggested some antihistamine to try at home.  Given her overall well appearance, I doubt life-threatening pathologies at this time and she can  follow-up with her PMD and surgeon as an outpatient.        FINAL CLINICAL IMPRESSION(S) / ED DIAGNOSES   Final diagnoses:  Itching     Rx / DC Orders   ED Discharge Orders     None        Note:  This document was prepared using Dragon voice recognition software and may include unintentional dictation errors.    Cyrena Mylar, MD 11/24/23 [phone]

## 2023-11-24 NOTE — Discharge Instructions (Signed)
 Try taking cetirizine 5 or 10 mg twice daily to help with itching.  Fortunately your lab test and exam do not show any signs of infection, or cholestasis of involved with your recent gallbladder removal.  Talk to your surgeon about follow-up as needed.  Thank you for choosing us  for your health care today!  Please see your primary doctor this week for a follow up appointment.   If you have any new, worsening, or unexpected symptoms call your doctor right away or come back to the emergency department for reevaluation.  It was my pleasure to care for you today.   Ginnie EDISON Cyrena, MD

## 2023-11-24 NOTE — ED Triage Notes (Signed)
 4 days post operative for a cholecystitis.  Beginning Thursday after taking a shower she began having severe generalized skin itching.   Denies any new products. No new medications. No obvious urticaria.

## 2023-11-27 DIAGNOSIS — R21 Rash and other nonspecific skin eruption: Secondary | ICD-10-CM | POA: Diagnosis not present

## 2023-12-05 ENCOUNTER — Ambulatory Visit (INDEPENDENT_AMBULATORY_CARE_PROVIDER_SITE_OTHER): Admitting: Surgery

## 2023-12-05 ENCOUNTER — Encounter: Payer: Self-pay | Admitting: Surgery

## 2023-12-05 VITALS — BP 139/101 | HR 84 | Temp 98.6°F | Ht <= 58 in | Wt 210.0 lb

## 2023-12-05 DIAGNOSIS — K8010 Calculus of gallbladder with chronic cholecystitis without obstruction: Secondary | ICD-10-CM

## 2023-12-05 DIAGNOSIS — Z09 Encounter for follow-up examination after completed treatment for conditions other than malignant neoplasm: Secondary | ICD-10-CM

## 2023-12-05 NOTE — Progress Notes (Signed)
 S/p robo chole DOing well No issues  PE NAD Abd: soft, incision c/d/I, no infection  A/P DOing well w/o complications RTC prn

## 2023-12-05 NOTE — Patient Instructions (Signed)

## 2024-02-24 DIAGNOSIS — G43019 Migraine without aura, intractable, without status migrainosus: Secondary | ICD-10-CM | POA: Diagnosis not present

## 2024-03-19 ENCOUNTER — Ambulatory Visit: Admitting: Family Medicine

## 2024-03-19 ENCOUNTER — Encounter: Payer: Self-pay | Admitting: Family Medicine

## 2024-03-19 ENCOUNTER — Telehealth: Admitting: Family Medicine

## 2024-03-19 DIAGNOSIS — G43009 Migraine without aura, not intractable, without status migrainosus: Secondary | ICD-10-CM | POA: Diagnosis not present

## 2024-03-19 DIAGNOSIS — F419 Anxiety disorder, unspecified: Secondary | ICD-10-CM | POA: Diagnosis not present

## 2024-03-19 DIAGNOSIS — F330 Major depressive disorder, recurrent, mild: Secondary | ICD-10-CM | POA: Diagnosis not present

## 2024-03-19 DIAGNOSIS — J3089 Other allergic rhinitis: Secondary | ICD-10-CM | POA: Diagnosis not present

## 2024-03-19 MED ORDER — VENLAFAXINE HCL ER 37.5 MG PO CP24: 37.5000 mg | ORAL_CAPSULE | Freq: Every day | ORAL | 0 refills | Status: DC

## 2024-03-19 NOTE — Progress Notes (Signed)
 MyChart Video Visit    Virtual Visit via Video Note   This format is felt to be most appropriate for this patient at this time. Physical exam was limited by quality of the video and audio technology used for the visit.   Patient location: in [facility] Provider location: [facility]  I discussed the limitations of evaluation and management by telemedicine and the availability of in person appointments. The patient expressed understanding and agreed to proceed.  Patient: Martha Herrera   DOB: 1996/01/17   28 y.o. Female  MRN: [id number] Visit Date: 03/19/2024  Today's healthcare provider: LAURAINE LOISE BUOY, DO   No chief complaint on file.  Subjective    HPI  KASHENA NOVITSKI is a 28 year old female with migraines and anxiety who presents with headaches and anxiety symptoms.  She experiences migraines approximately three times a week, which have decreased in frequency since obtaining new glasses. The migraines are primarily ocular as well as across her frontal region, starting in the morning and lasting all day. She associates the onset with lack of sleep due to nighttime anxiety. She has been using sumatriptan , but it has not been effective recently. She has previously tried naproxen  and propranolol , with the former not providing relief and uncertainty regarding benefit of the latter. She also received two samples of Ubrelvy  but did not notice significant effects due to limited use.  She experiences anxiety nearly every day, with symptoms including feeling nervous, inability to control worrying, and trouble relaxing, especially at night. She is easily annoyed and irritable, feels overstimulated, and has difficulty with family interactions. No thoughts of self-harm. She has a history of being prescribed sertraline  in 2020 but did not take it due to anxiety about potential side effects. Her responsibilities have increased with school and family, contributing to her anxiety.  She deals  with allergies year-round, alternating between Claritin  and Zyrtec to manage symptoms. Continuous use of Claritin  leads to reduced effectiveness, prompting her to switch to Zyrtec when symptoms like stuffiness and itchiness increase.    Medications: Outpatient Medications Prior to Visit  Medication Sig   loratadine  (CLARITIN ) 10 MG tablet Take 10 mg by mouth in the morning.   omeprazole  (PRILOSEC) 40 MG capsule Take 1 capsule (40 mg total) by mouth daily. (Patient taking differently: Take 40 mg by mouth every morning.)   SUMAtriptan  (IMITREX ) 25 MG tablet Take 1 tablet (25 mg total) by mouth every 2 (two) hours as needed for migraine. May repeat in 2 hours if headache persists or recurs. (Max 2 doses per 24 hours)   No facility-administered medications prior to visit.        Objective    There were no vitals taken for this visit.     Physical Exam Constitutional:      General: She is not in acute distress.    Appearance: Normal appearance.  HENT:     Head: Normocephalic.  Pulmonary:     Effort: Pulmonary effort is normal. No respiratory distress.  Neurological:     Mental Status: She is alert and oriented to person, place, and time. Mental status is at baseline.       Assessment & Plan    Migraine without aura and without status migrainosus, not intractable -     Venlafaxine HCl ER; Take 1 capsule (37.5 mg total) by mouth daily with breakfast.  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  Dispense: 90 capsule; Refill: 0  Perennial allergic rhinitis     Migraine without aura and without status migrainosus, not intractable; recurrent mild major depressive disorder with anxiety Chronic migraines three times weekly, ineffective sumatriptan  (low dose) and propranolol .  Migraine prevention  Referred by patient.  Venlafaxine considered for  migraine, anxiety, and depression management. Anxiety and depression symptoms present, no suicidal ideation. - Prescribed venlafaxine for migraine prevention and management of anxiety and depression. - Initiated venlafaxine XR 37.5 mg daily with a 90-day supply - Scheduled follow-up in 6-8 20 weeks to assess efficacy and consider dose adjustment.  Perennial allergic rhinitis Chronic symptoms year-round, Claritin  less effective than Zyrtec.  Allergies may also contribute to headaches given frontal and periocular focus of discomfort. - Switch from Claritin  to Zyrtec for better symptom management.    Return in about 7 weeks (around 05/05/2024) for Anx/Dep, migraine.     I discussed the assessment and treatment plan with the patient. The patient was provided an opportunity to ask questions and all were answered. The patient agreed with the plan and demonstrated an understanding of the instructions.   The patient was advised to call back or seek an in-person evaluation if the symptoms worsen or if the condition fails to improve as anticipated.  I provided 15 minutes of virtual-face-to-face time during this encounter.   LAURAINE LOISE BUOY, DO [facility] [phone] (phone) [phone] (fax)  [facility]

## 2024-05-30 ENCOUNTER — Telehealth: Admitting: Family Medicine

## 2024-05-30 ENCOUNTER — Encounter: Payer: Self-pay | Admitting: Family Medicine

## 2024-05-30 DIAGNOSIS — G43009 Migraine without aura, not intractable, without status migrainosus: Secondary | ICD-10-CM

## 2024-05-30 DIAGNOSIS — K2100 Gastro-esophageal reflux disease with esophagitis, without bleeding: Secondary | ICD-10-CM

## 2024-05-30 DIAGNOSIS — F419 Anxiety disorder, unspecified: Secondary | ICD-10-CM

## 2024-05-30 DIAGNOSIS — F330 Major depressive disorder, recurrent, mild: Secondary | ICD-10-CM | POA: Diagnosis not present

## 2024-05-30 DIAGNOSIS — E782 Mixed hyperlipidemia: Secondary | ICD-10-CM

## 2024-05-30 MED ORDER — VENLAFAXINE HCL ER 37.5 MG PO CP24: 37.5000 mg | ORAL_CAPSULE | Freq: Every day | ORAL | 1 refills | Status: AC

## 2024-05-30 MED ORDER — OMEPRAZOLE 40 MG PO CPDR: 40.0000 mg | DELAYED_RELEASE_CAPSULE | ORAL | 3 refills | Status: AC

## 2024-05-30 NOTE — Progress Notes (Signed)
 "   MyChart Video Visit    Virtual Visit via Video Note   This format is felt to be most appropriate for this patient at this time. Physical exam was limited by quality of the video and audio technology used for the visit.   Patient location: Home in [place]  Provider location: [facility]  I discussed the limitations of evaluation and management by telemedicine and the availability of in person appointments. The patient expressed understanding and agreed to proceed.  Patient: Martha Herrera   DOB: 1995-11-12   29 y.o. Female  MRN: [id number] Visit Date: 05/30/2024  Today's healthcare provider: LAURAINE LOISE BUOY, DO   No chief complaint on file.  Subjective    HPI  Martha Herrera is a 29 year old female who presents for a follow-up regarding her medication management.  She has been taking venlafaxine  and omeprazole  regularly. She is out of and has not needed sumatriptan  for her migraines; she manages her migraines with Tylenol , experiencing approximately five migraines since starting the venlafaxine  in November .  Since starting the venlafaxine , her mood has improved significantly, and she feels calmer and more aware, positively impacting her daily interactions. She is satisfied with her current medication dosage for anxiety.  She takes omeprazole  daily and notices a significant difference if she misses a dose or takes it late.    Medications: Show/hide medication list[1]       Objective    There were no vitals taken for this visit.     Physical Exam Constitutional:      General: She is not in acute distress.    Appearance: Normal appearance.  HENT:     Head: Normocephalic.  Pulmonary:     Effort: Pulmonary effort is normal. No respiratory distress.  Neurological:     Mental Status: She is alert and oriented to person, place, and time. Mental status is at baseline.       Assessment & Plan    Migraine without aura and without  status migrainosus, not intractable -     Venlafaxine  HCl ER; Take 1 capsule (37.5 mg total) by mouth daily with breakfast.  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  Dispense: 90 capsule; Refill: 1  Gastroesophageal reflux disease with esophagitis without hemorrhage -     Omeprazole ; Take 1 capsule (40 mg total) by mouth every morning.  Dispense: 90 capsule; Refill: 3     Migraine without aura and without status migrainosus, not intractable Experiencing five migraines monthly, effectively managed with Tylenol  and venlafaxine . No need for sumatriptan . - Continue Tylenol  and venlafaxine  as prescribed.  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 Reports feeling calmer and more aware, indicating effective management. - Continue current dose of venlafaxine  unchanged.  Gastroesophageal reflux disease with esophagitis without hemorrhage Omeprazole  taken daily with noticeable effects if missed. - Continue omeprazole  daily as prescribed.    Return in about 2 months (around 07/28/2024) for CPE w/Dr. Buoy or next provider per pt preference.     I discussed the assessment and treatment plan with the patient. The patient was provided an opportunity to ask questions and all were answered. The patient agreed with the plan and demonstrated an understanding of the instructions.   The patient was advised to call back or seek an in-person evaluation if  the symptoms worsen or if the condition fails to improve as anticipated.  I provided 9 minutes of virtual-face-to-face time during this encounter.   LAURAINE LOISE BUOY, DO [facility] [phone] (phone) [phone] (fax)  [facility]      [1]  Outpatient Medications Prior to Visit  Medication Sig   loratadine   (CLARITIN ) 10 MG tablet Take 10 mg by mouth in the morning.   SUMAtriptan  (IMITREX ) 25 MG tablet Take 1 tablet (25 mg total) by mouth every 2 (two) hours as needed for migraine. May repeat in 2 hours if headache persists or recurs. (Max 2 doses per 24 hours)   [DISCONTINUED] omeprazole  (PRILOSEC) 40 MG capsule Take 1 capsule (40 mg total) by mouth daily. (Patient taking differently: Take 40 mg by mouth every morning.)   [DISCONTINUED] venlafaxine  XR (EFFEXOR  XR) 37.5 MG 24 hr capsule Take 1 capsule (37.5 mg total) by mouth daily with breakfast.   No facility-administered medications prior to visit.   "
